# Patient Record
Sex: Female | Born: 1980 | Race: White | Hispanic: No | Marital: Single | Smoking: Former smoker
Health system: Southern US, Community
[De-identification: ages and names within clinical notes are randomized; demographics above are authoritative.]

## PROBLEM LIST (undated history)

## (undated) DIAGNOSIS — J302 Other seasonal allergic rhinitis: Secondary | ICD-10-CM

## (undated) DIAGNOSIS — J329 Chronic sinusitis, unspecified: Secondary | ICD-10-CM

## (undated) DIAGNOSIS — G8929 Other chronic pain: Secondary | ICD-10-CM

## (undated) DIAGNOSIS — N809 Endometriosis, unspecified: Secondary | ICD-10-CM

## (undated) DIAGNOSIS — E079 Disorder of thyroid, unspecified: Secondary | ICD-10-CM

## (undated) DIAGNOSIS — F419 Anxiety disorder, unspecified: Secondary | ICD-10-CM

## (undated) DIAGNOSIS — G4700 Insomnia, unspecified: Secondary | ICD-10-CM

## (undated) DIAGNOSIS — N3010 Interstitial cystitis (chronic) without hematuria: Secondary | ICD-10-CM

## (undated) DIAGNOSIS — I10 Essential (primary) hypertension: Secondary | ICD-10-CM

## (undated) DIAGNOSIS — F99 Mental disorder, not otherwise specified: Secondary | ICD-10-CM

## (undated) DIAGNOSIS — K219 Gastro-esophageal reflux disease without esophagitis: Secondary | ICD-10-CM

## (undated) DIAGNOSIS — F319 Bipolar disorder, unspecified: Secondary | ICD-10-CM

## (undated) DIAGNOSIS — F4310 Post-traumatic stress disorder, unspecified: Secondary | ICD-10-CM

## (undated) DIAGNOSIS — K589 Irritable bowel syndrome without diarrhea: Secondary | ICD-10-CM

## (undated) DIAGNOSIS — N643 Galactorrhea not associated with childbirth: Secondary | ICD-10-CM

## (undated) DIAGNOSIS — M549 Dorsalgia, unspecified: Secondary | ICD-10-CM

## (undated) HISTORY — DX: Irritable bowel syndrome, unspecified: K58.9

## (undated) HISTORY — PX: TUBAL LIGATION: SHX77

## (undated) HISTORY — PX: UPPER GI ENDOSCOPY: SHX6162

## (undated) HISTORY — PX: BUNIONECTOMY: SHX129

## (undated) HISTORY — DX: Endometriosis, unspecified: N80.9

## (undated) HISTORY — PX: ABDOMINAL SURGERY: SHX537

## (undated) HISTORY — DX: Post-traumatic stress disorder, unspecified: F43.10

## (undated) HISTORY — DX: Interstitial cystitis (chronic) without hematuria: N30.10

## (undated) HISTORY — PX: INTERSTIM IMPLANT PLACEMENT: SHX5130

## (undated) HISTORY — PX: BLADDER SURGERY: SHX569

## (undated) HISTORY — DX: Disorder of thyroid, unspecified: E07.9

## (undated) HISTORY — DX: Anxiety disorder, unspecified: F41.9

## (undated) HISTORY — PX: FOOT SURGERY: SHX648

## (undated) HISTORY — PX: INTRAUTERINE DEVICE INSERTION: SHX323

## (undated) HISTORY — PX: OOPHORECTOMY: SHX86

## (undated) HISTORY — DX: Mental disorder, not otherwise specified: F99

## (undated) HISTORY — DX: Galactorrhea not associated with childbirth: N64.3

---
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
## 1998-07-08 ENCOUNTER — Emergency Department (HOSPITAL_COMMUNITY): Admission: EM | Admit: 1998-07-08 | Discharge: 1998-07-08 | Payer: Self-pay | Admitting: Internal Medicine

## 1999-02-21 ENCOUNTER — Emergency Department (HOSPITAL_COMMUNITY): Admission: EM | Admit: 1999-02-21 | Discharge: 1999-02-21 | Payer: Self-pay | Admitting: *Deleted

## 1999-02-22 ENCOUNTER — Emergency Department (HOSPITAL_COMMUNITY): Admission: EM | Admit: 1999-02-22 | Discharge: 1999-02-22 | Payer: Self-pay | Admitting: Emergency Medicine

## 1999-03-21 ENCOUNTER — Emergency Department (HOSPITAL_COMMUNITY): Admission: EM | Admit: 1999-03-21 | Discharge: 1999-03-21 | Payer: Self-pay | Admitting: Internal Medicine

## 1999-04-13 ENCOUNTER — Ambulatory Visit (HOSPITAL_COMMUNITY): Admission: RE | Admit: 1999-04-13 | Discharge: 1999-04-13 | Payer: Self-pay | Admitting: Gastroenterology

## 1999-04-13 ENCOUNTER — Encounter (INDEPENDENT_AMBULATORY_CARE_PROVIDER_SITE_OTHER): Payer: Self-pay

## 1999-06-12 ENCOUNTER — Ambulatory Visit (HOSPITAL_COMMUNITY): Admission: RE | Admit: 1999-06-12 | Discharge: 1999-06-12 | Payer: Self-pay | Admitting: Gastroenterology

## 1999-06-12 ENCOUNTER — Encounter (INDEPENDENT_AMBULATORY_CARE_PROVIDER_SITE_OTHER): Payer: Self-pay | Admitting: Specialist

## 1999-08-05 ENCOUNTER — Encounter: Payer: Self-pay | Admitting: Physician Assistant

## 1999-08-09 ENCOUNTER — Emergency Department (HOSPITAL_COMMUNITY): Admission: EM | Admit: 1999-08-09 | Discharge: 1999-08-09 | Payer: Self-pay

## 1999-09-14 ENCOUNTER — Emergency Department (HOSPITAL_COMMUNITY): Admission: EM | Admit: 1999-09-14 | Discharge: 1999-09-14 | Payer: Self-pay | Admitting: Emergency Medicine

## 1999-09-21 ENCOUNTER — Encounter: Payer: Self-pay | Admitting: Urology

## 1999-09-21 ENCOUNTER — Encounter: Admission: RE | Admit: 1999-09-21 | Discharge: 1999-09-21 | Payer: Self-pay | Admitting: Urology

## 1999-11-23 ENCOUNTER — Inpatient Hospital Stay (HOSPITAL_COMMUNITY): Admission: AD | Admit: 1999-11-23 | Discharge: 1999-11-30 | Payer: Self-pay | Admitting: *Deleted

## 1999-12-17 ENCOUNTER — Encounter: Payer: Self-pay | Admitting: Family Medicine

## 1999-12-17 ENCOUNTER — Ambulatory Visit (HOSPITAL_COMMUNITY): Admission: RE | Admit: 1999-12-17 | Discharge: 1999-12-17 | Payer: Self-pay | Admitting: Family Medicine

## 1999-12-28 ENCOUNTER — Ambulatory Visit (HOSPITAL_COMMUNITY): Admission: RE | Admit: 1999-12-28 | Discharge: 1999-12-28 | Payer: Self-pay | Admitting: Neurology

## 1999-12-28 ENCOUNTER — Encounter: Payer: Self-pay | Admitting: Neurology

## 2000-01-07 ENCOUNTER — Emergency Department (HOSPITAL_COMMUNITY): Admission: EM | Admit: 2000-01-07 | Discharge: 2000-01-07 | Payer: Self-pay | Admitting: Emergency Medicine

## 2000-01-09 ENCOUNTER — Inpatient Hospital Stay (HOSPITAL_COMMUNITY): Admission: EM | Admit: 2000-01-09 | Discharge: 2000-01-12 | Payer: Self-pay | Admitting: Psychiatry

## 2000-04-03 ENCOUNTER — Inpatient Hospital Stay (HOSPITAL_COMMUNITY): Admission: EM | Admit: 2000-04-03 | Discharge: 2000-04-05 | Payer: Self-pay | Admitting: *Deleted

## 2000-04-25 ENCOUNTER — Encounter: Admission: RE | Admit: 2000-04-25 | Discharge: 2000-04-25 | Payer: Self-pay | Admitting: Oral Surgery

## 2000-04-25 ENCOUNTER — Encounter: Payer: Self-pay | Admitting: Oral Surgery

## 2000-04-26 ENCOUNTER — Ambulatory Visit (HOSPITAL_BASED_OUTPATIENT_CLINIC_OR_DEPARTMENT_OTHER): Admission: RE | Admit: 2000-04-26 | Discharge: 2000-04-26 | Payer: Self-pay | Admitting: *Deleted

## 2000-05-08 ENCOUNTER — Encounter: Payer: Self-pay | Admitting: Emergency Medicine

## 2000-05-08 ENCOUNTER — Emergency Department (HOSPITAL_COMMUNITY): Admission: EM | Admit: 2000-05-08 | Discharge: 2000-05-08 | Payer: Self-pay | Admitting: Emergency Medicine

## 2000-06-07 ENCOUNTER — Encounter: Payer: Self-pay | Admitting: Emergency Medicine

## 2000-06-07 ENCOUNTER — Inpatient Hospital Stay (HOSPITAL_COMMUNITY): Admission: EM | Admit: 2000-06-07 | Discharge: 2000-06-09 | Payer: Self-pay | Admitting: Emergency Medicine

## 2000-06-09 ENCOUNTER — Inpatient Hospital Stay (HOSPITAL_COMMUNITY): Admission: EM | Admit: 2000-06-09 | Discharge: 2000-06-14 | Payer: Self-pay | Admitting: *Deleted

## 2000-06-28 ENCOUNTER — Emergency Department (HOSPITAL_COMMUNITY): Admission: EM | Admit: 2000-06-28 | Discharge: 2000-06-28 | Payer: Self-pay | Admitting: Emergency Medicine

## 2000-07-06 ENCOUNTER — Emergency Department (HOSPITAL_COMMUNITY): Admission: EM | Admit: 2000-07-06 | Discharge: 2000-07-06 | Payer: Self-pay | Admitting: *Deleted

## 2000-07-13 ENCOUNTER — Encounter: Admission: RE | Admit: 2000-07-13 | Discharge: 2000-08-24 | Payer: Self-pay | Admitting: Family Medicine

## 2000-09-09 ENCOUNTER — Encounter: Payer: Self-pay | Admitting: Emergency Medicine

## 2000-09-09 ENCOUNTER — Emergency Department (HOSPITAL_COMMUNITY): Admission: EM | Admit: 2000-09-09 | Discharge: 2000-09-10 | Payer: Self-pay | Admitting: *Deleted

## 2001-01-22 ENCOUNTER — Emergency Department (HOSPITAL_COMMUNITY): Admission: EM | Admit: 2001-01-22 | Discharge: 2001-01-22 | Payer: Self-pay | Admitting: Emergency Medicine

## 2001-01-27 ENCOUNTER — Encounter: Admission: RE | Admit: 2001-01-27 | Discharge: 2001-01-27 | Payer: Self-pay | Admitting: Family Medicine

## 2001-04-01 ENCOUNTER — Emergency Department (HOSPITAL_COMMUNITY): Admission: EM | Admit: 2001-04-01 | Discharge: 2001-04-01 | Payer: Self-pay | Admitting: Emergency Medicine

## 2001-04-21 ENCOUNTER — Emergency Department (HOSPITAL_COMMUNITY): Admission: EM | Admit: 2001-04-21 | Discharge: 2001-04-22 | Payer: Self-pay | Admitting: Emergency Medicine

## 2001-05-06 ENCOUNTER — Emergency Department (HOSPITAL_COMMUNITY): Admission: EM | Admit: 2001-05-06 | Discharge: 2001-05-06 | Payer: Self-pay | Admitting: Emergency Medicine

## 2001-06-02 ENCOUNTER — Encounter: Payer: Self-pay | Admitting: Emergency Medicine

## 2001-06-02 ENCOUNTER — Emergency Department (HOSPITAL_COMMUNITY): Admission: EM | Admit: 2001-06-02 | Discharge: 2001-06-02 | Payer: Self-pay | Admitting: Emergency Medicine

## 2001-06-20 ENCOUNTER — Emergency Department (HOSPITAL_COMMUNITY): Admission: EM | Admit: 2001-06-20 | Discharge: 2001-06-21 | Payer: Self-pay | Admitting: *Deleted

## 2001-07-31 ENCOUNTER — Other Ambulatory Visit: Admission: RE | Admit: 2001-07-31 | Discharge: 2001-07-31 | Payer: Self-pay | Admitting: Gynecology

## 2001-09-03 ENCOUNTER — Emergency Department (HOSPITAL_COMMUNITY): Admission: EM | Admit: 2001-09-03 | Discharge: 2001-09-03 | Payer: Self-pay | Admitting: Emergency Medicine

## 2001-10-10 ENCOUNTER — Encounter: Admission: RE | Admit: 2001-10-10 | Discharge: 2001-10-10 | Payer: Self-pay | Admitting: Family Medicine

## 2001-10-10 ENCOUNTER — Encounter: Payer: Self-pay | Admitting: Family Medicine

## 2001-10-11 ENCOUNTER — Emergency Department (HOSPITAL_COMMUNITY): Admission: EM | Admit: 2001-10-11 | Discharge: 2001-10-11 | Payer: Self-pay | Admitting: Emergency Medicine

## 2001-10-18 ENCOUNTER — Inpatient Hospital Stay (HOSPITAL_COMMUNITY): Admission: EM | Admit: 2001-10-18 | Discharge: 2001-10-21 | Payer: Self-pay | Admitting: Emergency Medicine

## 2001-10-19 ENCOUNTER — Encounter: Payer: Self-pay | Admitting: Urology

## 2002-01-17 ENCOUNTER — Emergency Department (HOSPITAL_COMMUNITY): Admission: EM | Admit: 2002-01-17 | Discharge: 2002-01-17 | Payer: Self-pay | Admitting: *Deleted

## 2002-02-15 ENCOUNTER — Emergency Department (HOSPITAL_COMMUNITY): Admission: EM | Admit: 2002-02-15 | Discharge: 2002-02-15 | Payer: Self-pay | Admitting: Emergency Medicine

## 2002-02-19 ENCOUNTER — Encounter: Admission: RE | Admit: 2002-02-19 | Discharge: 2002-04-10 | Payer: Self-pay | Admitting: Anesthesiology

## 2002-03-29 ENCOUNTER — Emergency Department (HOSPITAL_COMMUNITY): Admission: EM | Admit: 2002-03-29 | Discharge: 2002-03-29 | Payer: Self-pay | Admitting: Emergency Medicine

## 2002-03-29 ENCOUNTER — Encounter: Payer: Self-pay | Admitting: Emergency Medicine

## 2002-04-06 ENCOUNTER — Emergency Department (HOSPITAL_COMMUNITY): Admission: EM | Admit: 2002-04-06 | Discharge: 2002-04-07 | Payer: Self-pay | Admitting: Emergency Medicine

## 2002-04-24 ENCOUNTER — Encounter: Admission: RE | Admit: 2002-04-24 | Discharge: 2002-05-21 | Payer: Self-pay | Admitting: Anesthesiology

## 2002-05-21 ENCOUNTER — Encounter: Admission: RE | Admit: 2002-05-21 | Discharge: 2002-08-19 | Payer: Self-pay | Admitting: Anesthesiology

## 2002-06-05 ENCOUNTER — Emergency Department (HOSPITAL_COMMUNITY): Admission: EM | Admit: 2002-06-05 | Discharge: 2002-06-05 | Payer: Self-pay | Admitting: Emergency Medicine

## 2002-07-21 ENCOUNTER — Emergency Department (HOSPITAL_COMMUNITY): Admission: EM | Admit: 2002-07-21 | Discharge: 2002-07-21 | Payer: Self-pay | Admitting: Emergency Medicine

## 2002-12-14 ENCOUNTER — Emergency Department (HOSPITAL_COMMUNITY): Admission: EM | Admit: 2002-12-14 | Discharge: 2002-12-14 | Payer: Self-pay | Admitting: Emergency Medicine

## 2003-01-24 ENCOUNTER — Emergency Department (HOSPITAL_COMMUNITY): Admission: EM | Admit: 2003-01-24 | Discharge: 2003-01-24 | Payer: Self-pay | Admitting: Emergency Medicine

## 2003-04-09 ENCOUNTER — Ambulatory Visit (HOSPITAL_COMMUNITY): Admission: RE | Admit: 2003-04-09 | Discharge: 2003-04-09 | Payer: Self-pay | Admitting: Obstetrics and Gynecology

## 2003-04-09 ENCOUNTER — Encounter: Payer: Self-pay | Admitting: Urology

## 2003-04-23 ENCOUNTER — Ambulatory Visit (HOSPITAL_COMMUNITY): Admission: RE | Admit: 2003-04-23 | Discharge: 2003-04-23 | Payer: Self-pay | Admitting: Urology

## 2003-04-23 ENCOUNTER — Ambulatory Visit (HOSPITAL_BASED_OUTPATIENT_CLINIC_OR_DEPARTMENT_OTHER): Admission: RE | Admit: 2003-04-23 | Discharge: 2003-04-23 | Payer: Self-pay | Admitting: Urology

## 2003-05-27 ENCOUNTER — Emergency Department (HOSPITAL_COMMUNITY): Admission: EM | Admit: 2003-05-27 | Discharge: 2003-05-28 | Payer: Self-pay | Admitting: Emergency Medicine

## 2003-05-30 ENCOUNTER — Emergency Department (HOSPITAL_COMMUNITY): Admission: EM | Admit: 2003-05-30 | Discharge: 2003-05-30 | Payer: Self-pay | Admitting: *Deleted

## 2003-08-14 ENCOUNTER — Emergency Department (HOSPITAL_COMMUNITY): Admission: EM | Admit: 2003-08-14 | Discharge: 2003-08-14 | Payer: Self-pay | Admitting: Emergency Medicine

## 2003-08-19 ENCOUNTER — Emergency Department (HOSPITAL_COMMUNITY): Admission: EM | Admit: 2003-08-19 | Discharge: 2003-08-19 | Payer: Self-pay | Admitting: Emergency Medicine

## 2003-10-18 ENCOUNTER — Emergency Department (HOSPITAL_COMMUNITY): Admission: EM | Admit: 2003-10-18 | Discharge: 2003-10-18 | Payer: Self-pay | Admitting: Emergency Medicine

## 2003-10-30 ENCOUNTER — Emergency Department (HOSPITAL_COMMUNITY): Admission: EM | Admit: 2003-10-30 | Discharge: 2003-10-31 | Payer: Self-pay | Admitting: Emergency Medicine

## 2003-12-11 ENCOUNTER — Emergency Department (HOSPITAL_COMMUNITY): Admission: EM | Admit: 2003-12-11 | Discharge: 2003-12-11 | Payer: Self-pay | Admitting: Emergency Medicine

## 2004-03-23 ENCOUNTER — Ambulatory Visit: Payer: Self-pay | Admitting: Family Medicine

## 2004-05-17 ENCOUNTER — Emergency Department (HOSPITAL_COMMUNITY): Admission: EM | Admit: 2004-05-17 | Discharge: 2004-05-17 | Payer: Self-pay | Admitting: Emergency Medicine

## 2004-05-17 ENCOUNTER — Encounter (INDEPENDENT_AMBULATORY_CARE_PROVIDER_SITE_OTHER): Payer: Self-pay | Admitting: *Deleted

## 2004-06-01 ENCOUNTER — Ambulatory Visit: Payer: Self-pay | Admitting: Internal Medicine

## 2004-06-26 ENCOUNTER — Emergency Department (HOSPITAL_COMMUNITY): Admission: EM | Admit: 2004-06-26 | Discharge: 2004-06-26 | Payer: Self-pay | Admitting: Emergency Medicine

## 2004-07-14 ENCOUNTER — Ambulatory Visit: Payer: Self-pay | Admitting: Family Medicine

## 2004-08-06 ENCOUNTER — Ambulatory Visit: Payer: Self-pay | Admitting: Family Medicine

## 2004-09-03 ENCOUNTER — Ambulatory Visit: Payer: Self-pay | Admitting: Family Medicine

## 2004-10-08 ENCOUNTER — Ambulatory Visit: Payer: Self-pay | Admitting: Family Medicine

## 2004-10-22 ENCOUNTER — Ambulatory Visit: Payer: Self-pay | Admitting: Family Medicine

## 2004-10-27 ENCOUNTER — Emergency Department (HOSPITAL_COMMUNITY): Admission: EM | Admit: 2004-10-27 | Discharge: 2004-10-27 | Payer: Self-pay | Admitting: *Deleted

## 2004-11-16 ENCOUNTER — Ambulatory Visit: Payer: Self-pay | Admitting: Family Medicine

## 2004-12-08 ENCOUNTER — Ambulatory Visit: Payer: Self-pay | Admitting: Family Medicine

## 2004-12-17 ENCOUNTER — Ambulatory Visit: Payer: Self-pay | Admitting: Family Medicine

## 2005-01-21 ENCOUNTER — Ambulatory Visit: Payer: Self-pay | Admitting: Family Medicine

## 2005-02-11 ENCOUNTER — Ambulatory Visit: Payer: Self-pay | Admitting: Family Medicine

## 2005-03-09 ENCOUNTER — Ambulatory Visit (HOSPITAL_COMMUNITY): Admission: RE | Admit: 2005-03-09 | Discharge: 2005-03-09 | Payer: Self-pay | Admitting: Urology

## 2005-03-09 ENCOUNTER — Ambulatory Visit (HOSPITAL_BASED_OUTPATIENT_CLINIC_OR_DEPARTMENT_OTHER): Admission: RE | Admit: 2005-03-09 | Discharge: 2005-03-09 | Payer: Self-pay | Admitting: Urology

## 2005-04-19 ENCOUNTER — Emergency Department (HOSPITAL_COMMUNITY): Admission: EM | Admit: 2005-04-19 | Discharge: 2005-04-20 | Payer: Self-pay | Admitting: Emergency Medicine

## 2005-04-29 ENCOUNTER — Ambulatory Visit: Payer: Self-pay | Admitting: Family Medicine

## 2005-05-14 ENCOUNTER — Emergency Department (HOSPITAL_COMMUNITY): Admission: EM | Admit: 2005-05-14 | Discharge: 2005-05-14 | Payer: Self-pay | Admitting: Emergency Medicine

## 2005-05-20 ENCOUNTER — Ambulatory Visit: Payer: Self-pay | Admitting: Family Medicine

## 2005-05-27 ENCOUNTER — Ambulatory Visit: Payer: Self-pay | Admitting: Family Medicine

## 2005-07-15 ENCOUNTER — Ambulatory Visit: Payer: Self-pay | Admitting: Family Medicine

## 2005-07-22 ENCOUNTER — Ambulatory Visit: Payer: Self-pay | Admitting: Family Medicine

## 2005-07-22 ENCOUNTER — Other Ambulatory Visit: Admission: RE | Admit: 2005-07-22 | Discharge: 2005-07-22 | Payer: Self-pay | Admitting: Family Medicine

## 2005-07-22 ENCOUNTER — Encounter: Payer: Self-pay | Admitting: Family Medicine

## 2005-08-12 ENCOUNTER — Encounter (INDEPENDENT_AMBULATORY_CARE_PROVIDER_SITE_OTHER): Payer: Self-pay | Admitting: Family Medicine

## 2005-08-12 LAB — CONVERTED CEMR LAB: Pap Smear: NORMAL

## 2005-08-13 ENCOUNTER — Ambulatory Visit (HOSPITAL_COMMUNITY): Admission: RE | Admit: 2005-08-13 | Discharge: 2005-08-13 | Payer: Self-pay | Admitting: Surgery

## 2005-08-31 ENCOUNTER — Emergency Department (HOSPITAL_COMMUNITY): Admission: EM | Admit: 2005-08-31 | Discharge: 2005-09-01 | Payer: Self-pay | Admitting: Emergency Medicine

## 2005-10-10 ENCOUNTER — Emergency Department (HOSPITAL_COMMUNITY): Admission: EM | Admit: 2005-10-10 | Discharge: 2005-10-10 | Payer: Self-pay | Admitting: Family Medicine

## 2005-10-19 ENCOUNTER — Emergency Department (HOSPITAL_COMMUNITY): Admission: EM | Admit: 2005-10-19 | Discharge: 2005-10-19 | Payer: Self-pay | Admitting: Family Medicine

## 2005-12-30 ENCOUNTER — Ambulatory Visit: Payer: Self-pay | Admitting: Family Medicine

## 2006-02-27 ENCOUNTER — Emergency Department (HOSPITAL_COMMUNITY): Admission: AD | Admit: 2006-02-27 | Discharge: 2006-02-27 | Payer: Self-pay | Admitting: Family Medicine

## 2006-03-12 ENCOUNTER — Emergency Department (HOSPITAL_COMMUNITY): Admission: EM | Admit: 2006-03-12 | Discharge: 2006-03-12 | Payer: Self-pay | Admitting: Family Medicine

## 2006-03-28 ENCOUNTER — Emergency Department (HOSPITAL_COMMUNITY): Admission: EM | Admit: 2006-03-28 | Discharge: 2006-03-28 | Payer: Self-pay | Admitting: Emergency Medicine

## 2006-04-28 ENCOUNTER — Ambulatory Visit: Payer: Self-pay | Admitting: Family Medicine

## 2006-05-30 ENCOUNTER — Emergency Department (HOSPITAL_COMMUNITY): Admission: EM | Admit: 2006-05-30 | Discharge: 2006-05-30 | Payer: Self-pay | Admitting: Family Medicine

## 2006-06-09 ENCOUNTER — Ambulatory Visit: Payer: Self-pay | Admitting: Family Medicine

## 2006-07-14 ENCOUNTER — Emergency Department (HOSPITAL_COMMUNITY): Admission: EM | Admit: 2006-07-14 | Discharge: 2006-07-15 | Payer: Self-pay | Admitting: Emergency Medicine

## 2006-07-14 ENCOUNTER — Ambulatory Visit: Payer: Self-pay | Admitting: Internal Medicine

## 2006-08-24 ENCOUNTER — Emergency Department (HOSPITAL_COMMUNITY): Admission: EM | Admit: 2006-08-24 | Discharge: 2006-08-24 | Payer: Self-pay | Admitting: Emergency Medicine

## 2006-09-04 ENCOUNTER — Emergency Department (HOSPITAL_COMMUNITY): Admission: EM | Admit: 2006-09-04 | Discharge: 2006-09-04 | Payer: Self-pay | Admitting: Emergency Medicine

## 2006-10-06 ENCOUNTER — Ambulatory Visit (HOSPITAL_COMMUNITY): Payer: Self-pay | Admitting: Psychiatry

## 2006-10-27 ENCOUNTER — Ambulatory Visit (HOSPITAL_COMMUNITY): Payer: Self-pay | Admitting: Psychiatry

## 2006-11-03 ENCOUNTER — Ambulatory Visit: Payer: Self-pay | Admitting: Internal Medicine

## 2006-11-12 ENCOUNTER — Emergency Department (HOSPITAL_COMMUNITY): Admission: EM | Admit: 2006-11-12 | Discharge: 2006-11-12 | Payer: Self-pay | Admitting: Family Medicine

## 2006-11-22 ENCOUNTER — Ambulatory Visit: Payer: Self-pay | Admitting: Family Medicine

## 2006-11-24 ENCOUNTER — Ambulatory Visit (HOSPITAL_COMMUNITY): Payer: Self-pay | Admitting: Psychiatry

## 2006-12-08 ENCOUNTER — Ambulatory Visit (HOSPITAL_COMMUNITY): Payer: Self-pay | Admitting: Psychiatry

## 2006-12-22 ENCOUNTER — Ambulatory Visit (HOSPITAL_COMMUNITY): Payer: Self-pay | Admitting: Psychiatry

## 2007-01-04 ENCOUNTER — Emergency Department (HOSPITAL_COMMUNITY): Admission: EM | Admit: 2007-01-04 | Discharge: 2007-01-04 | Payer: Self-pay | Admitting: Emergency Medicine

## 2007-01-16 ENCOUNTER — Ambulatory Visit: Payer: Self-pay | Admitting: Family Medicine

## 2007-01-19 ENCOUNTER — Ambulatory Visit (HOSPITAL_COMMUNITY): Payer: Self-pay | Admitting: Psychiatry

## 2007-01-21 ENCOUNTER — Emergency Department (HOSPITAL_COMMUNITY): Admission: EM | Admit: 2007-01-21 | Discharge: 2007-01-22 | Payer: Self-pay | Admitting: Family Medicine

## 2007-02-06 ENCOUNTER — Emergency Department (HOSPITAL_COMMUNITY): Admission: EM | Admit: 2007-02-06 | Discharge: 2007-02-06 | Payer: Self-pay | Admitting: Emergency Medicine

## 2007-02-21 ENCOUNTER — Emergency Department (HOSPITAL_COMMUNITY): Admission: EM | Admit: 2007-02-21 | Discharge: 2007-02-21 | Payer: Self-pay | Admitting: Emergency Medicine

## 2007-02-23 ENCOUNTER — Ambulatory Visit: Payer: Self-pay | Admitting: Family Medicine

## 2007-03-06 ENCOUNTER — Telehealth (INDEPENDENT_AMBULATORY_CARE_PROVIDER_SITE_OTHER): Payer: Self-pay | Admitting: *Deleted

## 2007-03-07 ENCOUNTER — Telehealth (INDEPENDENT_AMBULATORY_CARE_PROVIDER_SITE_OTHER): Payer: Self-pay | Admitting: *Deleted

## 2007-03-07 ENCOUNTER — Encounter (INDEPENDENT_AMBULATORY_CARE_PROVIDER_SITE_OTHER): Payer: Self-pay | Admitting: Family Medicine

## 2007-03-07 DIAGNOSIS — F3189 Other bipolar disorder: Secondary | ICD-10-CM | POA: Insufficient documentation

## 2007-03-07 DIAGNOSIS — IMO0001 Reserved for inherently not codable concepts without codable children: Secondary | ICD-10-CM | POA: Insufficient documentation

## 2007-03-07 DIAGNOSIS — F429 Obsessive-compulsive disorder, unspecified: Secondary | ICD-10-CM | POA: Insufficient documentation

## 2007-03-07 DIAGNOSIS — IMO0002 Reserved for concepts with insufficient information to code with codable children: Secondary | ICD-10-CM | POA: Insufficient documentation

## 2007-03-07 DIAGNOSIS — N3010 Interstitial cystitis (chronic) without hematuria: Secondary | ICD-10-CM | POA: Insufficient documentation

## 2007-03-07 DIAGNOSIS — N760 Acute vaginitis: Secondary | ICD-10-CM | POA: Insufficient documentation

## 2007-03-07 DIAGNOSIS — G8929 Other chronic pain: Secondary | ICD-10-CM | POA: Insufficient documentation

## 2007-03-07 DIAGNOSIS — N912 Amenorrhea, unspecified: Secondary | ICD-10-CM | POA: Insufficient documentation

## 2007-03-08 ENCOUNTER — Encounter (INDEPENDENT_AMBULATORY_CARE_PROVIDER_SITE_OTHER): Payer: Self-pay | Admitting: Family Medicine

## 2007-03-08 ENCOUNTER — Ambulatory Visit: Payer: Self-pay | Admitting: Family Medicine

## 2007-03-08 ENCOUNTER — Encounter: Payer: Self-pay | Admitting: Family Medicine

## 2007-03-08 DIAGNOSIS — L293 Anogenital pruritus, unspecified: Secondary | ICD-10-CM | POA: Insufficient documentation

## 2007-03-14 ENCOUNTER — Ambulatory Visit (HOSPITAL_BASED_OUTPATIENT_CLINIC_OR_DEPARTMENT_OTHER): Admission: RE | Admit: 2007-03-14 | Discharge: 2007-03-14 | Payer: Self-pay | Admitting: Urology

## 2007-03-30 ENCOUNTER — Ambulatory Visit (HOSPITAL_COMMUNITY): Payer: Self-pay | Admitting: Psychiatry

## 2007-04-06 ENCOUNTER — Telehealth (INDEPENDENT_AMBULATORY_CARE_PROVIDER_SITE_OTHER): Payer: Self-pay | Admitting: *Deleted

## 2007-04-18 ENCOUNTER — Emergency Department (HOSPITAL_COMMUNITY): Admission: EM | Admit: 2007-04-18 | Discharge: 2007-04-18 | Payer: Self-pay | Admitting: Emergency Medicine

## 2007-04-18 ENCOUNTER — Telehealth (INDEPENDENT_AMBULATORY_CARE_PROVIDER_SITE_OTHER): Payer: Self-pay | Admitting: *Deleted

## 2007-04-20 ENCOUNTER — Ambulatory Visit: Payer: Self-pay | Admitting: Nurse Practitioner

## 2007-04-20 DIAGNOSIS — L0233 Carbuncle of buttock: Secondary | ICD-10-CM | POA: Insufficient documentation

## 2007-04-25 ENCOUNTER — Telehealth (INDEPENDENT_AMBULATORY_CARE_PROVIDER_SITE_OTHER): Payer: Self-pay | Admitting: Nurse Practitioner

## 2007-04-26 ENCOUNTER — Telehealth (INDEPENDENT_AMBULATORY_CARE_PROVIDER_SITE_OTHER): Payer: Self-pay | Admitting: Nurse Practitioner

## 2007-05-11 ENCOUNTER — Ambulatory Visit (HOSPITAL_COMMUNITY): Payer: Self-pay | Admitting: Psychiatry

## 2007-06-01 ENCOUNTER — Ambulatory Visit (HOSPITAL_COMMUNITY): Payer: Self-pay | Admitting: Psychiatry

## 2007-06-01 ENCOUNTER — Emergency Department (HOSPITAL_COMMUNITY): Admission: EM | Admit: 2007-06-01 | Discharge: 2007-06-01 | Payer: Self-pay | Admitting: Emergency Medicine

## 2007-06-22 ENCOUNTER — Telehealth (INDEPENDENT_AMBULATORY_CARE_PROVIDER_SITE_OTHER): Payer: Self-pay | Admitting: Family Medicine

## 2007-06-26 ENCOUNTER — Encounter (INDEPENDENT_AMBULATORY_CARE_PROVIDER_SITE_OTHER): Payer: Self-pay | Admitting: Family Medicine

## 2007-06-29 ENCOUNTER — Ambulatory Visit (HOSPITAL_COMMUNITY): Payer: Self-pay | Admitting: Psychiatry

## 2007-07-06 ENCOUNTER — Other Ambulatory Visit: Payer: Self-pay | Admitting: Emergency Medicine

## 2007-07-06 ENCOUNTER — Ambulatory Visit: Payer: Self-pay | Admitting: *Deleted

## 2007-07-06 ENCOUNTER — Inpatient Hospital Stay (HOSPITAL_COMMUNITY): Admission: AD | Admit: 2007-07-06 | Discharge: 2007-07-10 | Payer: Self-pay | Admitting: *Deleted

## 2007-07-18 ENCOUNTER — Other Ambulatory Visit (HOSPITAL_COMMUNITY): Admission: RE | Admit: 2007-07-18 | Discharge: 2007-08-02 | Payer: Self-pay | Admitting: Psychiatry

## 2007-07-18 ENCOUNTER — Ambulatory Visit: Payer: Self-pay | Admitting: Psychiatry

## 2007-08-03 ENCOUNTER — Ambulatory Visit (HOSPITAL_COMMUNITY): Payer: Self-pay | Admitting: Psychiatry

## 2007-08-18 ENCOUNTER — Emergency Department (HOSPITAL_COMMUNITY): Admission: EM | Admit: 2007-08-18 | Discharge: 2007-08-18 | Payer: Self-pay | Admitting: Emergency Medicine

## 2007-09-28 ENCOUNTER — Ambulatory Visit (HOSPITAL_COMMUNITY): Payer: Self-pay | Admitting: Psychiatry

## 2007-10-19 ENCOUNTER — Encounter: Admission: RE | Admit: 2007-10-19 | Discharge: 2007-10-19 | Payer: Self-pay | Admitting: Obstetrics and Gynecology

## 2007-11-01 ENCOUNTER — Ambulatory Visit (HOSPITAL_COMMUNITY): Payer: Self-pay | Admitting: Psychiatry

## 2007-11-29 ENCOUNTER — Ambulatory Visit (HOSPITAL_COMMUNITY): Payer: Self-pay | Admitting: Psychiatry

## 2008-01-04 ENCOUNTER — Ambulatory Visit (HOSPITAL_COMMUNITY): Payer: Self-pay | Admitting: Psychiatry

## 2008-01-30 ENCOUNTER — Ambulatory Visit (HOSPITAL_BASED_OUTPATIENT_CLINIC_OR_DEPARTMENT_OTHER): Admission: RE | Admit: 2008-01-30 | Discharge: 2008-01-30 | Payer: Self-pay | Admitting: Urology

## 2008-02-05 ENCOUNTER — Telehealth (INDEPENDENT_AMBULATORY_CARE_PROVIDER_SITE_OTHER): Payer: Self-pay | Admitting: *Deleted

## 2008-02-05 DIAGNOSIS — L708 Other acne: Secondary | ICD-10-CM | POA: Insufficient documentation

## 2008-03-26 ENCOUNTER — Ambulatory Visit (HOSPITAL_COMMUNITY): Payer: Self-pay | Admitting: Psychiatry

## 2008-03-27 ENCOUNTER — Emergency Department (HOSPITAL_COMMUNITY): Admission: EM | Admit: 2008-03-27 | Discharge: 2008-03-27 | Payer: Self-pay | Admitting: Emergency Medicine

## 2008-04-01 ENCOUNTER — Ambulatory Visit: Payer: Self-pay | Admitting: Family Medicine

## 2008-04-01 DIAGNOSIS — N390 Urinary tract infection, site not specified: Secondary | ICD-10-CM | POA: Insufficient documentation

## 2008-04-01 LAB — CONVERTED CEMR LAB
Bilirubin Urine: NEGATIVE
Blood in Urine, dipstick: NEGATIVE
Glucose, Urine, Semiquant: NEGATIVE
Ketones, urine, test strip: NEGATIVE
Nitrite: NEGATIVE
Protein, U semiquant: NEGATIVE
Specific Gravity, Urine: >=1.03
Urobilinogen, UA: 0.2
WBC Urine, dipstick: NEGATIVE
pH: 5

## 2008-04-02 ENCOUNTER — Encounter (INDEPENDENT_AMBULATORY_CARE_PROVIDER_SITE_OTHER): Payer: Self-pay | Admitting: Family Medicine

## 2008-04-04 ENCOUNTER — Encounter (INDEPENDENT_AMBULATORY_CARE_PROVIDER_SITE_OTHER): Payer: Self-pay | Admitting: *Deleted

## 2008-04-12 ENCOUNTER — Ambulatory Visit (HOSPITAL_BASED_OUTPATIENT_CLINIC_OR_DEPARTMENT_OTHER): Admission: RE | Admit: 2008-04-12 | Discharge: 2008-04-12 | Payer: Self-pay | Admitting: Urology

## 2008-05-08 ENCOUNTER — Emergency Department (HOSPITAL_COMMUNITY): Admission: EM | Admit: 2008-05-08 | Discharge: 2008-05-08 | Payer: Self-pay | Admitting: Emergency Medicine

## 2008-05-09 ENCOUNTER — Emergency Department (HOSPITAL_COMMUNITY): Admission: EM | Admit: 2008-05-09 | Discharge: 2008-05-10 | Payer: Self-pay | Admitting: Emergency Medicine

## 2008-05-20 ENCOUNTER — Encounter (INDEPENDENT_AMBULATORY_CARE_PROVIDER_SITE_OTHER): Payer: Self-pay | Admitting: *Deleted

## 2008-05-27 ENCOUNTER — Emergency Department (HOSPITAL_COMMUNITY): Admission: EM | Admit: 2008-05-27 | Discharge: 2008-05-27 | Payer: Self-pay | Admitting: Emergency Medicine

## 2008-05-27 ENCOUNTER — Encounter (INDEPENDENT_AMBULATORY_CARE_PROVIDER_SITE_OTHER): Payer: Self-pay | Admitting: *Deleted

## 2008-07-10 DIAGNOSIS — R110 Nausea: Secondary | ICD-10-CM | POA: Insufficient documentation

## 2008-07-10 DIAGNOSIS — R12 Heartburn: Secondary | ICD-10-CM | POA: Insufficient documentation

## 2008-07-10 DIAGNOSIS — K589 Irritable bowel syndrome without diarrhea: Secondary | ICD-10-CM | POA: Insufficient documentation

## 2008-07-15 ENCOUNTER — Ambulatory Visit: Payer: Self-pay | Admitting: Internal Medicine

## 2008-07-16 ENCOUNTER — Ambulatory Visit (HOSPITAL_COMMUNITY): Admission: RE | Admit: 2008-07-16 | Discharge: 2008-07-16 | Payer: Self-pay | Admitting: Internal Medicine

## 2008-07-25 ENCOUNTER — Encounter: Payer: Self-pay | Admitting: Internal Medicine

## 2008-07-25 ENCOUNTER — Ambulatory Visit: Payer: Self-pay | Admitting: Internal Medicine

## 2008-07-27 ENCOUNTER — Encounter: Payer: Self-pay | Admitting: Internal Medicine

## 2008-08-17 ENCOUNTER — Emergency Department (HOSPITAL_COMMUNITY): Admission: EM | Admit: 2008-08-17 | Discharge: 2008-08-17 | Payer: Self-pay | Admitting: Emergency Medicine

## 2008-09-07 ENCOUNTER — Emergency Department (HOSPITAL_COMMUNITY): Admission: EM | Admit: 2008-09-07 | Discharge: 2008-09-07 | Payer: Self-pay | Admitting: *Deleted

## 2008-09-27 ENCOUNTER — Telehealth (INDEPENDENT_AMBULATORY_CARE_PROVIDER_SITE_OTHER): Payer: Self-pay | Admitting: Family Medicine

## 2008-10-04 ENCOUNTER — Emergency Department (HOSPITAL_COMMUNITY): Admission: EM | Admit: 2008-10-04 | Discharge: 2008-10-04 | Payer: Self-pay | Admitting: Emergency Medicine

## 2008-10-09 ENCOUNTER — Emergency Department (HOSPITAL_COMMUNITY): Admission: EM | Admit: 2008-10-09 | Discharge: 2008-10-09 | Payer: Self-pay | Admitting: Emergency Medicine

## 2008-10-14 ENCOUNTER — Encounter (INDEPENDENT_AMBULATORY_CARE_PROVIDER_SITE_OTHER): Payer: Self-pay | Admitting: Family Medicine

## 2008-10-15 ENCOUNTER — Emergency Department (HOSPITAL_COMMUNITY): Admission: EM | Admit: 2008-10-15 | Discharge: 2008-10-16 | Payer: Self-pay | Admitting: Emergency Medicine

## 2008-10-20 ENCOUNTER — Emergency Department (HOSPITAL_COMMUNITY): Admission: EM | Admit: 2008-10-20 | Discharge: 2008-10-21 | Payer: Self-pay | Admitting: Emergency Medicine

## 2008-11-02 ENCOUNTER — Emergency Department (HOSPITAL_COMMUNITY): Admission: EM | Admit: 2008-11-02 | Discharge: 2008-11-02 | Payer: Self-pay | Admitting: Emergency Medicine

## 2008-11-07 ENCOUNTER — Emergency Department (HOSPITAL_COMMUNITY): Admission: EM | Admit: 2008-11-07 | Discharge: 2008-11-07 | Payer: Self-pay | Admitting: Emergency Medicine

## 2008-11-24 ENCOUNTER — Emergency Department (HOSPITAL_COMMUNITY): Admission: EM | Admit: 2008-11-24 | Discharge: 2008-11-24 | Payer: Self-pay | Admitting: Emergency Medicine

## 2008-12-28 ENCOUNTER — Emergency Department (HOSPITAL_COMMUNITY): Admission: EM | Admit: 2008-12-28 | Discharge: 2008-12-28 | Payer: Self-pay | Admitting: Emergency Medicine

## 2009-05-11 ENCOUNTER — Emergency Department (HOSPITAL_COMMUNITY): Admission: EM | Admit: 2009-05-11 | Discharge: 2009-05-11 | Payer: Self-pay | Admitting: Emergency Medicine

## 2009-05-27 ENCOUNTER — Inpatient Hospital Stay (HOSPITAL_COMMUNITY): Admission: EM | Admit: 2009-05-27 | Discharge: 2009-05-30 | Payer: Self-pay | Admitting: Emergency Medicine

## 2009-05-28 ENCOUNTER — Other Ambulatory Visit: Payer: Self-pay | Admitting: Internal Medicine

## 2009-06-03 ENCOUNTER — Encounter: Payer: Self-pay | Admitting: Physician Assistant

## 2009-07-13 ENCOUNTER — Emergency Department (HOSPITAL_COMMUNITY): Admission: EM | Admit: 2009-07-13 | Discharge: 2009-07-13 | Payer: Self-pay | Admitting: Emergency Medicine

## 2009-11-06 ENCOUNTER — Emergency Department (HOSPITAL_COMMUNITY): Admission: EM | Admit: 2009-11-06 | Discharge: 2009-11-07 | Payer: Self-pay | Admitting: Emergency Medicine

## 2010-01-18 ENCOUNTER — Other Ambulatory Visit: Payer: Self-pay | Admitting: Emergency Medicine

## 2010-01-18 ENCOUNTER — Ambulatory Visit (HOSPITAL_COMMUNITY): Admission: RE | Admit: 2010-01-18 | Discharge: 2010-01-18 | Payer: Self-pay | Admitting: Psychiatry

## 2010-01-19 ENCOUNTER — Ambulatory Visit: Payer: Self-pay | Admitting: Psychiatry

## 2010-01-19 ENCOUNTER — Inpatient Hospital Stay (HOSPITAL_COMMUNITY): Admission: RE | Admit: 2010-01-19 | Discharge: 2010-01-22 | Payer: Self-pay | Admitting: Psychiatry

## 2010-01-26 ENCOUNTER — Emergency Department (HOSPITAL_COMMUNITY): Admission: EM | Admit: 2010-01-26 | Discharge: 2010-01-26 | Payer: Self-pay | Admitting: Emergency Medicine

## 2010-01-27 ENCOUNTER — Emergency Department (HOSPITAL_COMMUNITY): Admission: EM | Admit: 2010-01-27 | Discharge: 2010-01-27 | Payer: Self-pay | Admitting: Emergency Medicine

## 2010-01-29 ENCOUNTER — Emergency Department (HOSPITAL_COMMUNITY): Admission: EM | Admit: 2010-01-29 | Discharge: 2010-01-29 | Payer: Self-pay | Admitting: Emergency Medicine

## 2010-03-10 ENCOUNTER — Emergency Department (HOSPITAL_COMMUNITY): Admission: EM | Admit: 2010-03-10 | Discharge: 2010-03-10 | Payer: Self-pay | Admitting: Emergency Medicine

## 2010-03-14 ENCOUNTER — Emergency Department (HOSPITAL_COMMUNITY): Admission: EM | Admit: 2010-03-14 | Discharge: 2010-03-15 | Payer: Self-pay | Admitting: Emergency Medicine

## 2010-03-26 ENCOUNTER — Emergency Department (HOSPITAL_COMMUNITY): Admission: EM | Admit: 2010-03-26 | Discharge: 2010-03-26 | Payer: Self-pay | Admitting: Emergency Medicine

## 2010-03-31 ENCOUNTER — Emergency Department (HOSPITAL_COMMUNITY): Admission: EM | Admit: 2010-03-31 | Discharge: 2010-04-01 | Payer: Self-pay | Admitting: Emergency Medicine

## 2010-04-09 ENCOUNTER — Emergency Department (HOSPITAL_COMMUNITY): Admission: EM | Admit: 2010-04-09 | Discharge: 2010-04-09 | Payer: Self-pay | Admitting: Emergency Medicine

## 2010-04-27 ENCOUNTER — Emergency Department (HOSPITAL_COMMUNITY): Admission: EM | Admit: 2010-04-27 | Discharge: 2010-04-27 | Payer: Self-pay | Admitting: Emergency Medicine

## 2010-05-10 ENCOUNTER — Emergency Department (HOSPITAL_COMMUNITY): Admission: EM | Admit: 2010-05-10 | Discharge: 2010-05-10 | Payer: Self-pay | Admitting: Emergency Medicine

## 2010-06-07 ENCOUNTER — Emergency Department (HOSPITAL_COMMUNITY): Admission: EM | Admit: 2010-06-07 | Discharge: 2010-06-07 | Payer: Self-pay | Admitting: Emergency Medicine

## 2010-06-26 ENCOUNTER — Emergency Department (HOSPITAL_COMMUNITY)
Admission: EM | Admit: 2010-06-26 | Discharge: 2010-06-26 | Payer: Self-pay | Source: Home / Self Care | Admitting: Emergency Medicine

## 2010-07-11 ENCOUNTER — Emergency Department (HOSPITAL_COMMUNITY)
Admission: EM | Admit: 2010-07-11 | Discharge: 2010-07-11 | Payer: Self-pay | Source: Home / Self Care | Admitting: Emergency Medicine

## 2010-07-16 ENCOUNTER — Emergency Department (HOSPITAL_BASED_OUTPATIENT_CLINIC_OR_DEPARTMENT_OTHER)
Admission: EM | Admit: 2010-07-16 | Discharge: 2010-07-16 | Payer: Self-pay | Source: Home / Self Care | Admitting: Emergency Medicine

## 2010-07-16 LAB — URINALYSIS, ROUTINE W REFLEX MICROSCOPIC
Bilirubin Urine: NEGATIVE
Hemoglobin, Urine: NEGATIVE
Ketones, ur: NEGATIVE mg/dL
Nitrite: NEGATIVE
Protein, ur: NEGATIVE mg/dL
Specific Gravity, Urine: 1.02 (ref 1.005–1.030)
Urine Glucose, Fasting: NEGATIVE mg/dL
Urobilinogen, UA: 1 mg/dL (ref 0.0–1.0)
pH: 6 (ref 5.0–8.0)

## 2010-07-16 LAB — PREGNANCY, URINE: Preg Test, Ur: NEGATIVE

## 2010-07-23 ENCOUNTER — Emergency Department (HOSPITAL_COMMUNITY)
Admission: EM | Admit: 2010-07-23 | Discharge: 2010-07-23 | Payer: Self-pay | Source: Home / Self Care | Admitting: Emergency Medicine

## 2010-07-24 ENCOUNTER — Emergency Department (HOSPITAL_COMMUNITY)
Admission: EM | Admit: 2010-07-24 | Discharge: 2010-07-24 | Payer: Self-pay | Source: Home / Self Care | Admitting: Emergency Medicine

## 2010-07-27 LAB — URINALYSIS, ROUTINE W REFLEX MICROSCOPIC
Bilirubin Urine: NEGATIVE
Bilirubin Urine: NEGATIVE
Hgb urine dipstick: NEGATIVE
Hgb urine dipstick: NEGATIVE
Ketones, ur: NEGATIVE mg/dL
Nitrite: NEGATIVE
Nitrite: NEGATIVE
Protein, ur: NEGATIVE mg/dL
Protein, ur: NEGATIVE mg/dL
Specific Gravity, Urine: 1.042 — ABNORMAL HIGH (ref 1.005–1.030)
Specific Gravity, Urine: 1.035 — ABNORMAL HIGH (ref 1.005–1.030)
Urine Glucose, Fasting: NEGATIVE mg/dL
Urine Glucose, Fasting: NEGATIVE mg/dL
Urobilinogen, UA: 0.2 mg/dL (ref 0.0–1.0)
Urobilinogen, UA: 0.2 mg/dL (ref 0.0–1.0)
pH: 5.5 (ref 5.0–8.0)
pH: 5.5 (ref 5.0–8.0)

## 2010-07-27 LAB — URINE CULTURE
Colony Count: 4000
Culture  Setup Time: 201201122344

## 2010-07-27 LAB — POCT I-STAT, CHEM 8
BUN: 20 mg/dL (ref 6–23)
Calcium, Ion: 1.22 mmol/L (ref 1.12–1.32)
Chloride: 105 mEq/L (ref 96–112)
Creatinine, Ser: 1 mg/dL (ref 0.4–1.2)
Glucose, Bld: 98 mg/dL (ref 70–99)
HCT: 43 % (ref 36.0–46.0)
Hemoglobin: 14.6 g/dL (ref 12.0–15.0)
Potassium: 3.9 mEq/L (ref 3.5–5.1)
Sodium: 140 mEq/L (ref 135–145)
TCO2: 26 mmol/L (ref 0–100)

## 2010-07-27 LAB — POCT PREGNANCY, URINE: Preg Test, Ur: NEGATIVE

## 2010-08-13 NOTE — Procedures (Signed)
Summary: EGD   EGD  Procedure date:  07/25/2008  Findings:      Location: [facility]    ENDOSCOPY PROCEDURE REPORT  PATIENT:  [name], [name]  MR#:  [id number] BIRTHDATE:   1981/02/24   GENDER:   female  ENDOSCOPIST:   Hedwig Morton. Juanda Chance, MD Referred by: Beverley Fiedler, M.D.  PROCEDURE DATE:  07/25/2008 PROCEDURE:  EGD with biopsy ASA CLASS:   Class I INDICATIONS: nausea   MEDICATIONS:    Versed 10 mg, Fentanyl 100 mcg, Benadryl 50 mg TOPICAL ANESTHETIC:    DESCRIPTION OF PROCEDURE:   After the risks benefits and alternatives of the procedure were thoroughly explained, informed consent was obtained.  The LB [id number] [id number] endoscope was introduced through the mouth and advanced to the second portion of the duodenum, without limitations.  The instrument was slowly withdrawn as the mucosa was fully examined. <<PROCEDUREIMAGES>>        <<OLD IMAGES>>  The upper, middle, and distal third of the esophagus were carefully inspected and no abnormalities were noted. The z-line was well seen at the GEJ. The endoscope was pushed into the fundus which was normal including a retroflexed view. The antrum,gastric body, first and second part of the duodenum were unremarkable (see image1, image2, image3, image4, and image5).    Retroflexion was not performed.  The scope was then withdrawn from the patient and the procedure completed.  COMPLICATIONS:   None  ENDOSCOPIC IMPRESSION:  1) Normal EGD  normal esophagus, stomach, duodenum, s/p H.Pylori, fundic gland polyps secondary to PPI's, of no clinical significance RECOMMENDATIONS:  continue Phenergan and Reglan prn,  consider Gastric emptying scan if vomiting reccurs  REPEAT EXAM:   In 0 year(s) for.   _______________________________ Hedwig Morton. Juanda Chance, MD    CC: Beverley Fiedler, MD     REPORT OF SURGICAL PATHOLOGY   Case #: [id number] Patient Name: [name], [name]. Office Chart Number:  [id number]   MRN:  [id number] Pathologist: Beulah Gandy. Luisa Hart, MD DOB/Age  [birth year] (Age: 30)    Gender: F Date Taken:  07/25/2008 Date Received: 07/25/2008   FINAL DIAGNOSIS   ***MICROSCOPIC EXAMINATION AND DIAGNOSIS***   GASTRIC ANTRUM, BIOPSIES:  - BENIGN ANTRAL MUCOSA. NO HELICOBACTER PYLORI, DYSPLASIA OR MALIGNANCY IDENTIFIED.     COMMENT A Warthin-Starry stain is performed to determine the possibility of the presence of Helicobacter pylori. The Warthin-Starry stain is negative for organisms of Helicobacter pylori. The control(s) stained appropriately.   cc Date Reported:  07/26/2008     Beulah Gandy. Luisa Hart, MD *** Electronically Signed Out By JDP ***  July 27, 2008 MRN: [id number]    [facility] [address] [place], [place]  [address]    Dear [name],  I am pleased to inform you that the biopsies taken during your recent endoscopic examination did not show any evidence of cancer upon pathologic examination.The biopsies from Your stomach showed normal tissue. No evidence of H.Pylori.  Additional information/recommendations:  _x_No further action is needed at this time.  Please follow-up with      your primary care physician for your other healthcare needs.  _  _x_ Continue with the treatment plan as outlined on the day of your      exam.     Please call us if you are having persistent problems or have questions about your condition that have not been fully answered at this time.  Sincerely,  Hart Carwin MD  This letter has been electronically signed by your  physician.  This report was created from the original endoscopy report, which was reviewed and signed by the above listed endoscopist.

## 2010-08-13 NOTE — Letter (Signed)
Summary: Patient [facility] Biopsy Results  [facility]  [address] [place], [place] [address]   Phone: [phone]  Fax: [phone]        July 27, 2008 MRN: [id number]    [name] [address] [place], [place]  [address]    Dear [name],  I am pleased to inform you that the biopsies taken during your recent endoscopic examination did not show any evidence of cancer upon pathologic examination.The biopsies from Your stomach showed normal tissue. No evidence of H.Pylori.  Additional information/recommendations:  _x_No further action is needed at this time.  Please follow-up with      your primary care physician for your other healthcare needs.  _  _x_ Continue with the treatment plan as outlined on the day of your      exam.     Please call us if you are having persistent problems or have questions about your condition that have not been fully answered at this time.  Sincerely,  Hart Carwin MD  This letter has been electronically signed by your physician.

## 2010-08-13 NOTE — Progress Notes (Signed)
Summary: Office Visit-[facility]  Office Visit-[facility]   Imported By: Lowry Ram CMA 07/10/2008 16:56:11  _____________________________________________________________________  External Attachment:    Type:   Image     Comment:   External Document

## 2010-08-13 NOTE — Procedures (Signed)
Summary: Gastroenterology-Office Visit  Gastroenterology-Office Visit   Imported By: Lowry Ram CMA 07/10/2008 16:53:25  _____________________________________________________________________  External Attachment:    Type:   Image     Comment:   External Document

## 2010-08-13 NOTE — Letter (Signed)
Summary: HANDICAP CARD  HANDICAP CARD   Imported By: Arta Bruce 06/26/2007 09:39:51  _____________________________________________________________________  External Attachment:    Type:   Image     Comment:   External Document

## 2010-08-13 NOTE — Letter (Signed)
Summary: CARE MANAGEMENT DEPT  CARE MANAGEMENT DEPT   Imported ByArta Bruce 07/30/2009 15:03:26  _____________________________________________________________________  External Attachment:    Type:   Image     Comment:   External Document

## 2010-08-13 NOTE — Progress Notes (Signed)
----   Converted from flag ---- ---- 04/24/2007 8:36 AM, Lehman Prom FNP wrote: notify pt that culture of wound shows that she needs additional antibiotics.  Pt will need augmentin 875/125 1 tablet by mouth two times a day for 10 days. call meds into pharmacy of her choice and document med in EMR after called in. ------------------------------  Pt aware of results and augmentin called to [facility]  ..................................................................Marland KitchenElmarie Shiley McCoy CMA  April 25, 2007 11:14 AM

## 2010-08-13 NOTE — Assessment & Plan Note (Signed)
Summary: carbuncle on left buttock   Vital Signs:  Patient Profile:   30 Years Old Female LMP:     04/13/2007 Height:     61 inches Weight:      124 pounds BMI:     23.51 BSA:     1.54 Temp:     97.5 degrees F Pulse rate:   52 / minute Pulse rhythm:   regular Resp:     18 per minute BP sitting:   104 / 80  (left arm) Cuff size:   regular  Pt. in pain?   yes    Location:   buttock lt    Intensity:   6    Type:       sharp  Vitals Entered By: Gaylyn Cheers RN (April 20, 2007 8:49 AM)  Menstrual History: LMP (date): 04/13/2007 LMP - Character: normal Menarche: 10 Menses interval: 30 days Menstrual flow: 5 days              Is Patient Diabetic? No  Does patient need assistance? Ambulation Normal Comments given doxy 100mg  1 by mouth two times a day in ER     Chief Complaint:  seen @ [facility] 10/07 abcesss left buttocks.  History of Present Illness:  Pt seen in the ER on 04/18/07 on abscess on hip. she was started on antibiotics.  Pt admits that she took and needle in an attempt to "lance" the area at home.  She seems pretty aggressive about the fact that she did a procedure at home. Pt comes into the office today hoping that his provider will do a procedure. she is taking the medication at home.  Denies any fever but area is still very painful.     Current Allergies: ! ERYTHROMYCIN ! * ATARAX ! NEURONTIN (GABAPENTIN)    Risk Factors:  Tobacco use:  never Passive smoke exposure:  no Drug use:  no HIV high-risk behavior:  no Caffeine use:  2 drinks per day Alcohol use:  no Exercise:  no Seatbelt use:  0 % Sun Exposure:  rarely  Family History Risk Factors:    Family History of MI in females < 91 years old:  no    Family History of MI in males < 53 years old:  yes  PAP Smear History:    Date of Last PAP Smear:  08/12/2005   Review of Systems  General      Denies chills, fatigue, and fever.  CV      Denies bluish discoloration of lips  or nails, chest pain or discomfort, difficulty breathing at night, difficulty breathing while lying down, fainting, fatigue, leg cramps with exertion, lightheadness, near fainting, palpitations, shortness of breath with exertion, swelling of feet, swelling of hands, and weight gain.  Resp      Denies chest discomfort, chest pain with inspiration, cough, coughing up blood, excessive snoring, hypersomnolence, morning headaches, pleuritic, shortness of breath, sputum productive, and wheezing.  GI      Denies abdominal pain, bloody stools, change in bowel habits, constipation, dark tarry stools, diarrhea, excessive appetite, gas, hemorrhoids, indigestion, loss of appetite, nausea, vomiting, vomiting blood, and yellowish skin color.  Derm      left buttock abscess   Physical Exam  General:     alert.   Head:     normocephalic.   Nose:     no external deformity.   Neck:     supple.   Lungs:     normal respiratory  effort, no crackles, and no wheezes.   Heart:     normal rate, regular rhythm, and no murmur.   Abdomen:     soft and non-tender.   Skin:     left buttock with erythematous, fluctuant 3cm x 2cm area tender with palpation    Impression & Recommendations:  Problem # 1:  CARBUNCLE, BUTTOCK (ICD-680.5) area cleaned and prepped under sterile technique.  xylocaine 1cc used to localize the area.  1 cm incision made with scapel, area drained.  culture obtained.  purulent drainage.  covered with gauze and tape Orders: T-Culture, Wound ([id number]) I&D Abscess, Simple / Single (10060)  supplies used - sterile tray with sterile gloves, scapel, needle and syringe, culture collection kit. pt advised to change dressings daily.  continue antibiotics for full course.  Complete Medication List: 1)  Risperdal 1 Mg Tabs (Risperidone) .... Taking 1 and 1/2 tablets at bedtime recently increased 2)  Metoclopramide Hcl 10 Mg Tabs (Metoclopramide hcl) .... Take 1 tablet every 4 hrs as  needed 3)  Clonazepam 1 Mg Tabs (Clonazepam) .... Take 1 tablet by mouth three times a day 4)  Morphine Sulfate 15 Mg Tabs (Morphine sulfate) .... Take 2 tablet by mouth three times a day(per dr.evans,urology) 5)  Hydroxyzine Hcl 25 Mg Tabs (Hydroxyzine hcl) .... Take 1 tablet by mouth once a day 6)  Lamictal 150 Mg Tabs (Lamotrigine) 7)  Ibuprofen 800 Mg Tabs (Ibuprofen) .... Take 1 tablet by mouth three times a day 8)  Triamcinolone Acetonide 0.025 % Crea (Triamcinolone acetonide) .... As needed 9)  Metronidazole 500 Mg Tabs (Metronidazole) .Marland Kitchen.. 1 by mouth two times a day x 5days 10)  Fluconazole 150 Mg Tabs (Fluconazole) .Marland Kitchen.. 1 by mouth qday x3days 11)  Betasept Surgical Scrub 4 % Liqd (Chlorhexidine gluconate) .Marland Kitchen.. 1 application topically daily to affected area. let sit for 5 minutes then rinse   Patient Instructions: 1)  Continue to take antibiotics as ordered 2)  The culture of your wound will be sent to the lab 3)  Clean area daily with betasept.  let sit for 5 minutes.  rinse with water then dry.  cover with guaze and tape.    Prescriptions: BETASEPT SURGICAL SCRUB 4 %  LIQD (CHLORHEXIDINE GLUCONATE) 1 application topically daily to affected area. let sit for 5 minutes then rinse  #14ml x 0   Entered and Authorized by:   Lehman Prom FNP   Signed by:   Lehman Prom FNP on 04/20/2007   Method used:   Print then Give to Patient   RxID:   [id number]  ]

## 2010-08-13 NOTE — Progress Notes (Signed)
Summary: Office Visit-[facility]  Office Visit-[facility]   Imported By: Lowry Ram CMA 07/10/2008 16:48:28  _____________________________________________________________________  External Attachment:    Type:   Image     Comment:   External Document

## 2010-08-13 NOTE — Assessment & Plan Note (Signed)
Vital Signs:  Patient Profile:   30 Years Old Female Weight:      123 pounds Temp:     97.6 degrees F oral Pulse rate:   72 / minute Pulse rhythm:   regular Resp:     20 per minute BP sitting:   94 / 60  Pt. in pain?   yes    Location:   vaginal    Intensity:   8  Vitals Entered By: Mikey College (March 08, 2007 9:58 AM)                Chief Complaint:  vaginal itching/vaginal irritation.  History of Present Illness: seen [facility] urgent care on 02/21/2007 and given triamcinolone lotion for vaginal itching;unknown diagnosis. Still with vaginal itching.Not sexually active.No menses since 6/08.   Current Allergies: ! ERYTHROMYCIN ! * ATARAX ! NEURONTIN (GABAPENTIN)     Review of Systems       No urinary frequency. Has chronic bladder spasms,abd.cramps from IC.   Physical Exam  General:     alert, well-developed, well-nourished, and well-hydrated.   Head:     normocephalic and atraumatic.   Genitalia:     no external lesions, no vaginal discharge, and mucosa pink and moist.Prev.had shaved pubic hair but now grown back in. No sign of folliculitis.No vaginal irritation. Wet prep with multiple clue cells.No trich.No yeast or hyphae seen.   no external lesions, no vaginal discharge, and mucosa pink and moist.      Impression & Recommendations:  Problem # 1:  BACTERIAL VAGINITIS (ICD-616.10) D/w pt. Not STD. Treat with Flagyl.Avoid  ETOH while on med. Her updated medication list for this problem includes:    Metronidazole 500 Mg Tabs (Metronidazole) .Marland Kitchen... 1 by mouth two times a day x 5days   Problem # 2:  PRURITUS, GENITALIA (ICD-698.1) Prev. used triamcinolone lotion from urgent care. At risk for yeast infection although no yeast noted on wet prep at today's visit. Will treat presumptively as pt to have bladder procedure next week by Dr.Evans.  Complete Medication List: 1)  Risperdal 0.25 Mg Tabs (Risperidone) .... Take 2 tab by mouth at bedtime 2)  Metoclopramide  Hcl 10 Mg Tabs (Metoclopramide hcl) .... Take 1 tablet every 4 hrs as needed 3)  Clonazepam 1 Mg Tabs (Clonazepam) .... Take 1 tablet by mouth three times a day 4)  Morphine Sulfate 15 Mg Tabs (Morphine sulfate) .... Take 2 tablet by mouth three times a day(per dr.evans,urology) 5)  Hydroxyzine Hcl 25 Mg Tabs (Hydroxyzine hcl) .... Take 1 tablet by mouth once a day 6)  Lamictal 100 Mg Tabs (Lamotrigine) .... Take 1 tab by mouth at bedtime 7)  Ibuprofen 800 Mg Tabs (Ibuprofen) .... Take 1 tablet by mouth three times a day 8)  Triamcinolone Acetonide 0.025 % Crea (Triamcinolone acetonide) .... As needed 9)  Metronidazole 500 Mg Tabs (Metronidazole) .Marland Kitchen.. 1 by mouth two times a day x 5days 10)  Fluconazole 150 Mg Tabs (Fluconazole) .Marland Kitchen.. 1 by mouth qday x3days   Patient Instructions: 1)  Pt desires to establish with GYN in future. Her severe IC makes gyn exams and f/u very difficult for her. Pt may call in next several months for her referral(when recovered from planned bladder procedure). 2)  Please schedule a follow-up appointment as needed.    Prescriptions: FLUCONAZOLE 150 MG  TABS (FLUCONAZOLE) 1 by mouth qday x3days  #3 x 1   Entered and Authorized by:   Beverley Fiedler MD   Signed  by:   Beverley Fiedler MD on 03/08/2007   Method used:   Handwritten   RxID:   [id number] METRONIDAZOLE 500 MG  TABS (METRONIDAZOLE) 1 by mouth two times a day x 5days  #10 x 0   Entered and Authorized by:   Beverley Fiedler MD   Signed by:   Beverley Fiedler MD on 03/08/2007   Method used:   Handwritten   RxID:   [id number]

## 2010-08-13 NOTE — Progress Notes (Signed)
Summary: office visit  Phone Note Call from Patient Call back at Home Phone [phone]   Caller: Mom Summary of Call: The pt has a  stye in her eye and is requesting to be seen asap. Dr Barbaraann Barthel Initial call taken by: Manon Hilding,  April 06, 2007 2:35 PM  Follow-up for Phone Call        OK to schedule appointment for patient at next available time slot Follow-up by: Vesta Mixer CMA,  April 06, 2007 3:44 PM  Additional Follow-up for Phone Call Additional follow up Details #1::        The pt will come on Wednesday, Oct 1,2008 at 11:15 am. with Dr Barbaraann Barthel Additional Follow-up by: Manon Hilding,  April 06, 2007 4:50 PM

## 2010-08-13 NOTE — Progress Notes (Signed)
Summary: office visit  Phone Note Call from Patient Call back at Home Phone [phone]   Caller: Patient Summary of Call: This patient doesn't has transportation and would like to be seen on Thursday because her mother can bring her up on that day.  She has abcess in her butt. Dr Barbaraann Barthel patient Initial call taken by: Manon Hilding,  April 18, 2007 2:16 PM  Follow-up for Phone Call        pt to come in am @8 :30am 04/21/07 Follow-up by: Mikey College CMA,  April 19, 2007 10:01 AM

## 2010-08-13 NOTE — Letter (Signed)
Summary: MAILED RECORDS TO [facility]  MAILED RECORDS TO [facility]   Imported By: Arta Bruce 10/14/2008 11:53:08  _____________________________________________________________________  External Attachment:    Type:   Image     Comment:   External Document

## 2010-08-13 NOTE — Progress Notes (Signed)
Summary: NEEDS DIFLUCAN CALLED IN  Phone Note Call from Patient   Summary of Call: [name]. HAS A YEAST INFECTION FROM THE ANTIBIOTIC AND NEEDS DIFLUCAN CALLED INTO [facility]. Initial call taken by: Leodis Rains,  April 26, 2007 2:58 PM  Follow-up for Phone Call        [facility] [address] Follow-up by: Lehman Prom FNP,  April 26, 2007 5:16 PM  Additional Follow-up for Phone Call Additional follow up Details #1::        pt notified to pick up rx Additional Follow-up by: Mikey College CMA,  April 27, 2007 3:17 PM      Prescriptions: FLUCONAZOLE 150 MG  TABS (FLUCONAZOLE) 1 by mouth qday x3days  #3 x 0   Entered and Authorized by:   Lehman Prom FNP   Signed by:   Lehman Prom FNP on 04/26/2007   Method used:   [facility] to ...       [id number] [address]       [address]       [place], [place]  [address]       Ph: [phone] or [phone]       Fax: [phone]   RxID:   [id number]

## 2010-08-13 NOTE — Progress Notes (Signed)
Summary: HANDICAPP APPLICATION  Phone Note Call from Patient   Summary of Call: [name] PT. [name] DROPPED OFF A HANDICAP APPLICATION TO BE FILLED OUT. Initial call taken by: Leodis Rains,  June 22, 2007 3:21 PM  Follow-up for Phone Call        Done. Notify pt to pick-up or you can mail it.. Follow-up by: Beverley Fiedler MD,  June 23, 2007 1:23 PM  Additional Follow-up for Phone Call Additional follow up Details #1::        PATIENT AWARE TO PICK UP FORM TODAY Additional Follow-up by: Leodis Rains,  June 26, 2007 9:47 AM

## 2010-08-13 NOTE — Progress Notes (Signed)
Summary: Vomiting  Phone Note Call from Patient Call back at [facility] Phone [phone]   Caller: Patient Summary of Call: The pt is vomiting every night and she is loosing her bipolar medication because she can see the medication in the trash can.  The pt cannot drive and would like to know if she can come any thursday for an office visit. Dr Barbaraann Barthel Initial call taken by: Manon Hilding,  September 27, 2008 10:55 AM  Follow-up for Phone Call        spoke with pt and she stated that she has been vomiting for about three/ four days now.....Marland Kitchen pt has meds (metoclopramide 10mg  from urologist and promethazine 25mg  from Urgent care) to stop vomiting but they are not working...Marland Kitchen pt informed me she is going to Urgent Care at 4pm today to have them to look at her situation b/c she is unable to handle this Follow-up by: Armenia Shannon,  September 27, 2008 12:52 PM  Additional Follow-up for Phone Call Additional follow up Details #1::        per above note pt is going to urgent care will leave for Dr. Barbaraann Barthel to review - just FYI Additional Follow-up by: Lehman Prom FNP,  September 27, 2008 5:34 PM

## 2010-08-13 NOTE — Progress Notes (Signed)
Summary: The [name] wants to be seen tomorrow if is possible.  Phone Note Call from Patient Call back at [facility] Phone [phone]   Caller: Other Relative Complaint: Urinary/GYN Problems Summary of Call: the grandmother called on behalf [name] [name].  The [name] is going to have a surgery on Sept 2, 2008. She wants to see Dr. Luciana Axe priorly her surgery. The [name] has a severe vaginal itching. [phone] Initial call taken by: Manon Hilding,  March 07, 2007 12:00 PM  Follow-up for Phone Call        [name] scheduled for appt Follow-up by: Leodis Rains,  March 08, 2007 7:57 AM

## 2010-08-13 NOTE — Progress Notes (Signed)
Summary: THE PT WILL BE HER ON WED 03/08/07 / FEMALE PROBLEM  Phone Note Call from Patient Call back at Home Phone [phone]   Caller: Patient Reason for Call: Acute Illness Summary of Call: FEMALE INFECTION/  Initial call taken by: Manon Hilding,  March 06, 2007 3:01 PM  Follow-up for Phone Call        Pt stated she already made an appt. but thanks for calling back. Follow-up by: Mikey College,  March 06, 2007 5:09 PM

## 2010-08-13 NOTE — Progress Notes (Signed)
Summary: Referral Info   Phone Note Call from Patient   Caller: Patient Reason for Call: Referral Summary of Call: PT SAID THAT DR Barbaraann Barthel NEED THE REFERRAL INFORMATION  [facility] PH [phone]  ADDRESS [address] DR Doylene Canard . HER APPT 8-20- AT 3:30 PM . PLEASE, SEND THE REFERRAL  .  Fuller Song [place] [phone]   [facility] YOU  Initial call taken by: Cheryll Dessert,  February 05, 2008 9:22 AM  Follow-up for Phone Call        referral done in EMR. CMA to schedule for patient Follow-up by: Beverley Fiedler MD,  February 05, 2008 6:10 PM  Additional Follow-up for Phone Call Additional follow up Details #1::        done!!!!!!! Additional Follow-up by: Mikey College CMA,  February 06, 2008 4:28 PM  New Problems: ACNE 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   Additional Follow-up for Phone Call Additional follow up Details #2::    left message to return call................Marland KitchenMikey College CMA  February 07, 2008 12:16 PM   Additional Follow-up for Phone Call Additional follow up Details #3:: Details for Additional Follow-up Action Taken: PT IS RETURNING YOUR CALL   pt aware of referral......................Marland KitchenMikey College CMA  February 08, 2008 11:10 AM  Additional Follow-up by: Cheryll Dessert,  February 07, 2008 2:53 PM  New Problems: ACNE 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

## 2010-08-13 NOTE — Miscellaneous (Signed)
Summary: Abdominal Pain (Physician Report)                          PHYSICIAN DOCUMENTATION SHEET          Fri Nov 20 10:34:15 EST 2009          [facility]        [address]       [place], [place] [address]        PHONE: [phone]      MRN:  [id number]        Account #: [id number]   Name: Lydia, Delbridge Anderson       Sex: F   Age: 30         DOB: 13-Oct-1980   Complaint: Abdominal pain      Primary   Diagnosis:   Abdominal            pain   Arrival Time: 05/27/2008 16:57      Discharge Time: 05/27/2008 22:24   All Providers: Dr Elise Benne - MD PSA   --------------------------------------------------------------------------------------         PROVIDER: Dr Elise Benne - MD PSA        HPI:       The patient is a 30 year old female who presents with a chief complaint of abdom-       inal  pain. The history was provided by the patient. Pt comes to ED for abd pain.       She has long hx of interstitial cystitis.  Sx today are c/w her typical IC flare.       No new  or atypical sx.  No fevers.   No vomiting.  She has low pelvic pain that       radiates up into her abd.  No dysuria.  She has not taken any meds at  home  for       her  pain  b/c  they  make her nauseated. The abdominal pain started today. The       onset was gradual. The abdominal pain is located in the bilateral lower  abdomen.       The   abdominal pain has no radiation. It is characterized as burning. The condi-       tion is aggravated by nothing. The condition is relieved by nothing.     21:29 05/27/2008 by Elise Benne - MD PSA, Dr           ROS:       Statement: all systems negative except as marked or noted in the HPI       Constitutional: Negative for fever and chills.       Eyes: Negative for visual loss.       Cardiovascular: Negative for chest pain.       Respiratory: Negative for cough and dyspnea.       Gastrointestinal: Positive for nausea and abdominal pain.  Negative for vomiting,        diarrhea and blood in stool.       Genitourinary: Negative for dysuria and hematuria.       Skin: Negative for pruritus and rash.       Neuro: Negative for headache and altered mental status.       Allergic: Negative for rash.     21:29 05/27/2008 by Elise Benne - MD PSA, Dr  PMH:       Documentation: physician reviewed/amended       Historian: patient       Last normal period: 03/26/2008       Past  medical  history: allergies, bipolar disorder, interstitial cystitis NOTE -       states is going to have surgery later this month for the IC as she is having  too       many flareups.          Family History: CAD, cancer, hypertension, stroke       Surgical  History: colon resection NOTE - Foot Surgery Bladder Surgery x10-20 for       IC Cystoscopy Botox with IC          Social History: non-smoker, non-drinker, no drug abuse, lives alone       TB Screen: no symptoms present       Travel History: no recent air travel, no recent domestic travel, no  recent  for-       eign travel       Contraception: abstinence       Immunization status: tetanus < 5 years       Special Needs: no barriers to learning       Gynecologic history: no significant obstetrical complication         Physical examination:       Vital signs and O2 SAT: reviewed       Constitutional: well developed, well nourished, in no acute distress       Head and Face: normocephalic       Eyes: normal appearance, EOMI       ENMT: mouth and pharynx normal       Neck: supple, no thyromegaly, no lymphadenopathy       Cardiovascular: regular rate and rhythm       Respiratory: normal, breath sounds clear  T  equal bilaterally       Chest: nontender       Abdomen:  soft,  diffuse tenderness, tenderness is mild, nondistended, no masses,       no hepatosplenomegaly, no guarding, no rebound tenderness       Extremities: normal       Neuro: AA T Ox3       Skin: color normal, no rash       Psychiatric: no  abnormalities of mood or affect       Lymph: no adenopathy of neck     21:29 05/27/2008 by Elise Benne - MD PSA, Dr           Reviewed result:       Result Type: Cleda Daub: [id number]       Step Type: LAB       Procedure Name: PREGNANCY, URINE POC        Procedure: PREGNANCY, URINE POC        Procedure Notes: PREGNANCY, URINE - THE SENSITIVITY OF THIS METHODOLOGY  IS  >24       mIU/mL        Result:        PREGNANCY, URINE   NEGATIVE           21:16 05/27/2008 by Elise Benne - MD PSA, Dr        Reviewed result:       Result Type: Cleda Daub: [id number]       Step Type: LAB  Procedure Name: URINE MACROSCOPIC        Procedure: URINE MACROSCOPIC        Procedure Notes: LEUKOCYTE ESTERASE - MICROSCOPIC NOT DONE ON URINES WITH NEGA-       TIVE PROTEIN, BLOOD, LEUKOCYTES, NITRITE, OR GLUCOSE <1000 mg/dL.        Result:        URINE COLOR   YELLOW   [YELLOW]        URINE APPEARANCE   CLEAR    [CLEAR]        URINE SPEC GRAVITY   1.010    [1.005-1.030]        URINE PH    7.0    [5.0-8.0]        URINE GLUCOSE   NEGATIVE    mg/dL  [NEG]        URINE HEMOGLOBIN   NEGATIVE   [NEG]        URINE BILIRUBIN   NEGATIVE   [NEG]        URINE KETONES   NEGATIVE    mg/dL  [NEG]        URINE TOTAL PROTEIN  NEGATIVE    mg/dL  [NEG]        URINE UROBILINOGEN   0.2       mg/dL  [4.0-9.8]        URINE NITRITE   NEGATIVE   [NEG]        LEUKOCYTE ESTERASE   NEGATIVE   [NEG]           21:16 05/27/2008 by Elise Benne - MD PSA, Dr        Reviewed result:       Result Type: Cleda Daub: [id number]       Step Type: LAB       Procedure Name: I STAT CHM 8 PANEL        Procedure: I STAT CHM 8 PANEL        Result:        SODIUM    138       mEq/Anderson  [135-145]        POTASSIUM    5.7       mEq/Anderson  [3.5-5.1]      H        CHLORIDE    107       mEq/Anderson  [96-112]        BUN    3       mg/dL  [9-56]       Anderson        CREATININE    0.9       mg/dL  [2.1-3.0]         GLUCOSE    89       mg/dL  [86-57]        CALCIUM, IONIZED   1.07       mmol/Anderson  [1.12-1.32]      Anderson        TCO2    26       mmol/Anderson  [0-100]        HEMOGLOBIN    13.6       g/dL  [84.6-96.2]        HEMATOCRIT    40.0       %   [36.0-46.0]           21:16 05/27/2008 by Elise Benne - MD PSA, Dr  Reviewed result:       Result Type: Cleda Daub: [id number]       Step Type: LAB       Procedure Name: CBC WITH DIFF        Procedure: CBC WITH DIFF        Result:        WBC COUNT    5.4       K/uL  [4.0-10.5]        RBC COUNT    4.51       MIL/uL  [3.87-5.11]        HEMOGLOBIN    13.6       g/dL  [54.0-98.1]        HEMATOCRIT    40.5       %   [36.0-46.0]        MCV    89.7       fL  [78.0-100.0]        MCHC    33.5       g/dL  [19.1-47.8]        RDW    12.1       %   [11.5-15.5]        PLATELET COUNT   257       K/uL  [150-400]        NEUTROPHIL    55       %   [43-77]        ABS GRANULOCYTE   2.9       K/uL  [1.7-7.7]        LYMPHOCYTE    32       %   [12-46]        ABS LYMPH    1.7       K/uL  [0.7-4.0]        MONOCYTE    9       %   [3-12]        ABS MONOCYTE   0.5       K/uL  [0.1-1.0]        EOSINOPHIL    3       %   [0-5]        ABS EOS    0.2       K/uL  [0.0-0.7]        BASOPHIL    1       %   [0-1]        ABS BASO    0.1       K/uL  [0.0-0.1]           21:16 05/27/2008 by Elise Benne - MD PSA, Dr        Reviewed result:       Result Type: Cleda Daub: [id number]       Step Type: LAB       Procedure Name: POTASSIUM        Procedure: POTASSIUM        Procedure Notes: POTASSIUM - DELTA CHECK NOTED        Result:        POTASSIUM    3.7       mEq/Anderson  [3.5-5.1]           21:31 05/27/2008 by Elise Benne - MD PSA, Dr     Attending:       Supervision of:  Midlevel: no midlevel was involved in the care of this patient.     21:50 05/27/2008 by Elise Benne - MD PSA, Dr           Discharge:       Discharge  Instructions:         abdominal pain - no follow-up     Patient disposition:       Patient disposition: Disch - Home       Primary Diagnosis: abdominal pain       Additional diagnoses: interstitial cystitis            Counseling:  advised  of diagnosis, advised of treatment plan, advised of xray         and lab findings, advised of need for  close  follow-up, advised  of  need  to         return for worsening or changing symptoms, patient voices understanding     21:50 05/27/2008 by Elise Benne - MD PSA, Dr           ED Course:       Comments:  Pt comes to ed for lower abd pain.  Sx are c/w IC that she has had for       many years.  No new or atypical sx.  Pt has no fevers and no suggesion  of peri-       tonitis on exam.  No elevation in WBC.   After a couple of rounds of morphine her       pain is improved and she is ready to go home.   Pt will follow up with her urolo-       gist.     21:50 05/27/2008 by Elise Benne - MD PSA, Dr           Libby Maw orders:       Verify orders: verify all orders     15:12 05/28/2008 by Elise Benne - MD PSA, Dr           Chart electronically signed by Responsible Physician     15:12 05/28/2008 by Elise Benne - MD PSA, Dr            REVIEWER: Philmore Pali        Review completed: Documentation completed     10:34 05/31/2008 by Philmore Pali

## 2010-08-13 NOTE — Assessment & Plan Note (Signed)
Summary: uti///kt  Nurse Visit   Vitals Entered By: Mikey College CMA (April 01, 2008 3:13 PM)             Comments pt arrived still having pains from UTI. pt was recently seen @ [facility] urgent care. Per Dr Barbaraann Barthel we will get another urine culture and pt is still taking antibiotics she has 3 more days left. DId advise pt  that we will contact her when results are in and after meds are completed and she is still having pain please call to schedule an appt at that time.     Prior Medications: RISPERDAL 1 MG  TABS (RISPERIDONE) taking 1 and 1/2 tablets at bedtime recently increased METOCLOPRAMIDE HCL 10 MG  TABS (METOCLOPRAMIDE HCL) Take 1 tablet every 4 hrs as needed CLONAZEPAM 1 MG  TABS (CLONAZEPAM) Take 1 tablet by mouth three times a day MORPHINE SULFATE 15 MG  TABS (MORPHINE SULFATE) Take 2 tablet by mouth three times a day(per Dr.Evans,urology) HYDROXYZINE HCL 25 MG  TABS (HYDROXYZINE HCL) Take 1 tablet by mouth once a day LAMICTAL 150 MG  TABS (LAMOTRIGINE)  IBUPROFEN 800 MG  TABS (IBUPROFEN) Take 1 tablet by mouth three times a day TRIAMCINOLONE ACETONIDE 0.025 %  CREA (TRIAMCINOLONE ACETONIDE) as needed METRONIDAZOLE 500 MG  TABS (METRONIDAZOLE) 1 by mouth two times a day x 5days FLUCONAZOLE 150 MG  TABS (FLUCONAZOLE) 1 by mouth qday x3days BETASEPT SURGICAL SCRUB 4 %  LIQD (CHLORHEXIDINE GLUCONATE) 1 application topically daily to affected area. let sit for 5 minutes then rinse Current Allergies: ! ERYTHROMYCIN ! * ATARAX ! NEURONTIN (GABAPENTIN) Laboratory Results   Urine Tests  Date/Time Received: April 01, 2008 3:13 PM Date/Time Reported: April 01, 2008 3:13 PM  Routine Urinalysis   Color: yellow Appearance: Clear Glucose: negative   (Normal Range: Negative) Bilirubin: negative   (Normal Range: Negative) Ketone: negative   (Normal Range: Negative) Spec. Gravity: >=1.030   (Normal Range: 1.003-1.035) Blood: negative   (Normal Range: Negative) pH: 5.0    (Normal Range: 5.0-8.0) Protein: negative   (Normal Range: Negative) Urobilinogen: 0.2   (Normal Range: 0-1) Nitrite: negative   (Normal Range: Negative) Leukocyte Esterace: negative   (Normal Range: Negative)    Comments: urine culture sent to lab.......      Orders Added: 1)  Est. Patient Nurse visit [09003] 2)  T-Culture, Urine [[id number]]    ]
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
## 2010-09-21 LAB — URINALYSIS, ROUTINE W REFLEX MICROSCOPIC
Bilirubin Urine: NEGATIVE
Bilirubin Urine: NEGATIVE
Glucose, UA: NEGATIVE mg/dL
Glucose, UA: NEGATIVE mg/dL
Hgb urine dipstick: NEGATIVE
Ketones, ur: NEGATIVE mg/dL
Ketones, ur: NEGATIVE mg/dL
Leukocytes, UA: NEGATIVE
Nitrite: NEGATIVE
Nitrite: NEGATIVE
Protein, ur: NEGATIVE mg/dL
Protein, ur: NEGATIVE mg/dL
Specific Gravity, Urine: 1.005 (ref 1.005–1.030)
Specific Gravity, Urine: 1.037 — ABNORMAL HIGH (ref 1.005–1.030)
Urobilinogen, UA: 0.2 mg/dL (ref 0.0–1.0)
Urobilinogen, UA: 0.2 mg/dL (ref 0.0–1.0)
pH: 5.5 (ref 5.0–8.0)
pH: 5.5 (ref 5.0–8.0)

## 2010-09-21 LAB — URINE CULTURE
Colony Count: 35000
Culture  Setup Time: 201112312019

## 2010-09-21 LAB — URINE MICROSCOPIC-ADD ON

## 2010-09-21 LAB — POCT PREGNANCY, URINE
Preg Test, Ur: NEGATIVE
Preg Test, Ur: NEGATIVE

## 2010-09-23 LAB — URINALYSIS, ROUTINE W REFLEX MICROSCOPIC
Bilirubin Urine: NEGATIVE
Bilirubin Urine: NEGATIVE
Glucose, UA: NEGATIVE mg/dL
Glucose, UA: NEGATIVE mg/dL
Hgb urine dipstick: NEGATIVE
Hgb urine dipstick: NEGATIVE
Ketones, ur: NEGATIVE mg/dL
Ketones, ur: NEGATIVE mg/dL
Leukocytes, UA: NEGATIVE
Nitrite: NEGATIVE
Nitrite: NEGATIVE
Protein, ur: NEGATIVE mg/dL
Protein, ur: NEGATIVE mg/dL
Specific Gravity, Urine: 1.01 (ref 1.005–1.030)
Specific Gravity, Urine: 1.024 (ref 1.005–1.030)
Urobilinogen, UA: 0.2 mg/dL (ref 0.0–1.0)
Urobilinogen, UA: 0.2 mg/dL (ref 0.0–1.0)
pH: 7 (ref 5.0–8.0)
pH: 7.5 (ref 5.0–8.0)

## 2010-09-23 LAB — URINE MICROSCOPIC-ADD ON

## 2010-09-23 LAB — POCT PREGNANCY, URINE: Preg Test, Ur: NEGATIVE

## 2010-09-24 LAB — URINALYSIS, ROUTINE W REFLEX MICROSCOPIC
Bilirubin Urine: NEGATIVE
Bilirubin Urine: NEGATIVE
Bilirubin Urine: NEGATIVE
Bilirubin Urine: NEGATIVE
Glucose, UA: NEGATIVE mg/dL
Glucose, UA: NEGATIVE mg/dL
Glucose, UA: NEGATIVE mg/dL
Glucose, UA: NEGATIVE mg/dL
Hgb urine dipstick: NEGATIVE
Hgb urine dipstick: NEGATIVE
Hgb urine dipstick: NEGATIVE
Hgb urine dipstick: NEGATIVE
Ketones, ur: NEGATIVE mg/dL
Ketones, ur: NEGATIVE mg/dL
Ketones, ur: NEGATIVE mg/dL
Leukocytes, UA: NEGATIVE
Nitrite: NEGATIVE
Nitrite: NEGATIVE
Nitrite: NEGATIVE
Nitrite: NEGATIVE
Protein, ur: 30 mg/dL — AB
Protein, ur: NEGATIVE mg/dL
Protein, ur: NEGATIVE mg/dL
Protein, ur: NEGATIVE mg/dL
Specific Gravity, Urine: 1.007 (ref 1.005–1.030)
Specific Gravity, Urine: 1.012 (ref 1.005–1.030)
Specific Gravity, Urine: 1.043 — ABNORMAL HIGH (ref 1.005–1.030)
Specific Gravity, Urine: 1.028 (ref 1.005–1.030)
Urobilinogen, UA: 0.2 mg/dL (ref 0.0–1.0)
Urobilinogen, UA: 0.2 mg/dL (ref 0.0–1.0)
Urobilinogen, UA: 0.2 mg/dL (ref 0.0–1.0)
Urobilinogen, UA: 0.2 mg/dL (ref 0.0–1.0)
pH: 5.5 (ref 5.0–8.0)
pH: 6 (ref 5.0–8.0)
pH: 6.5 (ref 5.0–8.0)
pH: 5.5 (ref 5.0–8.0)

## 2010-09-24 LAB — CBC
HCT: 36 % (ref 36.0–46.0)
Hemoglobin: 12.2 g/dL (ref 12.0–15.0)
MCH: 31.6 pg (ref 26.0–34.0)
MCHC: 33.9 g/dL (ref 30.0–36.0)
MCV: 93.4 fL (ref 78.0–100.0)
Platelets: 197 10*3/uL (ref 150–400)
RBC: 3.85 MIL/uL — ABNORMAL LOW (ref 3.87–5.11)
RDW: 13.6 % (ref 11.5–15.5)
WBC: 5.5 10*3/uL (ref 4.0–10.5)

## 2010-09-24 LAB — BASIC METABOLIC PANEL
BUN: 9 mg/dL (ref 6–23)
CO2: 22 mEq/L (ref 19–32)
Calcium: 9.1 mg/dL (ref 8.4–10.5)
Chloride: 109 mEq/L (ref 96–112)
Creatinine, Ser: 0.82 mg/dL (ref 0.4–1.2)
GFR calc Af Amer: >60 mL/min (ref >60)
GFR calc non Af Amer: >60 mL/min (ref >60)
Glucose, Bld: 95 mg/dL (ref 70–99)
Potassium: 3.3 mEq/L — ABNORMAL LOW (ref 3.5–5.1)
Sodium: 138 mEq/L (ref 135–145)

## 2010-09-24 LAB — DIFFERENTIAL
Basophils Absolute: 0 10*3/uL (ref 0.0–0.1)
Basophils Relative: 0 % (ref 0–1)
Eosinophils Absolute: 0.1 10*3/uL (ref 0.0–0.7)
Eosinophils Relative: 2 % (ref 0–5)
Lymphocytes Relative: 38 % (ref 12–46)
Lymphs Abs: 2.1 10*3/uL (ref 0.7–4.0)
Monocytes Absolute: 0.3 10*3/uL (ref 0.1–1.0)
Monocytes Relative: 6 % (ref 3–12)
Neutro Abs: 3 10*3/uL (ref 1.7–7.7)
Neutrophils Relative %: 54 % (ref 43–77)

## 2010-09-24 LAB — POCT PREGNANCY, URINE
Preg Test, Ur: NEGATIVE
Preg Test, Ur: NEGATIVE
Preg Test, Ur: NEGATIVE

## 2010-09-24 LAB — URINE MICROSCOPIC-ADD ON

## 2010-09-25 LAB — URINALYSIS, ROUTINE W REFLEX MICROSCOPIC
Bilirubin Urine: NEGATIVE
Glucose, UA: NEGATIVE mg/dL
Hgb urine dipstick: NEGATIVE
Ketones, ur: NEGATIVE mg/dL
Nitrite: NEGATIVE
Protein, ur: NEGATIVE mg/dL
Specific Gravity, Urine: 1.039 — ABNORMAL HIGH (ref 1.005–1.030)
Urobilinogen, UA: 0.2 mg/dL (ref 0.0–1.0)
pH: 6 (ref 5.0–8.0)

## 2010-09-25 LAB — POCT PREGNANCY, URINE: Preg Test, Ur: NEGATIVE

## 2010-09-26 LAB — URINALYSIS, ROUTINE W REFLEX MICROSCOPIC
Bilirubin Urine: NEGATIVE
Glucose, UA: NEGATIVE mg/dL
Hgb urine dipstick: NEGATIVE
Ketones, ur: NEGATIVE mg/dL
Nitrite: NEGATIVE
Protein, ur: NEGATIVE mg/dL
Specific Gravity, Urine: 1.026 (ref 1.005–1.030)
Urobilinogen, UA: 0.2 mg/dL (ref 0.0–1.0)
pH: 7 (ref 5.0–8.0)

## 2010-09-26 LAB — POCT PREGNANCY, URINE: Preg Test, Ur: NEGATIVE

## 2010-09-27 ENCOUNTER — Emergency Department (HOSPITAL_COMMUNITY)
Admission: EM | Admit: 2010-09-27 | Discharge: 2010-09-27 | Disposition: A | Discharge status: Home / Self Care | Payer: Medicare Other | Attending: Emergency Medicine | Admitting: Emergency Medicine

## 2010-09-27 DIAGNOSIS — N3010 Interstitial cystitis (chronic) without hematuria: Secondary | ICD-10-CM | POA: Insufficient documentation

## 2010-09-27 DIAGNOSIS — G8929 Other chronic pain: Secondary | ICD-10-CM | POA: Insufficient documentation

## 2010-09-27 DIAGNOSIS — M549 Dorsalgia, unspecified: Secondary | ICD-10-CM | POA: Insufficient documentation

## 2010-09-27 DIAGNOSIS — F319 Bipolar disorder, unspecified: Secondary | ICD-10-CM | POA: Insufficient documentation

## 2010-09-27 LAB — URINE MICROSCOPIC-ADD ON

## 2010-09-27 LAB — URINALYSIS, ROUTINE W REFLEX MICROSCOPIC
Bilirubin Urine: NEGATIVE
Bilirubin Urine: NEGATIVE
Glucose, UA: NEGATIVE mg/dL
Glucose, UA: NEGATIVE mg/dL
Hgb urine dipstick: NEGATIVE
Hgb urine dipstick: NEGATIVE
Ketones, ur: NEGATIVE mg/dL
Ketones, ur: NEGATIVE mg/dL
Leukocytes, UA: NEGATIVE
Nitrite: NEGATIVE
Nitrite: NEGATIVE
Protein, ur: NEGATIVE mg/dL
Protein, ur: NEGATIVE mg/dL
Specific Gravity, Urine: 1.018 (ref 1.005–1.030)
Specific Gravity, Urine: 1.007 (ref 1.005–1.030)
Urobilinogen, UA: 0.2 mg/dL (ref 0.0–1.0)
Urobilinogen, UA: 0.2 mg/dL (ref 0.0–1.0)
pH: 7.5 (ref 5.0–8.0)
pH: 6.5 (ref 5.0–8.0)

## 2010-09-27 LAB — POCT I-STAT, CHEM 8
BUN: 9 mg/dL (ref 6–23)
Calcium, Ion: 1.19 mmol/L (ref 1.12–1.32)
Chloride: 109 mEq/L (ref 96–112)
Creatinine, Ser: 0.8 mg/dL (ref 0.4–1.2)
Glucose, Bld: 97 mg/dL (ref 70–99)
HCT: 34 % — ABNORMAL LOW (ref 36.0–46.0)
Hemoglobin: 11.6 g/dL — ABNORMAL LOW (ref 12.0–15.0)
Potassium: 3.9 mEq/L (ref 3.5–5.1)
Sodium: 139 mEq/L (ref 135–145)
TCO2: 19 mmol/L (ref 0–100)

## 2010-09-27 LAB — RAPID URINE DRUG SCREEN, HOSP PERFORMED
Amphetamines: NOT DETECTED
Barbiturates: NOT DETECTED
Benzodiazepines: POSITIVE — AB
Cocaine: NOT DETECTED
Opiates: POSITIVE — AB
Tetrahydrocannabinol: NOT DETECTED

## 2010-09-27 LAB — POCT PREGNANCY, URINE: Preg Test, Ur: NEGATIVE

## 2010-09-27 LAB — ETHANOL: Alcohol, Ethyl (B): 5 mg/dL (ref 0–10)

## 2010-09-28 LAB — URINE CULTURE
Colony Count: NO GROWTH
Culture  Setup Time: 201203181201
Culture: NO GROWTH

## 2010-09-29 LAB — URINALYSIS, ROUTINE W REFLEX MICROSCOPIC
Bilirubin Urine: NEGATIVE
Glucose, UA: NEGATIVE mg/dL
Hgb urine dipstick: NEGATIVE
Ketones, ur: NEGATIVE mg/dL
Nitrite: NEGATIVE
Protein, ur: NEGATIVE mg/dL
Specific Gravity, Urine: 1.013 (ref 1.005–1.030)
Urobilinogen, UA: 0.2 mg/dL (ref 0.0–1.0)
pH: 6 (ref 5.0–8.0)

## 2010-09-29 LAB — COMPREHENSIVE METABOLIC PANEL
ALT: 11 U/L (ref 0–35)
AST: 18 U/L (ref 0–37)
Albumin: 4.5 g/dL (ref 3.5–5.2)
Alkaline Phosphatase: 47 U/L (ref 39–117)
BUN: 9 mg/dL (ref 6–23)
CO2: 27 mEq/L (ref 19–32)
Calcium: 9.5 mg/dL (ref 8.4–10.5)
Chloride: 106 mEq/L (ref 96–112)
Creatinine, Ser: 0.76 mg/dL (ref 0.4–1.2)
GFR calc Af Amer: >60 mL/min (ref >60)
GFR calc non Af Amer: >60 mL/min (ref >60)
Glucose, Bld: 96 mg/dL (ref 70–99)
Potassium: 3.9 mEq/L (ref 3.5–5.1)
Sodium: 139 mEq/L (ref 135–145)
Total Bilirubin: 0.5 mg/dL (ref 0.3–1.2)
Total Protein: 6.8 g/dL (ref 6.0–8.3)

## 2010-09-29 LAB — CBC
HCT: 36.7 % (ref 36.0–46.0)
Hemoglobin: 12.9 g/dL (ref 12.0–15.0)
MCHC: 35 g/dL (ref 30.0–36.0)
MCV: 90.8 fL (ref 78.0–100.0)
Platelets: 192 10*3/uL (ref 150–400)
RBC: 4.04 MIL/uL (ref 3.87–5.11)
RDW: 13.4 % (ref 11.5–15.5)
WBC: 6.9 10*3/uL (ref 4.0–10.5)

## 2010-09-29 LAB — DIFFERENTIAL
Basophils Absolute: 0 10*3/uL (ref 0.0–0.1)
Basophils Relative: 0 % (ref 0–1)
Eosinophils Absolute: 0.8 10*3/uL — ABNORMAL HIGH (ref 0.0–0.7)
Eosinophils Relative: 12 % — ABNORMAL HIGH (ref 0–5)
Lymphocytes Relative: 36 % (ref 12–46)
Lymphs Abs: 2.5 10*3/uL (ref 0.7–4.0)
Monocytes Absolute: 0.3 10*3/uL (ref 0.1–1.0)
Monocytes Relative: 5 % (ref 3–12)
Neutro Abs: 3.2 10*3/uL (ref 1.7–7.7)
Neutrophils Relative %: 47 % (ref 43–77)

## 2010-09-29 LAB — POCT PREGNANCY, URINE: Preg Test, Ur: NEGATIVE

## 2010-10-14 LAB — URINALYSIS, ROUTINE W REFLEX MICROSCOPIC
Bilirubin Urine: NEGATIVE
Bilirubin Urine: NEGATIVE
Glucose, UA: NEGATIVE mg/dL
Glucose, UA: NEGATIVE mg/dL
Hgb urine dipstick: NEGATIVE
Ketones, ur: NEGATIVE mg/dL
Ketones, ur: NEGATIVE mg/dL
Nitrite: NEGATIVE
Nitrite: POSITIVE — AB
Protein, ur: NEGATIVE mg/dL
Protein, ur: NEGATIVE mg/dL
Specific Gravity, Urine: 1.008 (ref 1.005–1.030)
Specific Gravity, Urine: 1.013 (ref 1.005–1.030)
Urobilinogen, UA: 0.2 mg/dL (ref 0.0–1.0)
Urobilinogen, UA: 0.2 mg/dL (ref 0.0–1.0)
pH: 6 (ref 5.0–8.0)
pH: 7 (ref 5.0–8.0)

## 2010-10-14 LAB — COMPREHENSIVE METABOLIC PANEL
ALT: 12 U/L (ref 0–35)
AST: 17 U/L (ref 0–37)
Albumin: 3.6 g/dL (ref 3.5–5.2)
Alkaline Phosphatase: 40 U/L (ref 39–117)
BUN: 8 mg/dL (ref 6–23)
CO2: 26 mEq/L (ref 19–32)
Calcium: 8.6 mg/dL (ref 8.4–10.5)
Chloride: 108 mEq/L (ref 96–112)
Creatinine, Ser: 0.72 mg/dL (ref 0.4–1.2)
GFR calc Af Amer: >60 mL/min (ref >60)
GFR calc non Af Amer: >60 mL/min (ref >60)
Glucose, Bld: 92 mg/dL (ref 70–99)
Potassium: 4 mEq/L (ref 3.5–5.1)
Sodium: 138 mEq/L (ref 135–145)
Total Bilirubin: 0.3 mg/dL (ref 0.3–1.2)
Total Protein: 5.8 g/dL — ABNORMAL LOW (ref 6.0–8.3)

## 2010-10-14 LAB — URINE MICROSCOPIC-ADD ON

## 2010-10-14 LAB — CBC
HCT: 34.9 % — ABNORMAL LOW (ref 36.0–46.0)
HCT: 36.8 % (ref 36.0–46.0)
Hemoglobin: 11.5 g/dL — ABNORMAL LOW (ref 12.0–15.0)
Hemoglobin: 12.1 g/dL (ref 12.0–15.0)
MCHC: 32.7 g/dL (ref 30.0–36.0)
MCHC: 32.9 g/dL (ref 30.0–36.0)
MCV: 87.6 fL (ref 78.0–100.0)
MCV: 88 fL (ref 78.0–100.0)
Platelets: 205 10*3/uL (ref 150–400)
Platelets: 207 10*3/uL (ref 150–400)
RBC: 3.96 MIL/uL (ref 3.87–5.11)
RBC: 4.21 MIL/uL (ref 3.87–5.11)
RDW: 13.1 % (ref 11.5–15.5)
RDW: 13.3 % (ref 11.5–15.5)
WBC: 4.1 10*3/uL (ref 4.0–10.5)
WBC: 4.3 10*3/uL (ref 4.0–10.5)

## 2010-10-14 LAB — DIFFERENTIAL
Basophils Absolute: 0 10*3/uL (ref 0.0–0.1)
Basophils Absolute: 0 10*3/uL (ref 0.0–0.1)
Basophils Relative: 1 % (ref 0–1)
Basophils Relative: 1 % (ref 0–1)
Eosinophils Absolute: 0.1 10*3/uL (ref 0.0–0.7)
Eosinophils Absolute: 0.1 10*3/uL (ref 0.0–0.7)
Eosinophils Relative: 2 % (ref 0–5)
Eosinophils Relative: 3 % (ref 0–5)
Lymphocytes Relative: 32 % (ref 12–46)
Lymphocytes Relative: 32 % (ref 12–46)
Lymphs Abs: 1.3 10*3/uL (ref 0.7–4.0)
Lymphs Abs: 1.3 10*3/uL (ref 0.7–4.0)
Monocytes Absolute: 0.2 10*3/uL (ref 0.1–1.0)
Monocytes Absolute: 0.2 10*3/uL (ref 0.1–1.0)
Monocytes Relative: 6 % (ref 3–12)
Monocytes Relative: 6 % (ref 3–12)
Neutro Abs: 2.4 10*3/uL (ref 1.7–7.7)
Neutro Abs: 2.5 10*3/uL (ref 1.7–7.7)
Neutrophils Relative %: 59 % (ref 43–77)
Neutrophils Relative %: 60 % (ref 43–77)

## 2010-10-14 LAB — CULTURE, BLOOD (ROUTINE X 2)
Culture: NO GROWTH
Culture: NO GROWTH

## 2010-10-14 LAB — POCT I-STAT, CHEM 8
BUN: 8 mg/dL (ref 6–23)
Calcium, Ion: 1.22 mmol/L (ref 1.12–1.32)
Chloride: 105 mEq/L (ref 96–112)
Creatinine, Ser: 0.7 mg/dL (ref 0.4–1.2)
Glucose, Bld: 91 mg/dL (ref 70–99)
HCT: 38 % (ref 36.0–46.0)
Hemoglobin: 12.9 g/dL (ref 12.0–15.0)
Potassium: 3.8 mEq/L (ref 3.5–5.1)
Sodium: 144 mEq/L (ref 135–145)
TCO2: 26 mmol/L (ref 0–100)

## 2010-10-14 LAB — RAPID URINE DRUG SCREEN, HOSP PERFORMED
Amphetamines: NOT DETECTED
Barbiturates: NOT DETECTED
Benzodiazepines: POSITIVE — AB
Cocaine: NOT DETECTED
Opiates: POSITIVE — AB
Tetrahydrocannabinol: NOT DETECTED

## 2010-10-14 LAB — URINE CULTURE: Colony Count: 40000

## 2010-10-14 LAB — SALICYLATE LEVEL: Salicylate Lvl: <4 mg/dL (ref 2.8–20.0)

## 2010-10-14 LAB — POCT PREGNANCY, URINE: Preg Test, Ur: NEGATIVE

## 2010-10-14 LAB — ACETAMINOPHEN LEVEL: Acetaminophen (Tylenol), Serum: <10 ug/mL — ABNORMAL LOW (ref 10–30)

## 2010-10-14 LAB — HEMOGLOBIN A1C
Hgb A1c MFr Bld: 5.4 % (ref 4.6–6.1)
Mean Plasma Glucose: 108 mg/dL

## 2010-10-14 LAB — GLUCOSE, CAPILLARY: Glucose-Capillary: 96 mg/dL (ref 70–99)

## 2010-10-14 LAB — ETHANOL: Alcohol, Ethyl (B): <5 mg/dL (ref 0–10)

## 2010-10-19 LAB — URINALYSIS, ROUTINE W REFLEX MICROSCOPIC
Bilirubin Urine: NEGATIVE
Glucose, UA: NEGATIVE mg/dL
Hgb urine dipstick: NEGATIVE
Nitrite: NEGATIVE
Protein, ur: NEGATIVE mg/dL
Specific Gravity, Urine: 1.03 (ref 1.005–1.030)
Urobilinogen, UA: 1 mg/dL (ref 0.0–1.0)
pH: 7.5 (ref 5.0–8.0)

## 2010-10-19 LAB — URINE MICROSCOPIC-ADD ON

## 2010-10-19 LAB — RAPID URINE DRUG SCREEN, HOSP PERFORMED
Amphetamines: NOT DETECTED
Barbiturates: NOT DETECTED
Benzodiazepines: POSITIVE — AB
Cocaine: NOT DETECTED
Opiates: POSITIVE — AB
Tetrahydrocannabinol: NOT DETECTED

## 2010-10-19 LAB — POCT PREGNANCY, URINE: Preg Test, Ur: NEGATIVE

## 2010-10-20 LAB — URINALYSIS, ROUTINE W REFLEX MICROSCOPIC
Glucose, UA: NEGATIVE mg/dL
Hgb urine dipstick: NEGATIVE
Ketones, ur: NEGATIVE mg/dL
Nitrite: NEGATIVE
Protein, ur: NEGATIVE mg/dL
Specific Gravity, Urine: 1.02 (ref 1.005–1.030)
Urobilinogen, UA: 1 mg/dL (ref 0.0–1.0)
pH: 7.5 (ref 5.0–8.0)

## 2010-10-20 LAB — URINE CULTURE
Colony Count: NO GROWTH
Culture: NO GROWTH

## 2010-10-20 LAB — URINE MICROSCOPIC-ADD ON

## 2010-10-20 LAB — PREGNANCY, URINE: Preg Test, Ur: NEGATIVE

## 2010-10-21 LAB — URINE MICROSCOPIC-ADD ON

## 2010-10-21 LAB — URINALYSIS, ROUTINE W REFLEX MICROSCOPIC
Bilirubin Urine: NEGATIVE
Bilirubin Urine: NEGATIVE
Bilirubin Urine: NEGATIVE
Glucose, UA: NEGATIVE mg/dL
Glucose, UA: NEGATIVE mg/dL
Glucose, UA: NEGATIVE mg/dL
Hgb urine dipstick: NEGATIVE
Hgb urine dipstick: NEGATIVE
Ketones, ur: NEGATIVE mg/dL
Ketones, ur: 40 mg/dL — AB
Ketones, ur: 40 mg/dL — AB
Nitrite: NEGATIVE
Nitrite: NEGATIVE
Nitrite: NEGATIVE
Protein, ur: NEGATIVE mg/dL
Protein, ur: NEGATIVE mg/dL
Protein, ur: NEGATIVE mg/dL
Specific Gravity, Urine: 1.004 — ABNORMAL LOW (ref 1.005–1.030)
Specific Gravity, Urine: 1.008 (ref 1.005–1.030)
Specific Gravity, Urine: 1.022 (ref 1.005–1.030)
Urobilinogen, UA: 0.2 mg/dL (ref 0.0–1.0)
Urobilinogen, UA: 0.2 mg/dL (ref 0.0–1.0)
Urobilinogen, UA: 1 mg/dL (ref 0.0–1.0)
pH: 7 (ref 5.0–8.0)
pH: 6.5 (ref 5.0–8.0)
pH: 8 (ref 5.0–8.0)

## 2010-10-21 LAB — URINE CULTURE
Colony Count: NO GROWTH
Colony Count: NO GROWTH
Colony Count: NO GROWTH
Culture: NO GROWTH
Culture: NO GROWTH
Culture: NO GROWTH

## 2010-10-21 LAB — BASIC METABOLIC PANEL
BUN: 6 mg/dL (ref 6–23)
CO2: 23 mEq/L (ref 19–32)
Calcium: 9.7 mg/dL (ref 8.4–10.5)
Chloride: 108 mEq/L (ref 96–112)
Creatinine, Ser: 0.92 mg/dL (ref 0.4–1.2)
GFR calc Af Amer: >60 mL/min (ref >60)
GFR calc non Af Amer: >60 mL/min (ref >60)
Glucose, Bld: 93 mg/dL (ref 70–99)
Potassium: 3.1 mEq/L — ABNORMAL LOW (ref 3.5–5.1)
Sodium: 139 mEq/L (ref 135–145)

## 2010-10-21 LAB — POCT I-STAT, CHEM 8
BUN: <3 mg/dL — ABNORMAL LOW (ref 6–23)
Calcium, Ion: 1.1 mmol/L — ABNORMAL LOW (ref 1.12–1.32)
Chloride: 107 mEq/L (ref 96–112)
Creatinine, Ser: 1.2 mg/dL (ref 0.4–1.2)
Glucose, Bld: 90 mg/dL (ref 70–99)
HCT: 41 % (ref 36.0–46.0)
Hemoglobin: 13.9 g/dL (ref 12.0–15.0)
Potassium: 3.2 mEq/L — ABNORMAL LOW (ref 3.5–5.1)
Sodium: 138 mEq/L (ref 135–145)
TCO2: 17 mmol/L (ref 0–100)

## 2010-10-21 LAB — POCT PREGNANCY, URINE
Preg Test, Ur: NEGATIVE
Preg Test, Ur: NEGATIVE

## 2010-10-22 LAB — DIFFERENTIAL
Basophils Absolute: 0 10*3/uL (ref 0.0–0.1)
Basophils Absolute: 0 10*3/uL (ref 0.0–0.1)
Basophils Relative: 1 % (ref 0–1)
Basophils Relative: 1 % (ref 0–1)
Eosinophils Absolute: 0.1 10*3/uL (ref 0.0–0.7)
Eosinophils Absolute: 0.2 10*3/uL (ref 0.0–0.7)
Eosinophils Relative: 2 % (ref 0–5)
Eosinophils Relative: 3 % (ref 0–5)
Lymphocytes Relative: 36 % (ref 12–46)
Lymphocytes Relative: 45 % (ref 12–46)
Lymphs Abs: 1.6 10*3/uL (ref 0.7–4.0)
Lymphs Abs: 2.7 10*3/uL (ref 0.7–4.0)
Monocytes Absolute: 0.2 10*3/uL (ref 0.1–1.0)
Monocytes Absolute: 0.4 10*3/uL (ref 0.1–1.0)
Monocytes Relative: 6 % (ref 3–12)
Monocytes Relative: 7 % (ref 3–12)
Neutro Abs: 2.4 10*3/uL (ref 1.7–7.7)
Neutro Abs: 2.6 10*3/uL (ref 1.7–7.7)
Neutrophils Relative %: 44 % (ref 43–77)
Neutrophils Relative %: 56 % (ref 43–77)

## 2010-10-22 LAB — POCT I-STAT, CHEM 8
BUN: 11 mg/dL (ref 6–23)
Calcium, Ion: 1.15 mmol/L (ref 1.12–1.32)
Chloride: 108 mEq/L (ref 96–112)
Creatinine, Ser: 1 mg/dL (ref 0.4–1.2)
Glucose, Bld: 90 mg/dL (ref 70–99)
HCT: 40 % (ref 36.0–46.0)
Hemoglobin: 13.6 g/dL (ref 12.0–15.0)
Potassium: 3.4 mEq/L — ABNORMAL LOW (ref 3.5–5.1)
Sodium: 139 mEq/L (ref 135–145)
TCO2: 22 mmol/L (ref 0–100)

## 2010-10-22 LAB — URINE CULTURE
Colony Count: NO GROWTH
Colony Count: NO GROWTH
Culture: NO GROWTH
Culture: NO GROWTH

## 2010-10-22 LAB — CBC
HCT: 39.8 % (ref 36.0–46.0)
HCT: 39.9 % (ref 36.0–46.0)
Hemoglobin: 13.3 g/dL (ref 12.0–15.0)
Hemoglobin: 13.6 g/dL (ref 12.0–15.0)
MCHC: 33.4 g/dL (ref 30.0–36.0)
MCHC: 34 g/dL (ref 30.0–36.0)
MCV: 88.3 fL (ref 78.0–100.0)
MCV: 90.1 fL (ref 78.0–100.0)
Platelets: 283 10*3/uL (ref 150–400)
Platelets: 321 10*3/uL (ref 150–400)
RBC: 4.41 MIL/uL (ref 3.87–5.11)
RBC: 4.52 MIL/uL (ref 3.87–5.11)
RDW: 12.7 % (ref 11.5–15.5)
RDW: 13 % (ref 11.5–15.5)
WBC: 4.4 10*3/uL (ref 4.0–10.5)
WBC: 5.9 10*3/uL (ref 4.0–10.5)

## 2010-10-22 LAB — URINALYSIS, ROUTINE W REFLEX MICROSCOPIC
Bilirubin Urine: NEGATIVE
Bilirubin Urine: NEGATIVE
Glucose, UA: NEGATIVE mg/dL
Glucose, UA: NEGATIVE mg/dL
Hgb urine dipstick: NEGATIVE
Hgb urine dipstick: NEGATIVE
Ketones, ur: 15 mg/dL — AB
Ketones, ur: NEGATIVE mg/dL
Leukocytes, UA: NEGATIVE
Nitrite: NEGATIVE
Nitrite: NEGATIVE
Protein, ur: NEGATIVE mg/dL
Protein, ur: NEGATIVE mg/dL
Specific Gravity, Urine: 1.02 (ref 1.005–1.030)
Specific Gravity, Urine: 1.024 (ref 1.005–1.030)
Urobilinogen, UA: 0.2 mg/dL (ref 0.0–1.0)
Urobilinogen, UA: 0.2 mg/dL (ref 0.0–1.0)
pH: 8 (ref 5.0–8.0)
pH: 8 (ref 5.0–8.0)

## 2010-10-22 LAB — BASIC METABOLIC PANEL
BUN: 7 mg/dL (ref 6–23)
CO2: 24 mEq/L (ref 19–32)
Calcium: 9.3 mg/dL (ref 8.4–10.5)
Chloride: 106 mEq/L (ref 96–112)
Creatinine, Ser: 0.86 mg/dL (ref 0.4–1.2)
GFR calc Af Amer: >60 mL/min (ref >60)
GFR calc non Af Amer: >60 mL/min (ref >60)
Glucose, Bld: 94 mg/dL (ref 70–99)
Potassium: 3.6 mEq/L (ref 3.5–5.1)
Sodium: 137 mEq/L (ref 135–145)

## 2010-10-22 LAB — URINE MICROSCOPIC-ADD ON

## 2010-10-22 LAB — POCT PREGNANCY, URINE: Preg Test, Ur: NEGATIVE

## 2010-10-27 LAB — POCT PREGNANCY, URINE: Preg Test, Ur: NEGATIVE

## 2010-10-27 LAB — URINALYSIS, ROUTINE W REFLEX MICROSCOPIC
Bilirubin Urine: NEGATIVE
Glucose, UA: NEGATIVE mg/dL
Hgb urine dipstick: NEGATIVE
Nitrite: NEGATIVE
Protein, ur: NEGATIVE mg/dL
Specific Gravity, Urine: 1.037 — ABNORMAL HIGH (ref 1.005–1.030)
Urobilinogen, UA: 0.2 mg/dL (ref 0.0–1.0)
pH: 6.5 (ref 5.0–8.0)
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
## 2010-11-24 NOTE — Op Note (Signed)
[name], INCLAN NO.:  [id number]   MEDICAL RECORD NO.:  [id number]          PATIENT TYPE:  AMB   LOCATION:  NESC                         FACILITY:  [facility]   PHYSICIAN:  Jamison Neighbor, M.D.  DATE OF BIRTH:  11-26-1980   DATE OF PROCEDURE:  03/14/2007  DATE OF DISCHARGE:                               OPERATIVE REPORT   PREOPERATIVE DIAGNOSIS:  1. Interstitial cystitis.  2. Urgency incontinence.   POSTOPERATIVE DIAGNOSIS:  1. Interstitial cystitis.  2. Urgency incontinence.   PROCEDURE:  Cystoscopy, urethral calibration, hydrodistention of the  bladder, Marcaine and Pyridium installation, Marcaine and Kenalog  injection, Botox injection (3 ampules).   HISTORY:  This 30 year old female has marked urgency and frequency with  urgency incontinence.  She had previously undergone hydrodistention  which has helped.  She has had an interstim in place which right now is  not working as well her as she might like but she would like to try  something different.  She has requested a hydrodistention be performed  as it has usually given her excellent relief.  Because her frequency has  been poorly controlled we will give her another Botox injection which  she has done in the past.  She understands the risks and benefits of the  procedure and gave full informed consent.   PROCEDURE:  After successful induction of general anesthesia the patient  was placed in the dorsal position, prepped with Betadine and draped in  the usual sterile fashion.  Careful bimanual examination revealed  unremarkable urethra with no signs of diverticulum.  She had no prolapse  of any kind.  Uterus was palpably normal.  She had no cystocele,  rectocele or enterocele.  No masses on bimanual exam.  The urethra was  calibrated 32-French with female urethral sounds with no signs of  stenosis or stricture.  The cystoscope was inserted.  The bladder was  carefully inspected.  No tumors or stones  could be seen.  Both ureteral  orifices were normal in configuration and location.  Hydrodistention of  bladder was performed.  The bladder was distended at a pressure of 100  cm of water for 5 minutes.  When the bladder was drained, glomerulations  could be seen but the bladder capacity was excellent 1000 mL.  Botox  injection was performed.  The patient received a total of 300 units in a  total of 30 injections.  The patient then received Marcaine and Pyridium  in the bladder, Marcaine and Kenalog were injected periurethrally.  The  patient tolerated procedure well and was taken to the recovery room in  good condition.      Jamison Neighbor, M.D.  Electronically Signed     RJE/MEDQ  D:  03/14/2007  T:  03/14/2007  Job:  [id number]
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
## 2010-11-24 NOTE — Op Note (Signed)
[name], [name] NO.:  [id number]   MEDICAL RECORD NO.:  [id number]          PATIENT TYPE:  AMB   LOCATION:  NESC                         FACILITY:  [facility]   PHYSICIAN:  Jamison Neighbor, M.D.  DATE OF BIRTH:  1981-01-24   DATE OF PROCEDURE:  04/12/2008  DATE OF DISCHARGE:                               OPERATIVE REPORT   SERVICE:  Urology.   PREOPERATIVE DIAGNOSES:  1. Interstitial cystitis.  2. Urgency incontinence.   POSTOPERATIVE DIAGNOSES:  1. Interstitial cystitis.  2. Urgency incontinence.   PROCEDURE:  Cystoscopy, urethral calibration, hydrodistention of the  bladder, Botox injection.   SURGEON:  Jamison Neighbor, M.D.   ANESTHESIA:  General.   COMPLICATIONS:  None.   DRAINS:  None.   BRIEF HISTORY:  This 30 year old female has a history of painful bladder  syndrome felt to be consistent with interstitial cystitis.  Over time  the appearance of her hydrodistentions have improved.  She does note  that she generally does get some relief when a hydrodistention is  performed.  The patient suffers from severe problems with urgency  incontinence.  Her voiding dysfunction is exacerbated by the fact that  she does have problems with bipolar disorder and is on a number of  medications that have an adverse effect on her bladder.  The patient has  requested a Botox injection be performed.  She understands that there is  the risk of postop retention but that she knows how to handle this and  she does have an InterStim in place which has worked some up but there  are still ongoing issues of urgency and frequency that are not  completely addressed; full informed consent was obtained.   PROCEDURE IN DETAIL:  After successful induction of general anesthesia  the patient was placed in the dorsal lithotomy position, prepped with  Betadine and draped in the usual sterile fashion.  Careful bimanual  examination showed a normal urethra with no signs of  diverticulum.  There was no cystocele, rectocele or enterocele.  Her uterus was  palpably normal.  The patient did appear to be somewhat constipated  secondary to her chronic medications and at the end of the procedure was  completely disimpacted of a number of firm pellet like pieces of feces.  She certainly does not have normal bowel movements secondary to her  medications.  The patient did receive a B and O suppository as well as  Toradol and Zofran prior to the procedure.   The urethra was calibrated with a 37 [place] with female urethral sounds  with no signs of stenosis or stricture.  The cystoscope was inserted.  The bladder was carefully inspected.  No tumors or stones could be seen.  Both ureteral orifices were normal in configuration and location.  Hydrodistention of the bladder was performed.  The bladder was distended  at a pressure 100 cm of water for 5 minutes.  When the bladder was  drained very few in the way of glomerulations could be identified.  This  is a major improvement in the appearance of the bladder  indicating that  her therapy has significantly helped to improve the quality of the  mucosa.  The patient had nothing that required biopsy or fulguration.  The bladder was drained.  Botox injection was then performed.  The  received a total of 3 ampules of Botox in divided dosages.  A mixture of  Marcaine and Pyridium was left in the bladder.  Marcaine and Kenalog  were injected periurethrally.  The patient tolerated the procedure and  was taken the recovery room in good condition.  She will continue on her  current pain medication as well as her other medications for her bipolar  disorder and for her bladder.  We will also give her a short course of  doxycycline.  She will do in-and-out catheterization if unable to  urinate.      Jamison Neighbor, M.D.  Electronically Signed     RJE/MEDQ  D:  04/12/2008  T:  04/13/2008  Job:  [id number]

## 2010-11-24 NOTE — Op Note (Signed)
NAMEMARLO, [name] NO.:  [id number]   MEDICAL RECORD NO.:  [id number]          PATIENT TYPE:  AMB   LOCATION:  NESC                         FACILITY:  [facility]   PHYSICIAN:  Jamison Neighbor, M.D.  DATE OF BIRTH:  [birth year]   DATE OF PROCEDURE:  01/30/2008  DATE OF DISCHARGE:                               OPERATIVE REPORT   PREOPERATIVE DIAGNOSIS:  Interstitial cystitis.   POSTOPERATIVE DIAGNOSIS:  Interstitial cystitis.   PROCEDURE:  Cystoscopy, urethral calibration, hydrodistention of the  bladder, Marcaine and Pyridium installation, Marcaine and Kenalog  injection, Botox injection.   SURGEON:  Jamison Neighbor, M.D.   ANESTHESIA:  General.   COMPLICATIONS:  None.   DRAINS:  None.   BRIEF HISTORY:  This 30 year old female has problems with chronic  urgency, frequency, and pelvic pain.  She had been characterized as  having interstitial cystitis, but it is noted that she has a generally  fairly normal-appearing bladder with reasonable capacity and minimal  glomerulations.  The patient has an InterStim placed, but at this point  feels it is not working as well for her as she would like.  She has had  an excellent response in the past to Botox and has requested that that  be taken care of.  She does realize that she has the option of  additional revision or work on her InterStim but has requested Botox  injection be repeated for her.  She understands the risks and benefits  of the procedure and gave full informed consent.   PROCEDURE:  After successful induction of general anesthesia, the  patient was placed in the dorsal lithotomy position, prepped with  Betadine, and draped in the usual sterile fashion.  Careful bimanual  examination showed a normal urethra with no cystocele, rectocele, or  enterocele noted.  The uterus was palpably normal.  The introitus was  noted to be slightly small.  The urethra was calibrated to 32-French,  with no signs of  stenosis or stricture.  The cystoscope was inserted.  The bladder was carefully inspected.  No tumors or stones could be seen.  Both ureteral orifices were of normal configuration and location.  Hydrodistention of the bladder was performed.  The bladder was distended  at a pressure of 100 cmH2O for 5 minutes.  When the bladder was drained,  there were minimal granulations and a normal capacity of 1000 mL.  As  the patient had not had a previous good response in terms of management  from hydrodistention, we would certainly characterized this as being not  consistent with standard interstitial cystitis.  The patient underwent  Botox injection.  A total of 2 ampules were injected at 20 separate  sites.  The patient tolerated this without difficulty.  Marcaine and  Pyridium were left in the bladder.  Marcaine and Kenalog were injected  periurethrally.  The patient had a B & O suppository as well as  intraoperative Toradol and  Zofran.  She will return to see me in followup in 2 weeks' time for  assessment of the results, at which point we  will also attempt to  determine why she is having a problem with her InterStim programmer.  It  should be noted that the patient informed me about this just this  morning prior to the procedure.      Jamison Neighbor, M.D.  Electronically Signed     RJE/MEDQ  D:  01/30/2008  T:  01/30/2008  Job:  [id number]

## 2010-11-27 NOTE — Consult Note (Signed)
   [name], HEAPS NO.:  [id number]   MEDICAL RECORD NO.:  [id number]                   PATIENT TYPE:  REC   LOCATION:  TPC                                  FACILITY:  [facility]   PHYSICIAN:  Hans C. Stevphen Rochester, M.D.                DATE OF BIRTH:  [name] 30, 1982   DATE OF CONSULTATION:  05/08/2002  DATE OF DISCHARGE:                                   CONSULTATION   REASON FOR CONSULTATION:  The patient comes in to pain management to  evaluate and review her health history form and 14-point review of systems.   DISPOSITION:  1. The patient comes in today and we have made some progress with Avinza.     She comes in with her medication.  She is appropriate.  States does not     wish to harm self or others.  In general, affect is brighter and I think     we are getting better control of her pain.  To this end, I will go ahead     and renew.  2. Tramadol will be trialed for breakthrough.  3. She understands the risks of these medications, potential habituating     nature and overall efforts to control her pain is not going to be without     potential risks and she understands this and accepts.  The risk or     benefits with Avinza I think is in her favor and she understands the     overall directive care approach to move to nonnarcotic medication     alternatives to breakthrough over time if possible.  Objectively, no     significant change in neurological or musculoskeletal presentation.   IMPRESSION:  1. Interstitial cystitis.  2. Myofacial pain syndrome.  3. Otherwise unchanged.   PLAN:  Conservative management.  Instruct her to maintain contact with Dr.  Logan Bores.                                                 Hans C. Stevphen Rochester, M.D.    [facility]  D:  05/08/2002  T:  05/09/2002  Job:  [id number]
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
## 2010-11-27 NOTE — Consult Note (Signed)
NAME:  [name], Esses                            ACCOUNT NO.:  [id number]   MEDICAL RECORD NO.:  [id number]                    PATIENT TYPE:   LOCATION:                                       FACILITY:   PHYSICIAN:  Hans C. Stevphen Rochester, M.D.                DATE OF BIRTH:   DATE OF CONSULTATION:  04/10/2002  DATE OF DISCHARGE:                                   CONSULTATION   HISTORY:  Taren Naas comes in for pain management today.  I evaluate her  health and history form, and 14-point review of systems.   I spent a considerable period of time in a chaperoned environment discussing  treatment limitations and options, reviewing of notes, and discussing  directed care approach.   SUMMARY:  1. Ms. Fiorentino is coming in today with a high level of pain behavior and     somewhat anxious.  She brings her medication in, she is appropriate, but     stating that her pain her escalating.  She has been to the ER and has     apparently been approved to go ahead and take two Percocet for     breakthrough.  This is fine, and I ask her to maintain contact with     primary care as well.  2. She is apparently involved in psychiatric evaluation, and will have     records as available and as appropriate.  3. She is considering further surgical correction, and is asking many     questions about pump implantation.  Would consider pump implantation, but     at this time she should have minimally invasive approaches exhausted     prior to moving to this arena.  I am not sure a pump in a 30 year old is     as much of a positive experience as we would want without clear     psychiatric assessment, understanding the patient's directed goals, and     understanding if medications are tolerated in the intrathecal space with     this technology.  Interstitial cystitis is a narcotic resistant problem,     and would require co-analgesics.  4. I also think that a hypogastric plexus block may be useful and would be  considered at this point as she is not being controlled well with     medications.  We have some latitude in this regard.  She does have a     borderline personality.  We have to be mindful of habituating and     potentially harmful medications in a risk environment.   OBJECTIVE:  She has no significant change in neurological, musculoskeletal  presentation.  No flank tenderness.  Discomfort suprapubic.   IMPRESSION:  1. Interstitial cystitis.  2. Borderline personality.  3. Anxiety disorder.   PLAN:  Maintain contact with Dr. Logan Bores.  Consider hypogastric plexus block.  Consider pharmacologic adjustment  and introducing Topamax and anxiolytic.  We would defer to co-managing physician, a psychiatrist, to help in this  regard.  See her in follow up.  Discharge instructions given.                                              Hans C. Stevphen Rochester, M.D.   [facility]  D:  04/10/2002  T:  04/10/2002  Job:  [id number]   cc:   Jamison Neighbor, M.D.

## 2010-11-27 NOTE — Consult Note (Signed)
NAMEUNA, YEOMANS NO.:  [id number]   MEDICAL RECORD NO.:  [id number]                   PATIENT TYPE:  REC   LOCATION:  [facility]                                  FACILITY:  [facility]   PHYSICIAN:  Hans C. Stevphen Rochester, M.D.                DATE OF BIRTH:  [birth year]   DATE OF CONSULTATION:  04/24/2002  DATE OF DISCHARGE:                                   CONSULTATION   HISTORY:  [name] comes in for pain management today as a work-in  appointment.  We have contacted Dr. Logan Bores' office, we have reviewed the  chart, we asked her to bring her medications, and reviewed patient care  contract and agreement in detail with her.  Chaperoned visit, at no time was  the physician left unattended.   1. Sung comes in today and we have a lengthy discussion as to expectations     and overall directed care approach when prescribing narcotics for chronic     nonmalignant pain.  Apparently she occasionally uses the ER for pain     control, which is fine, but we need to clearly understand if medications     are being written from multiple sources.  Apparently the ER has given her     medications in the past and she has tended to use the ER for symptom     control, but we would like to assess her whether we are affording her     good relief cycling or not, and this requires physician visits.  We will     monitor usage.  2. There are no barriers to understanding as to patient care agreement, and     we will go ahead and be happy to write her pain medications for her, but     she has to understand there will be some adjustments in the interim.  She     states OxyContin and oxycodone do not help her but induce sweating and     dysphoria, she has tried multiple other medications, but does tolerate     morphine and as noted she has had this in the emergency department.  3. To this end, I think it is reasonable to trial her on Avinza and then     reassess her.  She does have a  troublesome narcotic resistant problem and     although we consider some interventional procedures, I am going to go     ahead and hold off until we can get a handle on symptom control with     medications.  Risks, complications and options of these medications are     reviewed.  She understands the habituating nature and risks of these     medications.  States no wish to harm herself or others, and agrees to     patient care agreement.   OBJECTIVE:  No significant  change in neurologic or musculoskeletal  presentation.   IMPRESSION:  1. Interstitial cystitis.  2. __________  characteristics.  3. Anxiety disorder.   PLAN:  1. Continue with Dr. Logan Bores, her Psychiatric care Armella Stogner, and with Pain     Clinic with the understanding these medications will be provided with     monitored vigilance.  She is to obtain pain medication from only one     source, that being [place], and one dispensing pharmacy.  2. Do not believe further imaging or diagnostics are warranted.  I will hold     off interventional procedures.  3. Will see her in follow up in two weeks and she is to bring her     medications in.  She does bring her medications in and she seems to be     appropriate, but becomes tearful when we explain to her that we want to     have a real understanding if she is utilizing the emergency department     for symptom control.   We are at compassionate arena with discussions in a nursing supervised  environment.  Discharge instructions given.  Extensive consultation.  She  understands.                                               Hans C. Stevphen Rochester, M.D.    [facility]  D:  04/24/2002  T:  04/25/2002  Job:  [id number]   cc:   Dr. Logan Bores, Psychiatry

## 2010-11-27 NOTE — Op Note (Signed)
[name], [name] NO.:  [id number]   MEDICAL RECORD NO.:  [id number]          PATIENT TYPE:  AMB   LOCATION:  DAY                          FACILITY:  [facility]   PHYSICIAN:  Jamison Neighbor, M.D.  DATE OF BIRTH:  02/01/1981   DATE OF PROCEDURE:  08/13/2005  DATE OF DISCHARGE:                                 OPERATIVE REPORT   SERVICE:  Urology.   DATE OF THE PROCEDURE:  July 13, 2005.   PREOPERATIVE DIAGNOSIS:  Interstitial cystitis.   SECONDARY DIAGNOSIS:  Urgency incontinence.   POSTOPERATIVE DIAGNOSES:  1.  Interstitial cystitis.  2.  Urgency incontinence.   PROCEDURE:  1.  Cystoscopy.  2.  Urethral calibration.  3.  Hydrodistention of the bladder.  4.  Botox injection.  5.  Marcaine and Pyridium instillation.  6.  Marcaine and Kenalog injection.   SURGEON:  Jamison Neighbor, M.D.   ANESTHESIA:  General.   COMPLICATIONS:  None.   DRAINS:  None.   BRIEF HISTORY:  This 30 year old female has known interstitial cystitis.  She has also had a longstanding problem with urgency incontinence.  The  patient initially did well at the [facility], she has not done as well as we  might like.  She is back to voiding 30 or more times a day.  We have  reprogrammed her and she does feel this in the appropriate place but we are  not seeing as much of a decrease in the urinary urgency and frequency.  She  has been on anticholinergics without much improvement and we are now to the  point we feel that she does not have adequate control of her urgency  incontinence.  In addition, she has had some worsening problems with her  Elmiron, despite very aggressive therapy, which consists of Elmiron, Zyrtec,  oxycodone, Klonopin, and Cymbalta.  The patient has requested repeat  hydrodistention be performed.  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  She is aware of the fact that this cause  problems with retention, it could  last a significant period of time and that she may need to do  catheterizations.  The patient understands the risks and benefits of the  procedure and gave full informed consent.   PROCEDURE:  After successful induction of general anesthesia, the patient  was placed in the dorsal lithotomy position, prepped with Betadine and  draped in the usual sterile fashion.  Camera bimanual examination revealed  no abnormalities of the urethra, there was no signs of a diverticulum.  She  had no significant cystocele, rectocele or enterocele.  There were no masses  on bimanual exam.  The cystoscope was inserted.  The urethra had been  calibrated to 32 [place], indicating no stenosis or stricture.  The bladder  was carefully inspected, no tumors or stones could be seen, ureteral  orifices were unremarkable and clear urine came from each.  Hydrodistention  of the bladder was then performed.  The bladder was distended at a  pressure  of 100 cmH2O for 5 minutes.  When the bladder was drained, the patient had  marked glomerulation throughout the bladder, consistent with her condition,  but, fortunately, has not had much loss of volume, holding just over 1000  mL, which is nearly normal.  The bladder was drained.  The Botox injection  was then performed.  The patient had a total of 2 vials of Botox injection  and a total of 20 injections of 1 mL apiece.  The patient had these  scattered throughout the left side, right side and middle as well as  including the trigone.  The patient had her bladder drained, a mixture of  Marcaine and Pyridium _ was left in the bladder, Marcaine and Kenalog were  injected periurethrally.  The patient tolerated the procedure well and was  taken to the recovery room in good condition.  She will be sent home with  additional pain medication as well as antibiotic therapy and will return to  see [place] in followup in 2 weeks.            ______________________________  Jamison Neighbor, M.D.  Electronically Signed     RJE/MEDQ  D:  08/13/2005  T:  08/13/2005  Job:  [id number]   cc:   Jamison Neighbor, M.D.  Fax: [phone]
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
## 2010-11-27 NOTE — Op Note (Signed)
[name], [name] NO.:  [id number]   MEDICAL RECORD NO.:  [id number]          PATIENT TYPE:  AMB   LOCATION:  [facility]                         FACILITY:  [facility]   PHYSICIAN:  Jamison Neighbor, M.D.  DATE OF BIRTH:  1981-01-22   DATE OF PROCEDURE:  03/09/2005  DATE OF DISCHARGE:                                 OPERATIVE REPORT   SERVICE:  Urology.   PREOPERATIVE DIAGNOSIS:  Interstitial cystitis.   POSTOPERATIVE DIAGNOSIS:  Interstitial cystitis.   PROCEDURE:  Cystoscopy, urethral calibration, hydrodistention of the  bladder, Marcaine and Pyridium installation, Marcaine and Kenalog injection.   SURGEON:  Jamison Neighbor, M.D.   ANESTHESIA:  General.   COMPLICATIONS:  None.   DRAINS:  None.   BRIEF HISTORY:  This 30 year old female has known interstitial cystitis. The  patient has not done particularly well with installation therapy. She had  been taking medications but discontinued those. She also has an InterStim in  place which was reprogrammed recently. The patient feels that her  stimulation is in the appropriate area but she has developed a return of her  IC symptoms and has a complete loss of her urinary control. She says she is  voiding 40 or more times a day and 5-6 times a night with increasing pelvic  pain. She was willing to restart on medications and has had approximately  10% improvement in her symptoms. She is on a combination of Zyrtec, Elmiron,  Cymbalta . Trying to not only control her IC symptoms but attempt to try and  control her frequency, the patient has requested a repeat hydrodistention.  She has done well with these in the past but certainly is aware of the fact  that there is no guarantee she will have comparable improvement this time  and also is aware of the fact that whatever improvement she has is certainly  not permanent. Full and informed consent.   DESCRIPTION OF PROCEDURE:  After successful induction of general  anesthesia,  the patient was placed in the dorsal lithotomy position, prepped with  Betadine and draped in the usual sterile fashion. Careful bimanual  examination showed a normal urethra but no signs of a diverticulum. The  uterus was palpably normal. There was no significant prolapse of any kind.  There was no cystocele, rectocele or enterocele. There was no masses on  bimanual exam. There was no discharge from the vagina or urethra. The  urethra was calibrated at 62 [place] with female urethral sounds with no  evidence of stenosis or stricture. The cystoscope was inserted, the bladder  was carefully inspected and was free of any tumor or stones. Both ureteral  orifices were normal in configuration and location. Clear urine was seen to  efflux from each. The bladder was distended at a pressure of 100 cmH2O for 5  minutes. When the bladder was drained, glomerulations could be seen  throughout the bladder. There were no linear tears and there was no evidence  of ulcer formation. The patient has a bladder capacity of approximately 950  mL which is really fairly close to  normal but there was a significant  terminal blood tinge consistent with an interstitial cystitis diagnosis. Her  bladder capacity of 950 compares favorably to a normal bladder capacity of  1150 and is much better than the average IC bladder capacity of 575. The  patient does not require a biopsy at this time. A mixture of Marcaine and  Pyridium was left within the bladder, a mixture of Marcaine and Kenalog was  injected periurethrally. The patient received a B&O suppository as well as  intraoperative Toradol and Zofran. A small gauze pack was placed. The  patient tolerated the procedure well and was taken to the recovery room in  good condition. She will be sent home with Tylox, Urelle and doxicycline and  return to the office in several weeks. At that point if she has had no  improvement, will have to talk with her about  installation therapy and the  patient may require additional InterStim programming.           ______________________________  Jamison Neighbor, M.D.  Electronically Signed     RJE/MEDQ  D:  03/09/2005  T:  03/09/2005  Job:  [id number]
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
## 2010-11-27 NOTE — Discharge Summary (Signed)
[place]. [facility]  Patient:    [name], [name]                       MRN: [id number] Adm. Date:  [id number] Disc. Date: 06/09/00 Attending:  Carrie Mew Dictator:   Cornell Barman, P.A. CC:         Dr. Starleen Blue. Williford, M.D.  Angelena Sole, M.D. [facility]   Discharge Summary  DISCHARGE DIAGNOSES: 1. Respiratory insufficiency. 2. Tachycardia. 3. Drug overdose. 4. Suicide attempt.  BRIEF ADMISSION HISTORY:  [name] is a 30 year old white female who took an overdose of medications on June 06, 2000.  Apparently she took tricyclics, benzodiazepines, and Risperdal.  PAST MEDICAL HISTORY:  1. Chronic pain.  2. History of depression. 3. Interstitial cystitis.  4. Bipolar disorder.  PAST PSYCHIATRIC HISTORY:  The patient has had multiple admissions for her bipolar admission.  Her last admission was in September of 2001.  She has also had multiple suicide attempts.  HOSPITAL COURSE:  Apparent drug overdose with suicide attempt leading to renal insufficiency and requiring intubation and mechanical ventilation.  The patient was successfully extubated on June 08, 2000.  The patient remained somewhat tachycardic, likely from the tricyclics.  The patient was otherwise stable and transferred to the medical telemetry floor for further monitoring. A psychiatry consult was called on June 08, 2000, and James S. Williford, M.D., did come and see the patient.  His assessment was bipolar II disorder and depression.  He did assess the patient to be at risk to harm herself and others and recommended psychiatric admission for further intensive evaluation and treatment.  Arrangements were made for the patient to transfer to the [facility]  Unfortunately on the evening of June 08, 2000, the patient again attempted suicide in the bathroom of her hospital room.  The patient was under suicide precautions and did have a Comptroller.   The patient was found in the bathroom and had the gown tied to the shower curtain pole with the gown around her neck.  No further gestures were attempted during this hospitalization and she was transferred to the [facility].  LABORATORIES:  At the time of discharge, UA was negative.  Her drug screen was positive for tricyclics, barbituates, and benzodiazepine.  The acetaminophen level was less than 10.  The alcohol level was less than 10.  The potassium was 3.  Hemoglobin 13.4, hematocrit 38.  MEDICATIONS AT DISCHARGE:  To be determined by Adelene Amas. Williford, M.D.  Her medications on admission included Elmiron 100 mg t.i.d., Ditropan XL 10 mg b.i.d., Neurontin 800 mg q.i.d., amitriptyline 150 mg q.h.s., Ultram 50 mg t.i.d., Alprazolam 0.5 mg p.r.n., ______, ______ 100 mg b.i.d., Minocin 100 mg b.i.d., Allegra as needed, hydrocodone 5/500 mg p.r.n., and Pyridium needed.  As noted, we deferred the continuation of these medications to her psychiatrist. DD:  06/09/00 TD:  06/09/00 Job: [id number]
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
## 2010-11-27 NOTE — Op Note (Signed)
NAME:  GILBERTO, STRECK                          ACCOUNT NO.:  [id number]   MEDICAL RECORD NO.:  [id number]                   PATIENT TYPE:  AMB   LOCATION:  [facility]                                 FACILITY:  [facility]   PHYSICIAN:  Jamison Neighbor, M.D.               DATE OF BIRTH:  [birth year]   DATE OF PROCEDURE:  04/23/2003  DATE OF DISCHARGE:                                 OPERATIVE REPORT   PREOPERATIVE DIAGNOSIS:  Urge incontinence.   POSTOPERATIVE DIAGNOSIS:  Urge incontinence.   PROCEDURE:  Second-stage Interstim implant.   SURGEON:  Jamison Neighbor, M.D.   ASSISTANT:  Susanne Borders, M.D.   ANESTHESIA:  General laryngeal mask.   COMPLICATIONS:  None.   ESTIMATED BLOOD LOSS:  Minimal.   DISPOSITION:  To postanesthesia care unit in stable condition.   INDICATION FOR PROCEDURE:  [name] is a 30 year old female who has a history  of severe urinary urgency and frequency due to her interstitial cystitis.  She recently underwent placement of a first-stage test Interstim device.  The patient has noted improvement with the device and wishes to proceed with  the permanent implantation of the Interstim generator.  She consented to  this after understanding the risks, benefits, and alternatives.   DESCRIPTION OF PROCEDURE:  The patient was brought to the operating room and  correctly identified by her identification bracelet.  She was placed in the  prone position and given laryngeal mask airway anesthesia.  The old dressing  was removed and the patient was given a 10-minute Betadine soap scrub.  An  approximately 6 cm incision was made in the skin with a scalpel on the  patient's left side at the level of her sacrum.  The skin was further  incised with the Bovie electrocautery.  Bluntly a subcutaneous pocket was  made for placement of the generator.  The permanent lead was removed from  the test lead and connected to the lead extender.  The screws were fastened  until they were tied  with an extra quarter-turn on each.  The lead extender  was then connected to the generator and the screws were fastened in a  similar fashion.  The generator fit nicely into the subcutaneous pocket  which had been created.  The subcutaneous tissue was closed with a running 3-  0 Vicryl.  The skin was closed with a running 3-0 nylon.  The old test lead  was removed at the end of the procedure from the patient's right side.  A  sterile dressing was applied, and the patient was awakened from her  anesthesia without complications.  She was taken to the  postanesthesia care unit in stable condition.  The patient will undergo  impedance testing of the device in the postanesthesia care unit by the  Interstim representative.  The patient is being discharged home with Tylox  and Keflex.  She will follow up  with Dr. Logan Bores in a week to 10 days to have  her stitches removed.     Susanne Borders, MD                           Jamison Neighbor, M.D.    DR/MEDQ  D:  04/23/2003  T:  04/23/2003  Job:  [id number]

## 2010-11-27 NOTE — Discharge Summary (Signed)
[facility]  Patient:    [name], [name]                       MRN: [id number] Adm. Date:  [id number] Disc. Date: [id number] Attending:  Denny Peon                           Discharge Summary  HISTORY OF PRESENT ILLNESS:  [name] is a 30 year old single white female with a long history of depression, personality problems who was admitted after overdose on Darvocet, oxycodone and Xanax in suicidal attempt.  She has history of hospitalization in July of 2001, and in [place] of 2001, respectively, at [facility] and [place].  She has history of treatment in outpatient setting for years.  Details of the patients history are available in admission note.  HOSPITAL COURSE:  After admission to the ward, the patient was placed on special observation.  She was started back on Luvox and Xanax.  Neurontin was also reintroduced in dose of 800 mg three times a day and 600 mg at bedtime. On April 04, 2000, she felt better. She had good meeting with grandmother. She denied dangerous thought and hallucinations.  On April 05, 2000, she remained pleasant.  Adjusted dose of Depakote because she still obsessed about her physical problems and about her past.  Luvox was increased to 25 mg twice a day and 75 mg at bedtime.  On April 05, 2000, she presented with bright, pleasant affect, reported good sleep, improved appetite.  No incidents of self-injuries and no suicidal ideas.  The patient seemed to be understanding better the nature of her illness and was able to promise safety.  The decision was made to discharge her in the care of her grandmother.  MEDICAL PROBLEMS DURING THIS BRIEF HOSPITALIZATION:  The patient did not have any medical problems.   Vital signs were stable with blood pressure 105/68, pulse 84, and normal temperature and respiratory rate.  LABS:  Normal CBC and differential.  Normal comprehensive metabolic  profile. Urine pregnancy test negative.  The level of acetaminophen was up on on admission at 16.2 which was within therapeutic range.  Drug screen was positive for barbiturates and opiates.  DISCHARGE DIAGNOSES: AXIS I.   1. Major depression, recurrent, moderate.           2. Post traumatic stress disorder. AXIS II.  Borderline personality disorder. AXIS III. 1. Interstitial cystitis.           2. Fibromyalgia.           3. Status post overdose in suicidal attempt. AXIS IV.  Multiple stressors, primary support group, medical problems AXIS V.   Global Assessment of Functioning upon admission 30, maximum for the           past year of 55, upon discharge 50.  DISCHARGE RECOMMENDATIONS:  The patient will be discharged on the following medications:  1. Luvox 25 mg one in the morning, at noon and three tablets at night.  2. Neurontin 800 mg one tablet three times a day and two tablets at night.  3. Xanax 0.5 mg up to twice a day only if needed for anxiety.  4. Risperdal 0.5 mg half in the morning and half at 4 p.m. and one at     bedtime.  5. Tylox as needed for pain.  6. Elmiron 100 mg one tablet three  times a day.  7. Allegra 180 mg one in the morning.  8. Amitriptyline 25 mg at night.  9. Ditropan XL 10 mg twice a day. 10. ____________ 50 mg twice a day. The patients family will secure her medications since the patient has a history of habitual overdosing.  [facility] if problems with medication or recurrence of dangerous thoughts.  She has an appointment with urologist, Dr. ________, on  April 19, 2000, at 10:50 in the morning and [facility] April 12, 2000, at 1 p.m.  Prior to discharge, her medications were explained to her, especially side effects from Risperdal including long-term side effects and possible history of carotid dyskinesia.  Risperdal seemed to be helping with the patients impulsivity and recurrent nightmares.  PROGNOSIS:   Guarded. Depends on the patients compliance with medication and overall treatment and her willingness to move on with her life instead of hanging onto the issues of the past. DD:  04/25/00 TD:  04/25/00 Job: [id number]
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
## 2010-11-27 NOTE — H&P (Signed)
[facility]  Patient:    [name], [name]                       MRN: [id number] Adm. Date:  [id number] Attending:  Denny Peon                   Psychiatric Admission Assessment  NO DICTATION DICTATORS NUMBER INCORRECT DD:  06/11/99 TD:  06/11/00 Job: [id number]
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
## 2010-11-27 NOTE — Op Note (Signed)
NAME:  [name], [name]                          ACCOUNT NO.:  [id number]   MEDICAL RECORD NO.:  [id number]                   PATIENT TYPE:  AMB   LOCATION:  DAY                                  FACILITY:  [facility]   PHYSICIAN:  Jamison Neighbor, M.D.               DATE OF BIRTH:  1980-10-29   DATE OF PROCEDURE:  04/09/2003  DATE OF DISCHARGE:                                 OPERATIVE REPORT   PREOPERATIVE DIAGNOSIS:  Urgency incontinence.   POSTOPERATIVE DIAGNOSIS:  Urgency incontinence.   PROCEDURE:  First InterStim implantation.   SURGEON:  Jamison Neighbor, M.D.   ASSISTANT:  Susanne Borders, MD   ANESTHESIA:  IV sedation with local anesthetic.   COMPLICATIONS:  None.   DRAINS:  Foley catheter removed postoperatively.   BRIEF HISTORY:  This 30 year old female is known to have urgency and  incontinence.  The patient has failed to respond to standard medications  including Oxitrol, Ditropan and Detrol.  She has undergone evaluation for  interstitial cystitis and the pain component of her condition is controlled  but the urgency incontinence has not improved in any way.  Because she has  failed to respond to standard therapy, she is a good candidate for InterStim  implantation.  She understands the risks and benefits of the procedure.  In  particular, she is aware of the fact that this is a staged approach and if  she does not respond to the first stage implant, the device will be removed  and the permanent generator will not be installed.  She gave full and  informed consent.   PROCEDURE:  After successful induction of adequate IV sedation, the patient  was placed in the prone position.  She was fully padded.  She received a  full 10 minute scrub and paint for preparation.  She was then draped in the  usual fashion including a Viadrape.  The drapes were placed and the buttocks  were pulled apart and the sphincter was exposed.  Fluoroscopy was used to  identify the approximate  level of the third sacral nerve.  The area over  that landmark was infiltrated with local anesthetic.  A needle was passed in  to what was hoped to be the third sacral nerve but fluoroscopy indicated  this was likely to be the fourth sacral nerve.  Stimulation of that nerve  did not illicit either a bellow's response or dorsiflexion of the great toe.  The foramen one level higher was then approached and excellent dorsiflexion  of the great toe was obtained.  Going one level above that, toe rotation was  obtained guaranteeing that the selective foramen was S3.  The response was  better on the patient's right hand side than the left so that was the  selected lead.  Fluoroscopy indicated good placement with a nice medial to  lateral deviation of the needle.  The area received  additional local  infiltration of both lidocaine and Marcaine.  A stab incision was then made  in preparation for passage of the dilating system.  The dilator was passed  over the wire and the trocar was removed.  The quadripolar lead was then  passed through the sheath which was pulled back exposing the four leads.  This was then pulled back in fluoroscopy to an appropriate position. With  stimulation there was excellent dorsiflexion of the toe on leads 1, 2, and 3  indicating optimal placement.  The tines were deployed and the device was  still in excellent position.  A small incision was then made out laterally  and the lead was passed from that medial incision out to the lateral  incision.  The lead extender was then passed from the lateral incision in  the top portion of the left buttock out to a stab wound in the right  buttock.  The connection was then made in the usual fashion.  The booty was  tied down with silk sutures.  The connection was passed into the small  pocket that had been made in the left buttock site.  That small pocket was  irrigated with antibiotic solution.  Thus, the puncture hole was then  closed  with a 2-0 Vicryl in the subcutaneous tissue and a 3-0 nylon skin stitch.  A  3-0 nylon was also placed in the paramedian incision.  The patient tolerated  the procedure well and was taken to the recovery room in good condition.                                               Jamison Neighbor, M.D.    RJE/MEDQ  D:  04/09/2003  T:  04/09/2003  Job:  [id number]

## 2011-01-06 ENCOUNTER — Emergency Department (HOSPITAL_COMMUNITY)
Admission: EM | Admit: 2011-01-06 | Discharge: 2011-01-06 | Disposition: A | Discharge status: Home / Self Care | Payer: Medicare Other | Attending: Emergency Medicine | Admitting: Emergency Medicine

## 2011-01-06 DIAGNOSIS — R110 Nausea: Secondary | ICD-10-CM | POA: Insufficient documentation

## 2011-01-06 DIAGNOSIS — F319 Bipolar disorder, unspecified: Secondary | ICD-10-CM | POA: Insufficient documentation

## 2011-01-06 DIAGNOSIS — N390 Urinary tract infection, site not specified: Secondary | ICD-10-CM | POA: Insufficient documentation

## 2011-01-06 DIAGNOSIS — G43909 Migraine, unspecified, not intractable, without status migrainosus: Secondary | ICD-10-CM | POA: Insufficient documentation

## 2011-01-06 LAB — URINALYSIS, ROUTINE W REFLEX MICROSCOPIC
Bilirubin Urine: NEGATIVE
Glucose, UA: NEGATIVE mg/dL
Ketones, ur: 15 mg/dL — AB
Leukocytes, UA: NEGATIVE
Nitrite: NEGATIVE
Protein, ur: NEGATIVE mg/dL
Specific Gravity, Urine: 1.02 (ref 1.005–1.030)
Urobilinogen, UA: 0.2 mg/dL (ref 0.0–1.0)
pH: 8 (ref 5.0–8.0)

## 2011-01-06 LAB — POCT I-STAT, CHEM 8
BUN: 17 mg/dL (ref 6–23)
Calcium, Ion: 1.1 mmol/L — ABNORMAL LOW (ref 1.12–1.32)
Chloride: 111 mEq/L (ref 96–112)
Creatinine, Ser: 0.8 mg/dL (ref 0.50–1.10)
Glucose, Bld: 87 mg/dL (ref 70–99)
HCT: 41 % (ref 36.0–46.0)
Hemoglobin: 13.9 g/dL (ref 12.0–15.0)
Potassium: 3.3 mEq/L — ABNORMAL LOW (ref 3.5–5.1)
Sodium: 140 mEq/L (ref 135–145)
TCO2: 19 mmol/L (ref 0–100)

## 2011-01-06 LAB — URINE MICROSCOPIC-ADD ON

## 2011-01-08 LAB — URINE CULTURE
Colony Count: 60000
Culture  Setup Time: 201206271303

## 2011-01-27 ENCOUNTER — Emergency Department (HOSPITAL_COMMUNITY)
Admission: EM | Admit: 2011-01-27 | Discharge: 2011-01-28 | Disposition: A | Discharge status: Home / Self Care | Payer: Medicare Other | Attending: Emergency Medicine | Admitting: Emergency Medicine

## 2011-01-27 DIAGNOSIS — R112 Nausea with vomiting, unspecified: Secondary | ICD-10-CM | POA: Insufficient documentation

## 2011-01-27 DIAGNOSIS — N898 Other specified noninflammatory disorders of vagina: Secondary | ICD-10-CM | POA: Insufficient documentation

## 2011-01-27 DIAGNOSIS — N3010 Interstitial cystitis (chronic) without hematuria: Secondary | ICD-10-CM | POA: Insufficient documentation

## 2011-01-27 LAB — URINALYSIS, ROUTINE W REFLEX MICROSCOPIC
Bilirubin Urine: NEGATIVE
Glucose, UA: NEGATIVE mg/dL
Glucose, UA: NEGATIVE mg/dL
Hgb urine dipstick: NEGATIVE
Hgb urine dipstick: NEGATIVE
Ketones, ur: >80 mg/dL — AB
Ketones, ur: >80 mg/dL — AB
Leukocytes, UA: NEGATIVE
Nitrite: NEGATIVE
Nitrite: NEGATIVE
Protein, ur: NEGATIVE mg/dL
Protein, ur: 30 mg/dL — AB
Specific Gravity, Urine: 1.034 — ABNORMAL HIGH (ref 1.005–1.030)
Specific Gravity, Urine: 1.03 (ref 1.005–1.030)
Urobilinogen, UA: 0.2 mg/dL (ref 0.0–1.0)
Urobilinogen, UA: 0.2 mg/dL (ref 0.0–1.0)
pH: 5.5 (ref 5.0–8.0)
pH: 5 (ref 5.0–8.0)

## 2011-01-27 LAB — URINE MICROSCOPIC-ADD ON

## 2011-01-27 LAB — PREGNANCY, URINE: Preg Test, Ur: NEGATIVE

## 2011-01-28 LAB — GC/CHLAMYDIA PROBE AMP, GENITAL
Chlamydia, DNA Probe: NEGATIVE
GC Probe Amp, Genital: NEGATIVE

## 2011-01-28 LAB — WET PREP, GENITAL
Trich, Wet Prep: NONE SEEN
WBC, Wet Prep HPF POC: NONE SEEN
Yeast Wet Prep HPF POC: NONE SEEN

## 2011-02-12 ENCOUNTER — Ambulatory Visit: Payer: Medicare Other | Admitting: Internal Medicine

## 2011-03-23 ENCOUNTER — Emergency Department (HOSPITAL_COMMUNITY)
Admission: EM | Admit: 2011-03-23 | Discharge: 2011-03-24 | Disposition: A | Discharge status: Home / Self Care | Payer: Medicare Other | Attending: Emergency Medicine | Admitting: Emergency Medicine

## 2011-03-23 DIAGNOSIS — Z79899 Other long term (current) drug therapy: Secondary | ICD-10-CM | POA: Insufficient documentation

## 2011-03-23 DIAGNOSIS — R300 Dysuria: Secondary | ICD-10-CM | POA: Insufficient documentation

## 2011-03-23 DIAGNOSIS — R10819 Abdominal tenderness, unspecified site: Secondary | ICD-10-CM | POA: Insufficient documentation

## 2011-03-23 DIAGNOSIS — N3010 Interstitial cystitis (chronic) without hematuria: Secondary | ICD-10-CM | POA: Insufficient documentation

## 2011-03-24 LAB — URINALYSIS, ROUTINE W REFLEX MICROSCOPIC
Bilirubin Urine: NEGATIVE
Glucose, UA: NEGATIVE mg/dL
Hgb urine dipstick: NEGATIVE
Ketones, ur: 15 mg/dL — AB
Leukocytes, UA: NEGATIVE
Nitrite: NEGATIVE
Protein, ur: NEGATIVE mg/dL
Specific Gravity, Urine: 1.014 (ref 1.005–1.030)
Urobilinogen, UA: 0.2 mg/dL (ref 0.0–1.0)
pH: 7.5 (ref 5.0–8.0)

## 2011-03-24 LAB — PREGNANCY, URINE: Preg Test, Ur: NEGATIVE

## 2011-03-24 LAB — URINE MICROSCOPIC-ADD ON

## 2011-03-25 ENCOUNTER — Inpatient Hospital Stay (INDEPENDENT_AMBULATORY_CARE_PROVIDER_SITE_OTHER)
Admission: RE | Admit: 2011-03-25 | Discharge: 2011-03-25 | Disposition: A | Discharge status: Home / Self Care | Payer: Medicare Other | Source: Ambulatory Visit | Attending: Family Medicine | Admitting: Family Medicine

## 2011-03-25 DIAGNOSIS — R51 Headache: Secondary | ICD-10-CM

## 2011-04-09 LAB — POCT PREGNANCY, URINE
Operator id: 268271
Preg Test, Ur: NEGATIVE

## 2011-04-09 LAB — POCT HEMOGLOBIN-HEMACUE
Hemoglobin: 13
Operator id: 268271

## 2011-04-12 LAB — POCT URINALYSIS DIP (DEVICE)
Bilirubin Urine: NEGATIVE
Glucose, UA: NEGATIVE
Ketones, ur: 15 — AB
Nitrite: POSITIVE — AB
Operator id: 247071
Protein, ur: >=300 — AB
Specific Gravity, Urine: >=1.03
Urobilinogen, UA: 0.2
pH: 5.5

## 2011-04-12 LAB — URINE CULTURE: Colony Count: >=100000

## 2011-04-12 LAB — GC/CHLAMYDIA PROBE AMP, GENITAL
Chlamydia, DNA Probe: NEGATIVE
GC Probe Amp, Genital: NEGATIVE

## 2011-04-12 LAB — WET PREP, GENITAL
Clue Cells Wet Prep HPF POC: NONE SEEN
Trich, Wet Prep: NONE SEEN
WBC, Wet Prep HPF POC: NONE SEEN
Yeast Wet Prep HPF POC: NONE SEEN

## 2011-04-12 LAB — POCT PREGNANCY, URINE: Preg Test, Ur: NEGATIVE

## 2011-04-13 LAB — POCT PREGNANCY, URINE: Preg Test, Ur: NEGATIVE

## 2011-04-13 LAB — POCT I-STAT, CHEM 8
BUN: 8
Calcium, Ion: 1.16
Chloride: 103
Creatinine, Ser: 1
Glucose, Bld: 92
HCT: 41
Hemoglobin: 13.9
Potassium: 3.5
Sodium: 139
TCO2: 27

## 2011-04-13 LAB — URINE CULTURE: Colony Count: >=100000

## 2011-04-13 LAB — POCT HEMOGLOBIN-HEMACUE: Hemoglobin: 14.5

## 2011-04-13 LAB — URINALYSIS, ROUTINE W REFLEX MICROSCOPIC
Bilirubin Urine: NEGATIVE
Glucose, UA: NEGATIVE
Ketones, ur: NEGATIVE
Nitrite: NEGATIVE
Protein, ur: NEGATIVE
Specific Gravity, Urine: 1.028
Urobilinogen, UA: 0.2
pH: 5

## 2011-04-13 LAB — URINE MICROSCOPIC-ADD ON

## 2011-04-14 LAB — DIFFERENTIAL
Basophils Absolute: 0.1
Basophils Relative: 1
Eosinophils Absolute: 0.2
Eosinophils Relative: 3
Lymphocytes Relative: 32
Lymphs Abs: 1.7
Monocytes Absolute: 0.5
Monocytes Relative: 9
Neutro Abs: 2.9
Neutrophils Relative %: 55

## 2011-04-14 LAB — POCT I-STAT, CHEM 8
BUN: 3 — ABNORMAL LOW
Calcium, Ion: 1.07 — ABNORMAL LOW
Chloride: 107
Creatinine, Ser: 0.9
Glucose, Bld: 89
HCT: 40
Hemoglobin: 13.6
Potassium: 5.7 — ABNORMAL HIGH
Sodium: 138
TCO2: 26

## 2011-04-14 LAB — URINALYSIS, ROUTINE W REFLEX MICROSCOPIC
Bilirubin Urine: NEGATIVE
Glucose, UA: NEGATIVE
Hgb urine dipstick: NEGATIVE
Ketones, ur: NEGATIVE
Nitrite: NEGATIVE
Protein, ur: NEGATIVE
Specific Gravity, Urine: 1.01
Urobilinogen, UA: 0.2
pH: 7

## 2011-04-14 LAB — CBC
HCT: 40.5
Hemoglobin: 13.6
MCHC: 33.5
MCV: 89.7
Platelets: 257
RBC: 4.51
RDW: 12.1
WBC: 5.4

## 2011-04-14 LAB — POCT PREGNANCY, URINE: Preg Test, Ur: NEGATIVE

## 2011-04-14 LAB — POTASSIUM: Potassium: 3.7

## 2011-04-16 LAB — DIFFERENTIAL
Basophils Absolute: 0
Basophils Relative: 1
Eosinophils Absolute: 0.3
Eosinophils Relative: 6 — ABNORMAL HIGH
Lymphocytes Relative: 31
Lymphs Abs: 1.9
Monocytes Absolute: 0.4
Monocytes Relative: 7
Neutro Abs: 3.4
Neutrophils Relative %: 56

## 2011-04-16 LAB — I-STAT 8, (EC8 V) (CONVERTED LAB)
BUN: 9
Bicarbonate: 27.2 — ABNORMAL HIGH
Chloride: 107
Glucose, Bld: 87
HCT: 46
Hemoglobin: 15.6 — ABNORMAL HIGH
Operator id: 257131
Potassium: 4.1
Sodium: 139
TCO2: 29
pCO2, Ven: 51.6 — ABNORMAL HIGH
pH, Ven: 7.331 — ABNORMAL HIGH

## 2011-04-16 LAB — CBC
HCT: 41
Hemoglobin: 13.8
MCHC: 33.7
MCV: 91.7
Platelets: 248
RBC: 4.48
RDW: 13.2
WBC: 6

## 2011-04-16 LAB — RAPID URINE DRUG SCREEN, HOSP PERFORMED
Amphetamines: NOT DETECTED
Barbiturates: NOT DETECTED
Benzodiazepines: POSITIVE — AB
Cocaine: NOT DETECTED
Opiates: NOT DETECTED
Tetrahydrocannabinol: NOT DETECTED

## 2011-04-16 LAB — HEPATIC FUNCTION PANEL
ALT: 19
AST: 20
Albumin: 3.7
Alkaline Phosphatase: 60
Bilirubin, Direct: 0.1
Indirect Bilirubin: 0.6
Total Bilirubin: 0.7
Total Protein: 6.4

## 2011-04-16 LAB — POCT I-STAT CREATININE
Creatinine, Ser: 0.8
Operator id: 257131

## 2011-04-16 LAB — TSH: TSH: 0.906

## 2011-04-16 LAB — ETHANOL: Alcohol, Ethyl (B): <5

## 2011-04-23 LAB — POCT HEMOGLOBIN-HEMACUE
Hemoglobin: 13.7
Operator id: 118191

## 2011-04-23 LAB — POCT PREGNANCY, URINE
Operator id: 118191
Preg Test, Ur: NEGATIVE

## 2011-04-26 LAB — POCT URINALYSIS DIP (DEVICE)
Glucose, UA: NEGATIVE
Nitrite: NEGATIVE
Operator id: 235561
Protein, ur: NEGATIVE
Specific Gravity, Urine: >=1.03
Urobilinogen, UA: 0.2
pH: 5.5

## 2011-04-26 LAB — POCT PREGNANCY, URINE
Operator id: 235561
Preg Test, Ur: NEGATIVE

## 2011-04-26 LAB — WET PREP, GENITAL
Clue Cells Wet Prep HPF POC: NONE SEEN
Trich, Wet Prep: NONE SEEN
Yeast Wet Prep HPF POC: NONE SEEN

## 2011-04-26 LAB — GC/CHLAMYDIA PROBE AMP, GENITAL
Chlamydia, DNA Probe: NEGATIVE
GC Probe Amp, Genital: NEGATIVE

## 2011-04-27 LAB — CBC
HCT: 38.4
Hemoglobin: 13
MCHC: 34
MCV: 89.7
Platelets: 211
RBC: 4.28
RDW: 12.7
WBC: 3.7 — ABNORMAL LOW

## 2011-04-27 LAB — DIFFERENTIAL
Basophils Absolute: 0
Basophils Relative: 1
Eosinophils Absolute: 0.4
Eosinophils Relative: 10 — ABNORMAL HIGH
Lymphocytes Relative: 43
Lymphs Abs: 1.6
Monocytes Absolute: 0.3
Monocytes Relative: 7
Neutro Abs: 1.4 — ABNORMAL LOW
Neutrophils Relative %: 39 — ABNORMAL LOW

## 2011-04-27 LAB — BASIC METABOLIC PANEL
BUN: 8
CO2: 26
Calcium: 9.2
Chloride: 105
Creatinine, Ser: 0.53
GFR calc Af Amer: >60
GFR calc non Af Amer: >60
Glucose, Bld: 98
Potassium: 3.9
Sodium: 136

## 2011-04-27 LAB — SEDIMENTATION RATE: Sed Rate: 8

## 2011-04-27 LAB — HEPATIC FUNCTION PANEL
ALT: 233 — ABNORMAL HIGH
AST: 65 — ABNORMAL HIGH
Albumin: 3.7
Alkaline Phosphatase: 95
Bilirubin, Direct: <0.1
Total Bilirubin: 0.4
Total Protein: 6.3

## 2011-04-27 LAB — CK: Total CK: 121

## 2011-04-28 LAB — URINALYSIS, ROUTINE W REFLEX MICROSCOPIC
Bilirubin Urine: NEGATIVE
Glucose, UA: NEGATIVE
Hgb urine dipstick: NEGATIVE
Ketones, ur: NEGATIVE
Nitrite: NEGATIVE
Protein, ur: NEGATIVE
Specific Gravity, Urine: 1.026
Urobilinogen, UA: 0.2
pH: 6

## 2011-04-28 LAB — POCT PREGNANCY, URINE
Operator id: 19830
Preg Test, Ur: NEGATIVE

## 2011-07-21 ENCOUNTER — Encounter (HOSPITAL_COMMUNITY): Payer: Self-pay | Admitting: *Deleted

## 2011-07-21 ENCOUNTER — Emergency Department (HOSPITAL_COMMUNITY)
Admission: EM | Admit: 2011-07-21 | Discharge: 2011-07-21 | Disposition: A | Discharge status: Home / Self Care | Payer: Medicare Other | Attending: Emergency Medicine | Admitting: Emergency Medicine

## 2011-07-21 ENCOUNTER — Emergency Department (HOSPITAL_COMMUNITY): Payer: Medicare Other

## 2011-07-21 DIAGNOSIS — M549 Dorsalgia, unspecified: Secondary | ICD-10-CM | POA: Insufficient documentation

## 2011-07-21 DIAGNOSIS — R209 Unspecified disturbances of skin sensation: Secondary | ICD-10-CM | POA: Insufficient documentation

## 2011-07-21 DIAGNOSIS — R109 Unspecified abdominal pain: Secondary | ICD-10-CM | POA: Insufficient documentation

## 2011-07-21 DIAGNOSIS — Z79899 Other long term (current) drug therapy: Secondary | ICD-10-CM | POA: Insufficient documentation

## 2011-07-21 DIAGNOSIS — G8929 Other chronic pain: Secondary | ICD-10-CM

## 2011-07-21 DIAGNOSIS — R300 Dysuria: Secondary | ICD-10-CM | POA: Insufficient documentation

## 2011-07-21 DIAGNOSIS — M545 Low back pain, unspecified: Secondary | ICD-10-CM | POA: Insufficient documentation

## 2011-07-21 DIAGNOSIS — Z9889 Other specified postprocedural states: Secondary | ICD-10-CM | POA: Insufficient documentation

## 2011-07-21 HISTORY — DX: Bipolar disorder, unspecified: F31.9

## 2011-07-21 MED ORDER — MORPHINE SULFATE 4 MG/ML IJ SOLN
4.0000 mg | Freq: Once | INTRAMUSCULAR | Status: AC
  Administered 2011-07-21: 4 mg via INTRAMUSCULAR
  Filled 2011-07-21: qty 1

## 2011-07-21 NOTE — ED Provider Notes (Signed)
History     CSN: [id number]  Arrival date & time 07/21/11  1342   First MD Initiated Contact with Patient 07/21/11 1654     5:04 PM HPI Pt state she has a h/o of chronic back pain. States she fell 2 months ago, and pain has gradually worsened since. Reports chronic numbness and weakness. The patient's the body. However denies saddle anesthesias, perineal numbness, incontinence. Reports a history of interstitial cystitis. This is unchanged but is currently having abdominal pain, urinary urgency and dysuria. States she has a surgery schedule with Dr. Lamar Sprinkles, her neurologist appear. Patient is a 31 y.o. female presenting with back pain. The history is provided by the patient.  Back Pain  Episode onset: 2 months ago. The problem occurs constantly. The problem has been gradually worsening. The pain is associated with falling. The pain is present in the lumbar spine. The quality of the pain is described as shooting and stabbing. The pain is severe. The symptoms are aggravated by bending, twisting and certain positions. Associated symptoms include numbness, abdominal pain ( Chronic), dysuria (Chronic), leg pain, paresthesias, tingling and weakness. Pertinent negatives include no chest pain, no fever, no headaches, no bowel incontinence, no perianal numbness, no bladder incontinence, no pelvic pain and no paresis. The treatment provided mild relief.    Past Medical History  Diagnosis Date  . Bipolar 1 disorder   . Migraine     Past Surgical History  Procedure Date  . Back surgery   . Colon surgery   . Tubal ligation     No family history on file.  History  Substance Use Topics  . Smoking status: Never Smoker   . Smokeless tobacco: Never Used  . Alcohol Use: No    OB History    Grav Para Term Preterm Abortions TAB SAB Ect Mult Living                  Review of Systems  Constitutional: Negative for fever and chills.  HENT: Negative for neck pain and neck stiffness.     Cardiovascular: Negative for chest pain.  Gastrointestinal: Positive for abdominal pain ( Chronic). Negative for bowel incontinence.  Genitourinary: Positive for dysuria (Chronic), urgency (Chronic) and frequency (Chronic). Negative for bladder incontinence, hematuria, flank pain and pelvic pain.  Musculoskeletal: Positive for back pain.       Denies saddle anesthesias, or perineal numbness, urinary or bowel incontinence  Neurological: Positive for tingling, weakness, numbness and paresthesias. Negative for headaches.  All other systems reviewed and are negative.    Allergies  Ciprofloxacin; Erythromycin; Gabapentin; Prozac; and Uritact ds  Home Medications   Current Outpatient Rx  Name Route Sig Dispense Refill  . DOXAZOSIN MESYLATE 4 MG PO TABS Oral Take 4 mg by mouth at bedtime.    Marland Kitchen DOXYCYCLINE HYCLATE 100 MG PO CAPS Oral Take 100 mg by mouth 2 (two) times daily. PT STARTED ON 07-08-11 FOR 14 DAYS    . IBUPROFEN 600 MG PO TABS Oral Take 600 mg by mouth 3 (three) times daily as needed.    . ILOPERIDONE 6 MG PO TABS Oral Take 1 tablet by mouth 2 (two) times daily.    Marland Kitchen LAMOTRIGINE 100 MG PO TABS Oral Take 200 mg by mouth 2 (two) times daily.    Marland Kitchen LORAZEPAM 2 MG PO TABS Oral Take 2 mg by mouth 3 (three) times daily.    . OXYCODONE-ACETAMINOPHEN 5-325 MG PO TABS Oral Take 2 tablets by mouth every 4 (  four) hours as needed. PAIN    . PROMETHAZINE HCL 25 MG PO TABS Oral Take 25 mg by mouth every 6 (six) hours as needed.    . TOPIRAMATE 100 MG PO TABS Oral Take 100-300 mg by mouth 2 (two) times daily. PT TAKES 100 MG IN THE MORNING. 200 MG AT NIGHT    . TRAZODONE HCL 150 MG PO TABS Oral Take 150 mg by mouth at bedtime.    . SUMATRIPTAN SUCCINATE 100 MG PO TABS Oral Take 100 mg by mouth every 2 (two) hours as needed. MIGRAINE      BP 124/67  Pulse 76  Temp(Src) 99.1 F (37.3 C) (Oral)  Resp 18  SpO2 99%  LMP 06/20/2011  Physical Exam  Vitals reviewed. Constitutional: She is  oriented to person, place, and time. Vital signs are normal. She appears well-developed and well-nourished.  HENT:  Head: Normocephalic and atraumatic.  Eyes: Conjunctivae are normal. Pupils are equal, round, and reactive to light.  Neck: Normal range of motion. Neck supple.  Cardiovascular: Normal rate, regular rhythm and normal heart sounds.  Exam reveals no friction rub.   No murmur heard. Pulmonary/Chest: Effort normal and breath sounds normal. She has no wheezes. She has no rhonchi. She has no rales. She exhibits no tenderness.  Abdominal: Soft. Bowel sounds are normal. She exhibits no distension and no mass. There is no tenderness. There is no rebound and no guarding.  Musculoskeletal: Normal range of motion.       Lumbar back: She exhibits tenderness and bony tenderness.       Back:  Neurological: She is alert and oriented to person, place, and time. Coordination normal.  Skin: Skin is warm and dry. No rash noted. No erythema. No pallor.    ED Course  Procedures   MDM  Patient has chronic pain. Reports pain has been ongoing persistently worsening for the last 2 months. Patient has been several times in the ED in the past. Has not returned recently. Reports she is to have TENS replacement surgery in February. States she has not told her doctors that she fell. Reports abdominal pain numbness and weakness is chronic for her. Ambulates without difficulty and low suspicion of acute spinal injury.       Thomasene Lot, PA-C 07/21/11 1807

## 2011-07-21 NOTE — ED Notes (Signed)
Pt states she fell at home about 2months ago. Pt states she does not remember the fall. Pt states she is scheduled for surgery. Pt states she need pain medication

## 2011-07-22 NOTE — ED Provider Notes (Signed)
Medical screening examination/treatment/procedure(s) were performed by non-physician practitioner and as supervising physician I was immediately available for consultation/collaboration.   Dione Booze, MD 07/22/11 [phone]

## 2011-11-09 ENCOUNTER — Ambulatory Visit (INDEPENDENT_AMBULATORY_CARE_PROVIDER_SITE_OTHER): Payer: Medicare Other | Admitting: Obstetrics and Gynecology

## 2011-11-09 ENCOUNTER — Encounter: Payer: Self-pay | Admitting: Obstetrics and Gynecology

## 2011-11-09 VITALS — BP 100/64 | HR 68 | Resp 16 | Ht 62.0 in | Wt 132.0 lb

## 2011-11-09 DIAGNOSIS — B49 Unspecified mycosis: Secondary | ICD-10-CM

## 2011-11-09 DIAGNOSIS — B379 Candidiasis, unspecified: Secondary | ICD-10-CM

## 2011-11-09 DIAGNOSIS — Z202 Contact with and (suspected) exposure to infections with a predominantly sexual mode of transmission: Secondary | ICD-10-CM

## 2011-11-09 DIAGNOSIS — F070 Personality change due to known physiological condition: Secondary | ICD-10-CM

## 2011-11-09 DIAGNOSIS — Z975 Presence of (intrauterine) contraceptive device: Secondary | ICD-10-CM | POA: Insufficient documentation

## 2011-11-09 DIAGNOSIS — Z9189 Other specified personal risk factors, not elsewhere classified: Secondary | ICD-10-CM

## 2011-11-09 DIAGNOSIS — Z139 Encounter for screening, unspecified: Secondary | ICD-10-CM

## 2011-11-09 LAB — POCT WET PREP (WET MOUNT)
Clue Cells Wet Prep Whiff POC: NEGATIVE
pH: 4.5

## 2011-11-09 MED ORDER — FLUCONAZOLE 150 MG PO TABS: 150.0000 mg | ORAL_TABLET | Freq: Once | ORAL | Status: AC

## 2011-11-09 NOTE — Progress Notes (Signed)
C/o recurrent yeast infection  Filed Vitals:   11/09/11 1058  BP: 100/64  Pulse: 68  Resp: 16   Results for orders placed in visit on 11/09/11  POCT WET PREP (WET [facility])      Component Value Range   Source Wet Prep POC vaginal     WBC, Wet Prep HPF POC       Bacteria Wet Prep HPF POC none     BACTERIA WET PREP MORPHOLOGY POC       Clue Cells Wet Prep HPF POC None     CLUE CELLS WET PREP WHIFF POC Negative Whiff     Yeast Wet Prep HPF POC Few     KOH Wet Prep POC       Trichomonas Wet Prep HPF POC none     pH 4.5      Pelvic exam: normal external genitalia, vulva, vagina, cervix, uterus and adnexa. IUD string visible  A/P Mild Yeast infection - Diflucan RTO next available for AEX STD w/u with consent

## 2011-11-10 LAB — RPR

## 2011-11-10 LAB — HEPATITIS B SURFACE ANTIGEN: Hepatitis B Surface Ag: NEGATIVE

## 2011-11-10 LAB — HEPATITIS C ANTIBODY: HCV Ab: NEGATIVE

## 2011-11-10 LAB — GC/CHLAMYDIA PROBE AMP, GENITAL
Chlamydia, DNA Probe: NEGATIVE
GC Probe Amp, Genital: NEGATIVE

## 2011-11-10 LAB — HIV ANTIBODY (ROUTINE TESTING W REFLEX): HIV: NONREACTIVE

## 2011-11-10 LAB — HSV 2 ANTIBODY, IGG: HSV 2 Glycoprotein G Ab, IgG: <0.1 IV

## 2011-11-10 LAB — HSV 1 ANTIBODY, IGG: HSV 1 Glycoprotein G Ab, IgG: <0.1 IV

## 2011-12-16 ENCOUNTER — Ambulatory Visit: Payer: Medicare Other | Admitting: Obstetrics and Gynecology

## 2012-02-21 ENCOUNTER — Emergency Department (HOSPITAL_COMMUNITY)
Admission: EM | Admit: 2012-02-21 | Discharge: 2012-02-21 | Discharge status: Absent without leave | Payer: Medicare Other | Source: Home / Self Care | Attending: Family Medicine | Admitting: Family Medicine

## 2012-02-27 ENCOUNTER — Encounter (HOSPITAL_COMMUNITY): Payer: Self-pay | Admitting: *Deleted

## 2012-02-27 ENCOUNTER — Emergency Department (INDEPENDENT_AMBULATORY_CARE_PROVIDER_SITE_OTHER)
Admission: EM | Admit: 2012-02-27 | Discharge: 2012-02-27 | Disposition: A | Discharge status: Home / Self Care | Payer: Medicare Other | Source: Home / Self Care

## 2012-02-27 DIAGNOSIS — Z30431 Encounter for routine checking of intrauterine contraceptive device: Secondary | ICD-10-CM

## 2012-02-27 DIAGNOSIS — N912 Amenorrhea, unspecified: Secondary | ICD-10-CM

## 2012-02-27 HISTORY — DX: Dorsalgia, unspecified: M54.9

## 2012-02-27 HISTORY — DX: Other chronic pain: G89.29

## 2012-02-27 LAB — POCT URINALYSIS DIP (DEVICE)
Bilirubin Urine: NEGATIVE
Glucose, UA: NEGATIVE mg/dL
Ketones, ur: NEGATIVE mg/dL
Leukocytes, UA: NEGATIVE
Nitrite: NEGATIVE
Protein, ur: 30 mg/dL — AB
Specific Gravity, Urine: 1.015 (ref 1.005–1.030)
Urobilinogen, UA: 0.2 mg/dL (ref 0.0–1.0)
pH: 8.5 — ABNORMAL HIGH (ref 5.0–8.0)

## 2012-02-27 LAB — POCT PREGNANCY, URINE: Preg Test, Ur: NEGATIVE

## 2012-02-27 NOTE — ED Provider Notes (Signed)
History     CSN: [id number]  Arrival date & time 02/27/12  1114   None     Chief Complaint  Patient presents with  . Late Period  . Back Pain    (Consider location/radiation/quality/duration/timing/severity/associated sxs/prior treatment) Patient is a 31 y.o. female presenting with back pain. The history is provided by the patient.  Back Pain  Pertinent negatives include no abdominal pain, no dysuria and no pelvic pain.  female expresses concern that her menstrual cycle is late.  Patient's last menstrual period was 01/24/2012. Mirena IUD inserted 1.5 years ago, menses has been monthly since then, reports no string checks in over a year.  Hx of tubal ligation, last unprotected intercourse greater than one month ago.  States she has had lower abdominal cramping and similar symptoms of her menses beginning but has not began bleeding.  No fever, nausea, fatigue, dyspareunia, vaginal discharge, vomiting, diarrhea, or unusual UTI symptoms.  Known history of interstitial cystitis.  Denies history of known exposure to STD or symptoms in partner.   Past Medical History  Diagnosis Date  . Bipolar 1 disorder   . Migraine   . Mental disorder   . Anxiety   . Interstitial cystitis   . Galactorrhea   . Chronic back pain     Past Surgical History  Procedure Date  . Tubal ligation   . Back surgery   . Colon surgery   . Interstim implant placement     Family History  Problem Relation Age of Onset  . Hypertension Mother   . Esophageal cancer Maternal Grandmother   . Diabetes Maternal Grandfather   . Stroke Maternal Grandfather   . Depression Maternal Grandfather   . Anxiety disorder Maternal Grandfather   . Heart disease Other     History  Substance Use Topics  . Smoking status: Never Smoker   . Smokeless tobacco: Never Used  . Alcohol Use: No    OB History    Grav Para Term Preterm Abortions TAB SAB Ect Mult Living   0               Review of Systems  Constitutional:  Negative.   Respiratory: Negative.   Cardiovascular: Negative.   Gastrointestinal: Positive for constipation. Negative for nausea, vomiting, abdominal pain and diarrhea.  Genitourinary: Positive for menstrual problem. Negative for dysuria, hematuria, flank pain, vaginal bleeding, vaginal discharge, difficulty urinating and pelvic pain.  Musculoskeletal: Positive for back pain.    Allergies  Augmentin; Ciprofloxacin; Erythromycin; Gabapentin; Meth-m blue-ba-ph [id number]-hyos; and Prozac  Home Medications   Current Outpatient Rx  Name Route Sig Dispense Refill  . DOXAZOSIN MESYLATE 4 MG PO TABS Oral Take 4 mg by mouth at bedtime.    . OXYBUTYNIN CHLORIDE ER 15 MG PO TB24 Oral Take 15 mg by mouth daily.    . OXYCODONE-ACETAMINOPHEN 5-325 MG PO TABS Oral Take 2 tablets by mouth every 4 (four) hours as needed. PAIN    . SUMATRIPTAN SUCCINATE 100 MG PO TABS Oral Take 100 mg by mouth every 2 (two) hours as needed. MIGRAINE    . TOPIRAMATE 100 MG PO TABS Oral Take 100-300 mg by mouth 2 (two) times daily. PT TAKES 100 MG IN THE MORNING. 200 MG AT NIGHT    . ZIPRASIDONE HCL 60 MG PO CAPS Oral Take 60 mg by mouth 2 (two) times daily with a meal.    . DOXYCYCLINE HYCLATE 100 MG PO CAPS Oral Take 100 mg by mouth 2 (two) times  daily. PT STARTED ON 07-08-11 FOR 14 DAYS    . IBUPROFEN 600 MG PO TABS Oral Take 600 mg by mouth 3 (three) times daily as needed.    . ILOPERIDONE 6 MG PO TABS Oral Take 1 tablet by mouth 2 (two) times daily.    Marland Kitchen LAMOTRIGINE 100 MG PO TABS Oral Take 200 mg by mouth 2 (two) times daily.    Marland Kitchen LORAZEPAM 2 MG PO TABS Oral Take 2 mg by mouth 3 (three) times daily.    . ORPHENADRINE CITRATE ER 100 MG PO TB12 Oral Take 100 mg by mouth 2 (two) times daily as needed.    Marland Kitchen PROMETHAZINE HCL 25 MG PO TABS Oral Take 25 mg by mouth every 6 (six) hours as needed.    . TRAZODONE HCL 150 MG PO TABS Oral Take 150 mg by mouth at bedtime.      BP 103/68  Pulse 81  Temp 99.7 F (37.6 C) (Oral)   Resp 18  SpO2 99%  LMP 01/24/2012  Physical Exam  Nursing note and vitals reviewed. Constitutional: She is oriented to person, place, and time. Vital signs are normal. She appears well-developed and well-nourished. She is active and cooperative.  HENT:  Head: Normocephalic.  Eyes: Conjunctivae are normal. Pupils are equal, round, and reactive to light. No scleral icterus.  Neck: Trachea normal. Neck supple.  Cardiovascular: Normal rate and regular rhythm.   Pulmonary/Chest: Effort normal and breath sounds normal.  Abdominal: Soft. Bowel sounds are normal. There is no tenderness.  Genitourinary: Uterus normal. Pelvic exam was performed with patient supine. There is no rash, tenderness or lesion on the right labia. There is no rash, tenderness or lesion on the left labia. Cervix exhibits no motion tenderness, no discharge and no friability. Right adnexum displays no mass, no tenderness and no fullness. Left adnexum displays no mass, no tenderness and no fullness. No erythema, tenderness or bleeding around the vagina. No foreign body around the vagina. No signs of injury around the vagina. No vaginal discharge found.         IUD strings visualized  Lymphadenopathy:       Right: No inguinal adenopathy present.       Left: No inguinal adenopathy present.  Neurological: She is alert and oriented to person, place, and time. No cranial nerve deficit or sensory deficit.  Skin: Skin is warm and dry.  Psychiatric: She has a normal mood and affect. Her speech is normal and behavior is normal. Judgment and thought content normal. Cognition and memory are normal.    ED Course  Procedures (including critical care time)  Labs Reviewed  POCT URINALYSIS DIP (DEVICE) - Abnormal; Notable for the following:    Hgb urine dipstick SMALL (*)     pH 8.5 (*)     Protein, ur 30 (*)     All other components within normal limits  POCT PREGNANCY, URINE   No results found.   1. Amenorrhea   2. Family  planning, IUD (intrauterine device) check/reinsertion/removal       MDM  Negative urine pregnancy test.  Follow up with your ob/gyn Dr. Su Hilt for further concerns, it is not abnormal to have irregular or absence of menses with Mirena.  Follow up with your pain clinic for your chronic back pain.          Johnsie Kindred, NP 02/27/12 1303

## 2012-02-27 NOTE — ED Provider Notes (Signed)
Medical screening examination/treatment/procedure(s) were performed by non-physician practitioner and as supervising physician I was immediately available for consultation/collaboration.  Raynald Blend, MD 02/27/12 1413

## 2012-02-27 NOTE — ED Notes (Signed)
Patient concerned she hasn't started her period yet since 7/15.  Has had IUD in place x 1.5 yrs, was told by GYN that after a year she may not have periods and was explained that they may be irregular, but patient concerned because "I always have a period".  Denies any abd pain.  C/O generalized low and mid back pain.  Patient has not been sexually active "for months".

## 2012-02-29 ENCOUNTER — Encounter (HOSPITAL_COMMUNITY): Payer: Self-pay | Admitting: *Deleted

## 2012-02-29 ENCOUNTER — Emergency Department (HOSPITAL_COMMUNITY): Payer: Medicare Other

## 2012-02-29 ENCOUNTER — Emergency Department (HOSPITAL_COMMUNITY)
Admission: EM | Admit: 2012-02-29 | Discharge: 2012-02-29 | Disposition: A | Discharge status: Home / Self Care | Payer: Medicare Other | Attending: Emergency Medicine | Admitting: Emergency Medicine

## 2012-02-29 DIAGNOSIS — R05 Cough: Secondary | ICD-10-CM | POA: Insufficient documentation

## 2012-02-29 DIAGNOSIS — R51 Headache: Secondary | ICD-10-CM | POA: Insufficient documentation

## 2012-02-29 DIAGNOSIS — R079 Chest pain, unspecified: Secondary | ICD-10-CM | POA: Insufficient documentation

## 2012-02-29 DIAGNOSIS — R059 Cough, unspecified: Secondary | ICD-10-CM | POA: Insufficient documentation

## 2012-02-29 MED ORDER — FEXOFENADINE-PSEUDOEPHED ER 180-240 MG PO TB24: 1.0000 | ORAL_TABLET | Freq: Every day | ORAL | Status: DC

## 2012-02-29 MED ORDER — KETOROLAC TROMETHAMINE 30 MG/ML IJ SOLN
30.0000 mg | Freq: Once | INTRAMUSCULAR | Status: AC
  Administered 2012-02-29: 30 mg via INTRAVENOUS

## 2012-02-29 MED ORDER — DIPHENHYDRAMINE HCL 50 MG/ML IJ SOLN
25.0000 mg | Freq: Once | INTRAMUSCULAR | Status: AC
  Administered 2012-02-29: 25 mg via INTRAVENOUS
  Filled 2012-02-29: qty 1

## 2012-02-29 MED ORDER — DROPERIDOL 2.5 MG/ML IJ SOLN: 1.2500 mg | Freq: Once | INTRAMUSCULAR | Status: DC

## 2012-02-29 MED ORDER — METOCLOPRAMIDE HCL 5 MG/ML IJ SOLN
10.0000 mg | Freq: Once | INTRAMUSCULAR | Status: AC
  Administered 2012-02-29: 10 mg via INTRAVENOUS
  Filled 2012-02-29: qty 2

## 2012-02-29 MED ORDER — KETOROLAC TROMETHAMINE 30 MG/ML IJ SOLN
INTRAMUSCULAR | Status: AC
  Administered 2012-02-29: 30 mg via INTRAVENOUS
  Filled 2012-02-29: qty 1

## 2012-02-29 MED ORDER — HYDROMORPHONE HCL PF 1 MG/ML IJ SOLN
0.5000 mg | Freq: Once | INTRAMUSCULAR | Status: AC
  Administered 2012-02-29: 0.5 mg via INTRAVENOUS

## 2012-02-29 MED ORDER — HYDROMORPHONE HCL PF 1 MG/ML IJ SOLN
0.5000 mg | Freq: Once | INTRAMUSCULAR | Status: DC
  Filled 2012-02-29: qty 1

## 2012-02-29 MED ORDER — KETOROLAC TROMETHAMINE 15 MG/ML IJ SOLN: 15.0000 mg | Freq: Once | INTRAMUSCULAR | Status: DC

## 2012-02-29 MED ORDER — DEXAMETHASONE SODIUM PHOSPHATE 10 MG/ML IJ SOLN
10.0000 mg | Freq: Once | INTRAMUSCULAR | Status: AC
  Administered 2012-02-29: 10 mg via INTRAVENOUS
  Filled 2012-02-29: qty 1

## 2012-02-29 MED ORDER — SODIUM CHLORIDE 0.9 % IV BOLUS (SEPSIS)
1000.0000 mL | Freq: Once | INTRAVENOUS | Status: AC
  Administered 2012-02-29: 1000 mL via INTRAVENOUS

## 2012-02-29 MED ORDER — SUMATRIPTAN SUCCINATE 50 MG PO TABS: 50.0000 mg | ORAL_TABLET | ORAL | Status: DC | PRN

## 2012-02-29 NOTE — ED Notes (Addendum)
Pt c/o migraine and cold like sx for the past 2-3 days.  Migraine is photophobic and in the frontal area.  Non-productive cough.

## 2012-03-05 NOTE — ED Provider Notes (Signed)
History    30yF with HA. Gradual onset 2d ago. Persistent since. Diffuse but worse in frontal region. Mild photophobia. Reports hx of what she calls migraines. Current HA similar to previous. NO trauma. No Fever or chills. No nv. No numbness, tingling or loss of strength. No change in visual acuity.  CSN: [id number]  Arrival date & time 02/29/12  8657   First MD Initiated Contact with Patient 02/29/12 2056      Chief Complaint  Patient presents with  . Migraine  . URI    (Consider location/radiation/quality/duration/timing/severity/associated sxs/prior treatment) HPI  Past Medical History  Diagnosis Date  . Bipolar 1 disorder   . Migraine   . Mental disorder   . Anxiety   . Interstitial cystitis   . Galactorrhea   . Chronic back pain     Past Surgical History  Procedure Date  . Tubal ligation   . Back surgery   . Colon surgery   . Interstim implant placement     Family History  Problem Relation Age of Onset  . Hypertension Mother   . Esophageal cancer Maternal Grandmother   . Diabetes Maternal Grandfather   . Stroke Maternal Grandfather   . Depression Maternal Grandfather   . Anxiety disorder Maternal Grandfather   . Heart disease Other     History  Substance Use Topics  . Smoking status: Never Smoker   . Smokeless tobacco: Never Used  . Alcohol Use: No    OB History    Grav Para Term Preterm Abortions TAB SAB Ect Mult Living   0               Review of Systems   Review of symptoms negative unless otherwise noted in HPI.   Allergies  Augmentin; Ciprofloxacin; Erythromycin; Gabapentin; Other; and Prozac  Home Medications   Current Outpatient Rx  Name Route Sig Dispense Refill  . ASENAPINE MALEATE 5 MG SL SUBL Sublingual Place 5 mg under the tongue at bedtime.    Marland Kitchen DOXAZOSIN MESYLATE 4 MG PO TABS Oral Take 4 mg by mouth at bedtime.    Marland Kitchen LAMOTRIGINE 100 MG PO TABS Oral Take 200 mg by mouth 2 (two) times daily.    Marland Kitchen LORAZEPAM 2 MG PO TABS Oral  Take 2 mg by mouth 3 (three) times daily.    . OXYCODONE-ACETAMINOPHEN 10-325 MG PO TABS Oral Take 1 tablet by mouth every 4 (four) hours as needed. For pain    . PROMETHAZINE HCL 25 MG PO TABS Oral Take 25 mg by mouth every 6 (six) hours as needed. For nausea    . SUMATRIPTAN SUCCINATE 100 MG PO TABS Oral Take 100 mg by mouth every 2 (two) hours as needed. MIGRAINE    . TOPIRAMATE 100 MG PO TABS Oral Take 100 mg by mouth 3 (three) times daily.     Marland Kitchen ZIPRASIDONE HCL 60 MG PO CAPS Oral Take 60 mg by mouth at bedtime.    Marland Kitchen ZOLPIDEM TARTRATE 10 MG PO TABS Oral Take 10-15 mg by mouth at bedtime as needed. For insomnia    . FEXOFENADINE-PSEUDOEPHED ER 180-240 MG PO TB24 Oral Take 1 tablet by mouth daily. 10 tablet 0  . SUMATRIPTAN SUCCINATE 50 MG PO TABS Oral Take 1 tablet (50 mg total) by mouth every 2 (two) hours as needed for migraine. 10 tablet 0    BP 128/59  Pulse 68  Temp 98.4 F (36.9 C) (Oral)  Resp 20  SpO2 100%  LMP 01/24/2012  Physical Exam  Nursing note and vitals reviewed. Constitutional: She is oriented to person, place, and time. She appears well-developed and well-nourished. No distress.  HENT:  Head: Normocephalic and atraumatic.  Eyes: Conjunctivae and EOM are normal. Pupils are equal, round, and reactive to light. Right eye exhibits no discharge. Left eye exhibits no discharge.  Neck: Normal range of motion. Neck supple.  Cardiovascular: Normal rate, regular rhythm and normal heart sounds.  Exam reveals no gallop and no friction rub.   No murmur heard. Pulmonary/Chest: Effort normal and breath sounds normal. No respiratory distress.  Abdominal: Soft. She exhibits no distension. There is no tenderness.  Musculoskeletal: She exhibits no edema and no tenderness.  Neurological: She is alert and oriented to person, place, and time. No cranial nerve deficit. She exhibits normal muscle tone. Coordination normal.       Good finger to nose b/l  Skin: Skin is warm and dry.    Psychiatric: She has a normal mood and affect. Her behavior is normal. Thought content normal.    ED Course  Procedures (including critical care time)  Labs Reviewed - No data to display No results found.   1. Headache       MDM  30yF with HA. Suspect primary HA. Consider emergent secondary causes such as bleed, infectious or mass but doubt. There is no history of trauma. Pt has a nonfocal neurological exam. Afebrile and neck supple. No use of blood thinning medication. Consider ocular etiology such as acute angle closure glaucoma but doubt. Pt denies acute change in visual acuity and eye exam unremarkable. Doubt temporal arteritis given age, no temporal tenderness and temporal artery pulsations palpable. Doubt CO poisoning. No contacts with similar symptoms. Doubt venous thrombosis. Doubt carotid or vertebral arteries dissection. Symptoms improved with meds. Feel that can be safely discharged, but strict return precautions discussed. Outpt fu.          Raeford Razor, MD 03/05/12 [id number]

## 2012-03-18 ENCOUNTER — Emergency Department (HOSPITAL_COMMUNITY)
Admission: EM | Admit: 2012-03-18 | Discharge: 2012-03-18 | Disposition: A | Discharge status: Home / Self Care | Payer: Medicare Other | Attending: Emergency Medicine | Admitting: Emergency Medicine

## 2012-03-18 ENCOUNTER — Encounter (HOSPITAL_COMMUNITY): Payer: Self-pay | Admitting: Physical Medicine and Rehabilitation

## 2012-03-18 DIAGNOSIS — F411 Generalized anxiety disorder: Secondary | ICD-10-CM | POA: Insufficient documentation

## 2012-03-18 DIAGNOSIS — Z823 Family history of stroke: Secondary | ICD-10-CM | POA: Insufficient documentation

## 2012-03-18 DIAGNOSIS — Z8249 Family history of ischemic heart disease and other diseases of the circulatory system: Secondary | ICD-10-CM | POA: Insufficient documentation

## 2012-03-18 DIAGNOSIS — Z888 Allergy status to other drugs, medicaments and biological substances status: Secondary | ICD-10-CM | POA: Insufficient documentation

## 2012-03-18 DIAGNOSIS — F319 Bipolar disorder, unspecified: Secondary | ICD-10-CM | POA: Insufficient documentation

## 2012-03-18 DIAGNOSIS — Z881 Allergy status to other antibiotic agents status: Secondary | ICD-10-CM | POA: Insufficient documentation

## 2012-03-18 DIAGNOSIS — Z833 Family history of diabetes mellitus: Secondary | ICD-10-CM | POA: Insufficient documentation

## 2012-03-18 DIAGNOSIS — Z818 Family history of other mental and behavioral disorders: Secondary | ICD-10-CM | POA: Insufficient documentation

## 2012-03-18 DIAGNOSIS — G8929 Other chronic pain: Secondary | ICD-10-CM | POA: Insufficient documentation

## 2012-03-18 DIAGNOSIS — G43909 Migraine, unspecified, not intractable, without status migrainosus: Secondary | ICD-10-CM

## 2012-03-18 DIAGNOSIS — M549 Dorsalgia, unspecified: Secondary | ICD-10-CM | POA: Insufficient documentation

## 2012-03-18 MED ORDER — METOCLOPRAMIDE HCL 5 MG/ML IJ SOLN
10.0000 mg | Freq: Once | INTRAMUSCULAR | Status: AC
  Administered 2012-03-18: 10 mg via INTRAVENOUS
  Filled 2012-03-18: qty 2

## 2012-03-18 MED ORDER — KETOROLAC TROMETHAMINE 30 MG/ML IJ SOLN
30.0000 mg | Freq: Once | INTRAMUSCULAR | Status: AC
  Administered 2012-03-18: 30 mg via INTRAVENOUS
  Filled 2012-03-18: qty 1

## 2012-03-18 MED ORDER — HYDROMORPHONE HCL PF 1 MG/ML IJ SOLN
0.5000 mg | Freq: Once | INTRAMUSCULAR | Status: AC
  Administered 2012-03-18: 0.5 mg via INTRAVENOUS
  Filled 2012-03-18 (×2): qty 1

## 2012-03-18 MED ORDER — DIPHENHYDRAMINE HCL 50 MG/ML IJ SOLN
25.0000 mg | Freq: Once | INTRAMUSCULAR | Status: AC
  Administered 2012-03-18: 25 mg via INTRAVENOUS
  Filled 2012-03-18: qty 1

## 2012-03-18 NOTE — ED Notes (Signed)
Pt presents to department for evaluation of headache. Ongoing x2 days. Pt states "throbbing" headache, nausea and photosensitivity. 10/10 pain at the time. Recently started taking Thorazine for migraines, no relief. She is alert and oriented x4. No neuro deficits noted.

## 2012-03-18 NOTE — ED Provider Notes (Signed)
History     CSN: [id number]  Arrival date & time 03/18/12  1513   First MD Initiated Contact with Patient 03/18/12 2049      Chief Complaint  Patient presents with  . Headache    (Consider location/radiation/quality/duration/timing/severity/associated sxs/prior treatment) HPI Comments: Pt with h/o migraines, has had a bad migraines, similar to priors for several days. Pt is on chronic oxycodone usually 3 per day for chronic back problems.  She reports several HA's ongoing for weeks, is followed by a HA specialist that and takes topamax, imitrex, but feels she has been overdoing it.  She had to come to the ED about 1 month ago for first time for HA, was given HA cocktail which didn't help, received 0.5 mg of dilaudid and that seemed to help.  She doesn't recall an immediate rebound HA, but has been told she has rebound by her HA specialist.  Photophobia, nausea.  No facial droop, focal numbness or weakness.  No rash, fever, stiff neck.    Patient is a 31 y.o. female presenting with headaches. The history is provided by the patient, medical records and a parent.  Headache  Associated symptoms include nausea. Pertinent negatives include no fever and no shortness of breath.    Past Medical History  Diagnosis Date  . Bipolar 1 disorder   . Migraine   . Mental disorder   . Anxiety   . Interstitial cystitis   . Galactorrhea   . Chronic back pain     Past Surgical History  Procedure Date  . Tubal ligation   . Back surgery   . Colon surgery   . Interstim implant placement     Family History  Problem Relation Age of Onset  . Hypertension Mother   . Esophageal cancer Maternal Grandmother   . Diabetes Maternal Grandfather   . Stroke Maternal Grandfather   . Depression Maternal Grandfather   . Anxiety disorder Maternal Grandfather   . Heart disease Other     History  Substance Use Topics  . Smoking status: Never Smoker   . Smokeless tobacco: Never Used  . Alcohol Use: No     OB History    Grav Para Term Preterm Abortions TAB SAB Ect Mult Living   0               Review of Systems  Constitutional: Negative for fever and chills.  HENT: Negative for congestion, rhinorrhea and sinus pressure.   Eyes: Positive for photophobia.  Respiratory: Negative for shortness of breath.   Cardiovascular: Negative for chest pain.  Gastrointestinal: Positive for nausea.  Skin: Negative for rash.  Neurological: Positive for headaches. Negative for seizures.  All other systems reviewed and are negative.    Allergies  Augmentin; Ciprofloxacin; Erythromycin; Gabapentin; Other; and Prozac  Home Medications   Current Outpatient Rx  Name Route Sig Dispense Refill  . MIDOL COMPLETE PO Oral Take 1 tablet by mouth 3 (three) times daily as needed. For menstrual cramping    . ASENAPINE MALEATE 5 MG SL SUBL Sublingual Place 5 mg under the tongue at bedtime.    Marland Kitchen DOXAZOSIN MESYLATE 4 MG PO TABS Oral Take 4 mg by mouth at bedtime.    Marland Kitchen FAMOTIDINE 20 MG PO TABS Oral Take 20 mg by mouth daily.    Marland Kitchen FEXOFENADINE-PSEUDOEPHED ER 180-240 MG PO TB24 Oral Take 1 tablet by mouth daily. 10 tablet 0  . LAMOTRIGINE 100 MG PO TABS Oral Take 200 mg by mouth 2 (  two) times daily.    Marland Kitchen LORAZEPAM 2 MG PO TABS Oral Take 2 mg by mouth 3 (three) times daily.    Marland Kitchen ONDANSETRON HCL 8 MG PO TABS Oral Take by mouth every 8 (eight) hours as needed. For nausea    . OXYCODONE-ACETAMINOPHEN 10-325 MG PO TABS Oral Take 1 tablet by mouth every 4 (four) hours as needed. For pain    . SUMATRIPTAN SUCCINATE 100 MG PO TABS Oral Take 100 mg by mouth every 2 (two) hours as needed. MIGRAINE    . SUMATRIPTAN SUCCINATE 50 MG PO TABS Oral Take 1 tablet (50 mg total) by mouth every 2 (two) hours as needed for migraine. 10 tablet 0  . TOPIRAMATE 100 MG PO TABS Oral Take 100 mg by mouth 4 (four) times daily.     Marland Kitchen ZIPRASIDONE HCL 60 MG PO CAPS Oral Take 60 mg by mouth at bedtime.      BP 119/85  Pulse 83  Temp 98.4  F (36.9 C) (Oral)  Resp 18  SpO2 100%  LMP 01/24/2012  Physical Exam  Nursing note and vitals reviewed. Constitutional: She is oriented to person, place, and time. She appears well-developed and well-nourished.  HENT:  Head: Normocephalic and atraumatic.  Eyes: EOM are normal. Pupils are equal, round, and reactive to light.  Neck: Normal range of motion. Neck supple.  Cardiovascular: Normal rate and regular rhythm.   Pulmonary/Chest: Effort normal. No respiratory distress. She has no wheezes.  Abdominal: Soft.  Neurological: She is alert and oriented to person, place, and time. She has normal strength. No cranial nerve deficit. Coordination normal. GCS eye subscore is 4. GCS verbal subscore is 5. GCS motor subscore is 6.  Skin: Skin is warm and dry.    ED Course  Procedures (including critical care time)  Labs Reviewed - No data to display No results found.   1. Migraine       MDM  I discussed at length with pt and family about dangers of using narcotics for migraines, and that this was likely cause of "rebound HA's" that her specialist was discussing with her.  She is insistent that HA cocktail did not help at all.   She is requesting 0.5 mg of dilaudid.  No emergent condition exists, will give usual cocktail and 0.5 mg of dilaudid and she can be discharged to home and I have encouraged she speak further with her HA specialist on Monday.          Gavin Pound. Oletta Lamas, MD 03/18/12 2121

## 2012-03-19 ENCOUNTER — Encounter (HOSPITAL_COMMUNITY): Payer: Self-pay | Admitting: Emergency Medicine

## 2012-03-19 ENCOUNTER — Emergency Department (INDEPENDENT_AMBULATORY_CARE_PROVIDER_SITE_OTHER)
Admission: EM | Admit: 2012-03-19 | Discharge: 2012-03-19 | Disposition: A | Discharge status: Home / Self Care | Payer: Medicare Other | Source: Home / Self Care | Attending: Emergency Medicine | Admitting: Emergency Medicine

## 2012-03-19 DIAGNOSIS — R519 Headache, unspecified: Secondary | ICD-10-CM

## 2012-03-19 DIAGNOSIS — R51 Headache: Secondary | ICD-10-CM

## 2012-03-19 DIAGNOSIS — G43909 Migraine, unspecified, not intractable, without status migrainosus: Secondary | ICD-10-CM

## 2012-03-19 HISTORY — DX: Other seasonal allergic rhinitis: J30.2

## 2012-03-19 HISTORY — DX: Chronic sinusitis, unspecified: J32.9

## 2012-03-19 MED ORDER — DIPHENHYDRAMINE HCL 25 MG PO CAPS
50.0000 mg | ORAL_CAPSULE | Freq: Once | ORAL | Status: AC
  Administered 2012-03-19: 50 mg via ORAL

## 2012-03-19 MED ORDER — FLUTICASONE PROPIONATE 50 MCG/ACT NA SUSP: 2.0000 | Freq: Every day | NASAL | Status: DC

## 2012-03-19 MED ORDER — KETOROLAC TROMETHAMINE 60 MG/2ML IM SOLN
INTRAMUSCULAR | Status: AC
  Filled 2012-03-19: qty 2

## 2012-03-19 MED ORDER — SUMATRIPTAN SUCCINATE 6 MG/0.5ML ~~LOC~~ SOLN
6.0000 mg | Freq: Once | SUBCUTANEOUS | Status: AC
  Administered 2012-03-19: 6 mg via SUBCUTANEOUS

## 2012-03-19 MED ORDER — GUAIFENESIN ER 600 MG PO TB12: 1200.0000 mg | ORAL_TABLET | Freq: Two times a day (BID) | ORAL | Status: DC

## 2012-03-19 MED ORDER — DEXAMETHASONE SODIUM PHOSPHATE 10 MG/ML IJ SOLN
10.0000 mg | Freq: Once | INTRAMUSCULAR | Status: AC
  Administered 2012-03-19: 10 mg via INTRAMUSCULAR

## 2012-03-19 MED ORDER — DIPHENHYDRAMINE HCL 25 MG PO CAPS
ORAL_CAPSULE | ORAL | Status: AC
  Filled 2012-03-19: qty 2

## 2012-03-19 MED ORDER — SUMATRIPTAN SUCCINATE 6 MG/0.5ML ~~LOC~~ SOLN
SUBCUTANEOUS | Status: AC
  Filled 2012-03-19: qty 0.5

## 2012-03-19 MED ORDER — KETOROLAC TROMETHAMINE 30 MG/ML IJ SOLN
60.0000 mg | Freq: Once | INTRAMUSCULAR | Status: AC
  Administered 2012-03-19: 60 mg via INTRAMUSCULAR

## 2012-03-19 MED ORDER — DEXAMETHASONE SODIUM PHOSPHATE 10 MG/ML IJ SOLN
INTRAMUSCULAR | Status: AC
  Filled 2012-03-19: qty 1

## 2012-03-19 NOTE — ED Provider Notes (Signed)
History     CSN: [id number]  Arrival date & time 03/19/12  1346   First MD Initiated Contact with Patient 03/19/12 1351      Chief Complaint  Patient presents with  . Migraine    (Consider location/radiation/quality/duration/timing/severity/associated sxs/prior treatment) HPI Comments: Pt with a history of severe migraines, seasonal allergies c/o gradual onset throbbing frontal daily HA starting 3 days ago, lasts hours. Better with rest and being in a dark room. Worse with light, noise, head movement,  especially with bending forward and with lying down. Reports nasal congestion, postnasal drip, nonproductive cough during this time, which is atypical for migraines. No N/V,visual changes, ear pain,  purulent nasal d/c, dental pain, neck stiffness, rash, dysarthria, focal weakness, facial droop. No mental status changes, seizures, syncope. Pt is not pregnant. No h/o HIV. No sudden onset, did not occur during exertion. States is similar to previous HA / migraines. Has tried Benadryl without relief. She is on several preventive migraine medications including Topamax, Lamictal. She had her Topamax increased 4 days ago. She was also started on Thorazine, but patient states she is not taking this, as it makes her headaches worse. Last dose of Imitrex 4 days ago. She was seen in the ED yesterday afternoon for a headache/migraine, was given headache cocktail-per patient Benadryl, Toradol, Reglan  and 0.5 mg of Dilaudid with significant improvement but that the headache returned later that night.    ROS as noted in HPI. All other ROS negative.     Patient is a 31 y.o. female presenting with migraine. The history is provided by the patient. No language interpreter was used.  Migraine This is a recurrent problem. The current episode started more than 2 days ago. The problem occurs daily. The problem has not changed since onset.Associated symptoms include headaches. The symptoms are relieved by  medications.    Past Medical History  Diagnosis Date  . Bipolar 1 disorder   . Migraine   . Mental disorder   . Anxiety   . Interstitial cystitis   . Galactorrhea   . Chronic back pain   . Seasonal allergies   . Sinusitis     Past Surgical History  Procedure Date  . Tubal ligation   . Colon surgery   . Interstim implant placement     Family History  Problem Relation Age of Onset  . Hypertension Mother   . Esophageal cancer Maternal Grandmother   . Diabetes Maternal Grandfather   . Stroke Maternal Grandfather   . Depression Maternal Grandfather   . Anxiety disorder Maternal Grandfather   . Heart disease Other     History  Substance Use Topics  . Smoking status: Never Smoker   . Smokeless tobacco: Never Used  . Alcohol Use: No    OB History    Grav Para Term Preterm Abortions TAB SAB Ect Mult Living   0               Review of Systems  Constitutional: Negative for fever.  HENT: Positive for congestion, rhinorrhea and postnasal drip. Negative for dental problem.   Eyes: Positive for photophobia.  Gastrointestinal: Positive for nausea. Negative for vomiting.  Skin: Negative for rash.  Neurological: Positive for headaches. Negative for weakness.    Allergies  Augmentin; Ciprofloxacin; Erythromycin; Gabapentin; Other; and Prozac  Home Medications   Current Outpatient Rx  Name Route Sig Dispense Refill  . FAMOTIDINE 20 MG PO TABS Oral Take 20 mg by mouth daily.    Marland Kitchen  OXYCODONE-ACETAMINOPHEN 10-325 MG PO TABS Oral Take 1 tablet by mouth every 4 (four) hours as needed. For pain    . TOPIRAMATE 100 MG PO TABS Oral Take 100 mg by mouth 4 (four) times daily.     Marland Kitchen ZIPRASIDONE HCL 60 MG PO CAPS Oral Take 60 mg by mouth at bedtime.    . ASENAPINE MALEATE 5 MG SL SUBL Sublingual Place 5 mg under the tongue at bedtime.    Marland Kitchen DOXAZOSIN MESYLATE 4 MG PO TABS Oral Take 4 mg by mouth at bedtime.    Marland Kitchen FLUTICASONE PROPIONATE 50 MCG/ACT NA SUSP Nasal Place 2 sprays into  the nose daily. 16 g 0  . GUAIFENESIN ER 600 MG PO TB12 Oral Take 2 tablets (1,200 mg total) by mouth 2 (two) times daily. 28 tablet 0  . LAMOTRIGINE 100 MG PO TABS Oral Take 200 mg by mouth 2 (two) times daily.    Marland Kitchen LORAZEPAM 2 MG PO TABS Oral Take 2 mg by mouth 3 (three) times daily.    Marland Kitchen ONDANSETRON HCL 8 MG PO TABS Oral Take by mouth every 8 (eight) hours as needed. For nausea    . SUMATRIPTAN SUCCINATE 100 MG PO TABS Oral Take 100 mg by mouth every 2 (two) hours as needed. MIGRAINE    . SUMATRIPTAN SUCCINATE 50 MG PO TABS Oral Take 1 tablet (50 mg total) by mouth every 2 (two) hours as needed for migraine. 10 tablet 0    BP 110/74  Pulse 95  Temp 98.1 F (36.7 C) (Oral)  Resp 18  SpO2 99%  LMP 02/24/2012  Physical Exam  Nursing note and vitals reviewed. Constitutional: She is oriented to person, place, and time. She appears well-developed and well-nourished.       Appears somewhat uncomfortable. No apparent photophobia.  HENT:  Head: Normocephalic and atraumatic. No trismus in the jaw.    Right Ear: Tympanic membrane normal.  Left Ear: Tympanic membrane normal.  Nose: Mucosal edema and rhinorrhea present. Right sinus exhibits maxillary sinus tenderness and frontal sinus tenderness. Left sinus exhibits maxillary sinus tenderness and frontal sinus tenderness.  Mouth/Throat: Uvula is midline, oropharynx is clear and moist and mucous membranes are normal. Dental caries present.       Tenderness, see drawing.  Eyes: Conjunctivae and EOM are normal. Pupils are equal, round, and reactive to light.  Fundoscopic exam:      The right eye shows no hemorrhage and no papilledema.       The left eye shows no hemorrhage and no papilledema.  Neck: Normal range of motion and full passive range of motion without pain. Neck supple. No Brudzinski's sign and no Kernig's sign noted.         Cervical and occipital Tenderness, see drawing. Tenderness along temporal area bilaterally.     Cardiovascular: Normal rate, regular rhythm and normal heart sounds.   Pulmonary/Chest: Effort normal and breath sounds normal.  Abdominal: Soft. Bowel sounds are normal. She exhibits no distension.  Musculoskeletal: Normal range of motion.  Lymphadenopathy:    She has no cervical adenopathy.  Neurological: She is alert and oriented to person, place, and time. She has normal strength. She displays no tremor. No cranial nerve deficit or sensory deficit. She displays a negative Romberg sign. Coordination and gait normal.       Finger->nose, heel-> shin WNL. Tandem gait steady.   Skin: Skin is warm and dry.  Psychiatric: She has a normal mood and affect. Her behavior is normal.  Judgment and thought content normal.    ED Course  Procedures (including critical care time)  Labs Reviewed - No data to display No results found.   1. Sinus headache   2. Migraine     MDM  Previous records reviewed. As noted in history of present illness.   Pt describing typical pain, no sudden onset. Doubt SAH, ICH or space occupying lesion. Pt without fevers/chills, Pt has no meningeal sx, no nuchal rigidity. Doubt meningitis. Pt with normal neuro exam, no evidence of CVA/TIA.  Pt BP not elevated significantly, doubt hypertensive emergency. No evidence of temporal artery tenderness, no evidence of glaucoma or other occular pathology Today's presentation is suggestive of a complex migraine, but patient appears to have a component of sinusitis based on H&P. Has sinus tenderness, swollen nasal mucosa, is reporting postnasal drip, has a nonproductive cough. Will treat as a migraine here with  headache cocktail (Decadron 10, Imitrex 6 mg [place], toradol 60 IM, benadryl 50 mg) and reassess.  On reevaluation, patient reports complete resolution of all symptoms. Has continued nonfocal neuro exam. Will have her stop all further antihistamines, start her on Mucinex. Starting Flonase, saline nasal irrigation for sinus HA. Will  have her followup with Dr. Neale Burly, her headache specialist within 2 days. Discussed signs and symptoms which should prompt return to the department. Patient and parent agree with plan  Luiz Blare, MD 03/19/12 2145

## 2012-03-19 NOTE — ED Notes (Signed)
Pt c/o migraine x3 days... She went to [facility] ED for the same problem... Was given torodol inj and would like some today... Pt states she is sensitive to light and sound... Denies: fever, vomiting, nausea diarrhea, blurry vision, SOB, and chest pain.... Also denies droopy facial or numbness anywhere.

## 2012-03-25 ENCOUNTER — Encounter (HOSPITAL_COMMUNITY): Payer: Self-pay | Admitting: Emergency Medicine

## 2012-04-06 ENCOUNTER — Emergency Department (HOSPITAL_BASED_OUTPATIENT_CLINIC_OR_DEPARTMENT_OTHER)
Admission: EM | Admit: 2012-04-06 | Discharge: 2012-04-06 | Disposition: A | Discharge status: Home / Self Care | Payer: Medicare Other | Attending: Emergency Medicine | Admitting: Emergency Medicine

## 2012-04-06 ENCOUNTER — Encounter (HOSPITAL_BASED_OUTPATIENT_CLINIC_OR_DEPARTMENT_OTHER): Payer: Self-pay | Admitting: *Deleted

## 2012-04-06 DIAGNOSIS — R51 Headache: Secondary | ICD-10-CM

## 2012-04-06 DIAGNOSIS — G43909 Migraine, unspecified, not intractable, without status migrainosus: Secondary | ICD-10-CM | POA: Insufficient documentation

## 2012-04-06 DIAGNOSIS — F319 Bipolar disorder, unspecified: Secondary | ICD-10-CM | POA: Insufficient documentation

## 2012-04-06 DIAGNOSIS — F411 Generalized anxiety disorder: Secondary | ICD-10-CM | POA: Insufficient documentation

## 2012-04-06 DIAGNOSIS — G8929 Other chronic pain: Secondary | ICD-10-CM | POA: Insufficient documentation

## 2012-04-06 MED ORDER — ONDANSETRON HCL 4 MG/2ML IJ SOLN
4.0000 mg | Freq: Once | INTRAMUSCULAR | Status: AC
  Administered 2012-04-06: 4 mg via INTRAMUSCULAR
  Filled 2012-04-06: qty 2

## 2012-04-06 MED ORDER — HYDROMORPHONE HCL PF 1 MG/ML IJ SOLN
0.5000 mg | Freq: Once | INTRAMUSCULAR | Status: AC
  Administered 2012-04-06: 0.5 mg via INTRAMUSCULAR
  Filled 2012-04-06: qty 1

## 2012-04-06 NOTE — ED Provider Notes (Signed)
History     CSN: [id number]  Arrival date & time 04/06/12  1432   First MD Initiated Contact with Patient 04/06/12 1504      Chief Complaint  Patient presents with  . Migraine    (Consider location/radiation/quality/duration/timing/severity/associated sxs/prior treatment) Patient is a 31 y.o. female presenting with migraines. The history is provided by the patient. No language interpreter was used.  Migraine This is a new problem. The current episode started in the past 7 days. The problem occurs constantly. The problem has been unchanged. Associated symptoms include vomiting. Nothing aggravates the symptoms. She has tried oral narcotics (migrane medications) for the symptoms. The treatment provided no relief.    Past Medical History  Diagnosis Date  . Bipolar 1 disorder   . Migraine   . Mental disorder   . Anxiety   . Interstitial cystitis   . Galactorrhea   . Chronic back pain   . Seasonal allergies   . Sinusitis     Past Surgical History  Procedure Date  . Tubal ligation   . Colon surgery   . Interstim implant placement     Family History  Problem Relation Age of Onset  . Hypertension Mother   . Esophageal cancer Maternal Grandmother   . Diabetes Maternal Grandfather   . Stroke Maternal Grandfather   . Depression Maternal Grandfather   . Anxiety disorder Maternal Grandfather   . Heart disease Other     History  Substance Use Topics  . Smoking status: Never Smoker   . Smokeless tobacco: Never Used  . Alcohol Use: No    OB History    Grav Para Term Preterm Abortions TAB SAB Ect Mult Living   0               Review of Systems  Gastrointestinal: Positive for vomiting.  All other systems reviewed and are negative.    Allergies  Augmentin; Ciprofloxacin; Erythromycin; Gabapentin; Other; and Prozac  Home Medications   Current Outpatient Rx  Name Route Sig Dispense Refill  . ASENAPINE MALEATE 5 MG SL SUBL Sublingual Place 5 mg under the tongue  at bedtime.    Marland Kitchen DOXAZOSIN MESYLATE 4 MG PO TABS Oral Take 4 mg by mouth at bedtime.    Marland Kitchen FAMOTIDINE 20 MG PO TABS Oral Take 20 mg by mouth daily.    Marland Kitchen FLUTICASONE PROPIONATE 50 MCG/ACT NA SUSP Nasal Place 2 sprays into the nose daily. 16 g 0  . GUAIFENESIN ER 600 MG PO TB12 Oral Take 2 tablets (1,200 mg total) by mouth 2 (two) times daily. 28 tablet 0  . LAMOTRIGINE 100 MG PO TABS Oral Take 200 mg by mouth 2 (two) times daily.    Marland Kitchen LORAZEPAM 2 MG PO TABS Oral Take 2 mg by mouth 3 (three) times daily.    Marland Kitchen ONDANSETRON HCL 8 MG PO TABS Oral Take by mouth every 8 (eight) hours as needed. For nausea    . OXYCODONE-ACETAMINOPHEN 10-325 MG PO TABS Oral Take 1 tablet by mouth every 4 (four) hours as needed. For pain    . SUMATRIPTAN SUCCINATE 100 MG PO TABS Oral Take 100 mg by mouth every 2 (two) hours as needed. MIGRAINE    . SUMATRIPTAN SUCCINATE 50 MG PO TABS Oral Take 1 tablet (50 mg total) by mouth every 2 (two) hours as needed for migraine. 10 tablet 0  . TOPIRAMATE 100 MG PO TABS Oral Take 100 mg by mouth 4 (four) times daily.     Marland Kitchen  ZIPRASIDONE HCL 60 MG PO CAPS Oral Take 60 mg by mouth at bedtime.      BP 135/88  Pulse 79  Temp 98.1 F (36.7 C)  Resp 16  Ht 5\' 2"  (1.575 m)  Wt 133 lb (60.328 kg)  BMI 24.33 kg/m2  SpO2 100%  LMP 03/25/2012  Physical Exam  Nursing note and vitals reviewed. Constitutional: She is oriented to person, place, and time. She appears well-developed and well-nourished.  HENT:  Head: Normocephalic and atraumatic.  Eyes: Conjunctivae normal are normal. Pupils are equal, round, and reactive to light.  Neck: Normal range of motion. Neck supple.  Cardiovascular: Normal rate and normal heart sounds.   Pulmonary/Chest: Effort normal.  Abdominal: Soft.  Musculoskeletal: Normal range of motion.  Neurological: She is alert and oriented to person, place, and time. She has normal reflexes.  Skin: Skin is warm.  Psychiatric: She has a normal mood and affect.     ED Course  Procedures (including critical care time)  Labs Reviewed - No data to display No results found.   No diagnosis found.    MDM  Pt request dilaudid.   She reports migrane cocktail and other medications do not work.   I advised pt that she needs to discuss appropriate treatment of her migranes with her neurologist.   I advised her that I will give her Dilaudid today but that we will not continue to give her dilaudid injections in the ED.   I counseled her on addiction and narcotic dependence.  offered her other treatment options and pt requested morphine.         Lonia Skinner [place], [name] 04/06/12 1539  Lonia Skinner [place], [name] 04/06/12 [phone]

## 2012-04-06 NOTE — ED Provider Notes (Signed)
Medical screening examination/treatment/procedure(s) were performed by non-physician practitioner and as supervising physician I was immediately available for consultation/collaboration.   Gerhard Munch, MD 04/06/12 1756

## 2012-04-06 NOTE — ED Notes (Addendum)
Pt had root canal this morning around 9:30. She received shot of Toradol yesterday at PCP around 4:30, pain was unchanged. Pt states Dr. Neale Burly was notified about two weeks ago that medication was not working for headaches anymore.

## 2012-04-06 NOTE — ED Notes (Signed)
Chart reviewed and care assumed. 

## 2012-04-06 NOTE — ED Notes (Signed)
Pt rates pain a 10/10, however states this one is similar to her other migraines.

## 2012-04-15 ENCOUNTER — Encounter (HOSPITAL_BASED_OUTPATIENT_CLINIC_OR_DEPARTMENT_OTHER): Payer: Self-pay | Admitting: *Deleted

## 2012-04-15 ENCOUNTER — Emergency Department (HOSPITAL_BASED_OUTPATIENT_CLINIC_OR_DEPARTMENT_OTHER)
Admission: EM | Admit: 2012-04-15 | Discharge: 2012-04-15 | Disposition: A | Discharge status: Home / Self Care | Payer: Medicare Other | Attending: Emergency Medicine | Admitting: Emergency Medicine

## 2012-04-15 DIAGNOSIS — R300 Dysuria: Secondary | ICD-10-CM | POA: Insufficient documentation

## 2012-04-15 DIAGNOSIS — F319 Bipolar disorder, unspecified: Secondary | ICD-10-CM | POA: Insufficient documentation

## 2012-04-15 DIAGNOSIS — N3010 Interstitial cystitis (chronic) without hematuria: Secondary | ICD-10-CM

## 2012-04-15 LAB — URINE MICROSCOPIC-ADD ON: RBC / HPF: NONE SEEN RBC/hpf (ref <3)

## 2012-04-15 LAB — URINALYSIS, ROUTINE W REFLEX MICROSCOPIC
Bilirubin Urine: NEGATIVE
Glucose, UA: NEGATIVE mg/dL
Ketones, ur: NEGATIVE mg/dL
Leukocytes, UA: NEGATIVE
Nitrite: NEGATIVE
Protein, ur: NEGATIVE mg/dL
Specific Gravity, Urine: 1.008 (ref 1.005–1.030)
Urobilinogen, UA: 0.2 mg/dL (ref 0.0–1.0)
pH: 6.5 (ref 5.0–8.0)

## 2012-04-15 LAB — PREGNANCY, URINE: Preg Test, Ur: NEGATIVE

## 2012-04-15 MED ORDER — KETOROLAC TROMETHAMINE 60 MG/2ML IM SOLN
60.0000 mg | Freq: Once | INTRAMUSCULAR | Status: AC
  Administered 2012-04-15: 60 mg via INTRAMUSCULAR
  Filled 2012-04-15: qty 2

## 2012-04-15 NOTE — ED Provider Notes (Signed)
Medical screening examination/treatment/procedure(s) were performed by non-physician practitioner and as supervising physician I was immediately available for consultation/collaboration.    Carleene Cooper III, MD 04/15/12 [phone]

## 2012-04-15 NOTE — ED Provider Notes (Signed)
History     CSN: [id number]  Arrival date & time 04/15/12  1102   First MD Initiated Contact with Patient 04/15/12 1208      Chief Complaint  Patient presents with  . Dysuria    (Consider location/radiation/quality/duration/timing/severity/associated sxs/prior treatment) Patient is a 31 y.o. female presenting with dysuria. The history is provided by the patient. No language interpreter was used.  Dysuria  This is a recurrent problem. The problem occurs every urination. The problem has been gradually worsening. The pain is at a severity of 5/10. The pain is moderate. There has been no fever. Pertinent negatives include no chills. Past medical history comments: chronic pain,  .  Pt reports she has interstitial cystitis and is having a flare up of bladder pain.    Past Medical History  Diagnosis Date  . Bipolar 1 disorder   . Migraine   . Mental disorder   . Anxiety   . Interstitial cystitis   . Galactorrhea   . Chronic back pain   . Seasonal allergies   . Sinusitis     Past Surgical History  Procedure Date  . Tubal ligation   . Colon surgery   . Interstim implant placement     Family History  Problem Relation Age of Onset  . Hypertension Mother   . Esophageal cancer Maternal Grandmother   . Diabetes Maternal Grandfather   . Stroke Maternal Grandfather   . Depression Maternal Grandfather   . Anxiety disorder Maternal Grandfather   . Heart disease Other     History  Substance Use Topics  . Smoking status: Never Smoker   . Smokeless tobacco: Never Used  . Alcohol Use: No    OB History    Grav Para Term Preterm Abortions TAB SAB Ect Mult Living   0               Review of Systems  Constitutional: Negative for chills.  Genitourinary: Positive for dysuria.  All other systems reviewed and are negative.    Allergies  Augmentin; Ciprofloxacin; Erythromycin; Gabapentin; Other; and Prozac  Home Medications   Current Outpatient Rx  Name Route Sig  Dispense Refill  . MIRABEGRON ER 25 MG PO TB24 Oral Take 25 mg by mouth daily.    . ASENAPINE MALEATE 5 MG SL SUBL Sublingual Place 5 mg under the tongue at bedtime.    Marland Kitchen DOXAZOSIN MESYLATE 4 MG PO TABS Oral Take 4 mg by mouth at bedtime.    Marland Kitchen FAMOTIDINE 20 MG PO TABS Oral Take 20 mg by mouth daily.    Marland Kitchen FLUTICASONE PROPIONATE 50 MCG/ACT NA SUSP Nasal Place 2 sprays into the nose daily. 16 g 0  . GUAIFENESIN ER 600 MG PO TB12 Oral Take 2 tablets (1,200 mg total) by mouth 2 (two) times daily. 28 tablet 0  . LAMOTRIGINE 100 MG PO TABS Oral Take 200 mg by mouth 2 (two) times daily.    Marland Kitchen LORAZEPAM 2 MG PO TABS Oral Take 2 mg by mouth 3 (three) times daily.    Marland Kitchen ONDANSETRON HCL 8 MG PO TABS Oral Take by mouth every 8 (eight) hours as needed. For nausea    . OXYCODONE-ACETAMINOPHEN 10-325 MG PO TABS Oral Take 1 tablet by mouth every 4 (four) hours as needed. For pain    . SUMATRIPTAN SUCCINATE 100 MG PO TABS Oral Take 100 mg by mouth every 2 (two) hours as needed. MIGRAINE    . SUMATRIPTAN SUCCINATE 50 MG PO TABS Oral  Take 1 tablet (50 mg total) by mouth every 2 (two) hours as needed for migraine. 10 tablet 0  . TOPIRAMATE 100 MG PO TABS Oral Take 100 mg by mouth 4 (four) times daily.     Marland Kitchen ZIPRASIDONE HCL 60 MG PO CAPS Oral Take 60 mg by mouth at bedtime.      BP 119/72  Pulse 91  Temp 98.4 F (36.9 C) (Oral)  Resp 16  Ht 5\' 2"  (1.575 m)  Wt 133 lb (60.328 kg)  BMI 24.33 kg/m2  SpO2 100%  LMP 03/25/2012  Physical Exam  Nursing note and vitals reviewed. Constitutional: She is oriented to person, place, and time. She appears well-developed and well-nourished.  HENT:  Head: Normocephalic and atraumatic.  Eyes: Pupils are equal, round, and reactive to light.  Cardiovascular: Normal rate.   Pulmonary/Chest: Effort normal.  Abdominal: Soft.  Musculoskeletal: Normal range of motion.  Neurological: She is alert and oriented to person, place, and time.  Skin: Skin is warm.  Psychiatric: She  has a normal mood and affect.    ED Course  Procedures (including critical care time)   Labs Reviewed  PREGNANCY, URINE  URINALYSIS, ROUTINE W REFLEX MICROSCOPIC   No results found.   No diagnosis found.    MDM  Urine is normal.   Pt given injection of torodol and advised to see her urologist.   Pt advised talk with her doctor about pain management       Elson Areas, PA 04/15/12 1230

## 2012-04-15 NOTE — ED Notes (Signed)
Pt presents to ED today with IC flareup.  Pt reports has been ongoing for over 13 years.  Pt recently saw PMD and was given rx and "its too expensive"  UA requested.  Pt unable to void at this time.

## 2012-04-28 ENCOUNTER — Encounter (HOSPITAL_COMMUNITY): Payer: Self-pay | Admitting: Psychiatry

## 2012-04-28 ENCOUNTER — Ambulatory Visit (INDEPENDENT_AMBULATORY_CARE_PROVIDER_SITE_OTHER): Payer: Medicare Other | Admitting: Psychiatry

## 2012-04-28 VITALS — BP 120/80 | HR 64 | Wt 137.2 lb

## 2012-04-28 DIAGNOSIS — F3160 Bipolar disorder, current episode mixed, unspecified: Secondary | ICD-10-CM

## 2012-04-28 DIAGNOSIS — F3110 Bipolar disorder, current episode manic without psychotic features, unspecified: Secondary | ICD-10-CM | POA: Insufficient documentation

## 2012-04-28 DIAGNOSIS — F3162 Bipolar disorder, current episode mixed, moderate: Secondary | ICD-10-CM

## 2012-04-28 DIAGNOSIS — F603 Borderline personality disorder: Secondary | ICD-10-CM

## 2012-04-28 MED ORDER — OLANZAPINE 10 MG PO TABS: 10.0000 mg | ORAL_TABLET | Freq: Every day | ORAL | Status: DC
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
## 2012-05-03 ENCOUNTER — Emergency Department (INDEPENDENT_AMBULATORY_CARE_PROVIDER_SITE_OTHER)
Admission: EM | Admit: 2012-05-03 | Discharge: 2012-05-03 | Disposition: A | Discharge status: Home / Self Care | Payer: Medicare Other | Source: Home / Self Care | Attending: Family Medicine | Admitting: Family Medicine

## 2012-05-03 ENCOUNTER — Encounter (HOSPITAL_COMMUNITY): Payer: Self-pay | Admitting: Emergency Medicine

## 2012-05-03 DIAGNOSIS — G43909 Migraine, unspecified, not intractable, without status migrainosus: Secondary | ICD-10-CM

## 2012-05-03 MED ORDER — KETOROLAC TROMETHAMINE 30 MG/ML IJ SOLN
INTRAMUSCULAR | Status: AC
  Filled 2012-05-03: qty 1

## 2012-05-03 MED ORDER — KETOROLAC TROMETHAMINE 30 MG/ML IJ SOLN
30.0000 mg | Freq: Once | INTRAMUSCULAR | Status: AC
  Administered 2012-05-03: 30 mg via INTRAMUSCULAR

## 2012-05-03 NOTE — ED Provider Notes (Signed)
History     CSN: [id number]  Arrival date & time 05/03/12  1946   First MD Initiated Contact with Patient 05/03/12 1955      Chief Complaint  Patient presents with  . Migraine    (Consider location/radiation/quality/duration/timing/severity/associated sxs/prior treatment) Patient is a 31 y.o. female presenting with migraines. The history is provided by the patient and a parent.  Migraine This is a new problem. The current episode started 12 to 24 hours ago. The problem has not changed since onset.Associated symptoms include headaches. Nothing aggravates the symptoms. Treatments tried: has not tried her fentanyl  today.    Past Medical History  Diagnosis Date  . Bipolar 1 disorder   . Migraine   . Mental disorder   . Anxiety   . Interstitial cystitis   . Galactorrhea   . Chronic back pain   . Seasonal allergies   . Sinusitis     Past Surgical History  Procedure Date  . Tubal ligation   . Colon surgery   . Interstim implant placement     Family History  Problem Relation Age of Onset  . Hypertension Mother   . Esophageal cancer Maternal Grandmother   . Diabetes Maternal Grandfather   . Stroke Maternal Grandfather   . Depression Maternal Grandfather   . Anxiety disorder Maternal Grandfather   . Heart disease Other     History  Substance Use Topics  . Smoking status: Never Smoker   . Smokeless tobacco: Never Used  . Alcohol Use: No    OB History    Grav Para Term Preterm Abortions TAB SAB Ect Mult Living   0               Review of Systems  Constitutional: Negative.   Neurological: Positive for headaches.    Allergies  Augmentin; Ciprofloxacin; Erythromycin; Gabapentin; Other; and Prozac  Home Medications   Current Outpatient Rx  Name Route Sig Dispense Refill  . AZELASTINE HCL 137 MCG/SPRAY NA SOLN Nasal Place 1 spray into the nose 2 (two) times daily. Use in each nostril as directed    . DOXAZOSIN MESYLATE 4 MG PO TABS Oral Take 4 mg by mouth  at bedtime.    Marland Kitchen FAMOTIDINE 20 MG PO TABS Oral Take 20 mg by mouth daily.    Marland Kitchen FLUTICASONE PROPIONATE 50 MCG/ACT NA SUSP Nasal Place 2 sprays into the nose daily. 16 g 0  . GUAIFENESIN ER 600 MG PO TB12 Oral Take 2 tablets (1,200 mg total) by mouth 2 (two) times daily. 28 tablet 0  . LAMOTRIGINE 100 MG PO TABS Oral Take 200 mg by mouth 2 (two) times daily.    Marland Kitchen LORAZEPAM 2 MG PO TABS Oral Take 2 mg by mouth 3 (three) times daily.    Marland Kitchen MIRABEGRON ER 25 MG PO TB24 Oral Take 25 mg by mouth daily.    Marland Kitchen OLANZAPINE 10 MG PO TABS Oral Take 1 tablet (10 mg total) by mouth at bedtime. 30 tablet 5  . ONDANSETRON HCL 8 MG PO TABS Oral Take by mouth every 8 (eight) hours as needed. For nausea    . OXYCODONE-ACETAMINOPHEN 10-325 MG PO TABS Oral Take 1 tablet by mouth every 4 (four) hours as needed. For pain    . SUMATRIPTAN SUCCINATE 100 MG PO TABS Oral Take 100 mg by mouth every 2 (two) hours as needed. MIGRAINE    . SUMATRIPTAN SUCCINATE 50 MG PO TABS Oral Take 1 tablet (50 mg total) by mouth  every 2 (two) hours as needed for migraine. 10 tablet 0  . TOPIRAMATE 100 MG PO TABS Oral Take 100 mg by mouth 4 (four) times daily.       BP 121/77  Pulse 78  Temp 98.7 F (37.1 C) (Oral)  Resp 16  SpO2 100%  LMP 03/25/2012  Physical Exam  Nursing note and vitals reviewed. Constitutional: She is oriented to person, place, and time. She appears well-developed and well-nourished.  HENT:  Head: Normocephalic.  Right Ear: External ear normal.  Left Ear: External ear normal.  Mouth/Throat: Oropharynx is clear and moist.  Eyes: Conjunctivae normal and EOM are normal. Pupils are equal, round, and reactive to light.  Neck: Normal range of motion. Neck supple.  Lymphadenopathy:    She has no cervical adenopathy.  Neurological: She is alert and oriented to person, place, and time. No cranial nerve deficit.  Skin: Skin is warm and dry.  Psychiatric: She has a normal mood and affect.    ED Course  Procedures  (including critical care time)  Labs Reviewed - No data to display No results found.   1. Migraine       MDM          Linna Hoff, MD 05/03/12 2100

## 2012-05-03 NOTE — ED Notes (Signed)
Report migraine started today.

## 2012-06-28 ENCOUNTER — Ambulatory Visit (INDEPENDENT_AMBULATORY_CARE_PROVIDER_SITE_OTHER): Payer: Medicare Other | Admitting: Psychiatry

## 2012-06-28 DIAGNOSIS — F3160 Bipolar disorder, current episode mixed, unspecified: Secondary | ICD-10-CM

## 2012-06-28 MED ORDER — LORAZEPAM 2 MG PO TABS: 2.0000 mg | ORAL_TABLET | Freq: Three times a day (TID) | ORAL | Status: DC

## 2012-06-28 MED ORDER — LAMOTRIGINE 100 MG PO TABS: 200.0000 mg | ORAL_TABLET | Freq: Two times a day (BID) | ORAL | Status: DC

## 2012-06-28 NOTE — Progress Notes (Signed)
[facility] MD Progress Note  06/28/2012 1:39 PM [name]  MRN:  [id number] Subjective:   Diagnosis:  Axis I: Bipolar, mixed Today this was a return appointment for this patient. She however is going to change her setting to see me in a different environment. Today the patient is doing actually very well. She's no longer feeling depressed. Her sleep is better she can concentrate. She recently had a physical exam in all over interstitial cystitis seems to be cleared up and she has minimal complaints of pain. She has been taking the Zyprexa 10 mg and is doing well. ADL's:  Impaired  Sleep: Good  Appetite:  Good  Suicidal Ideation:  no Homicidal Ideation:  no AEB (as evidenced by):  Psychiatric Specialty Exam: ROS  There were no vitals taken for this visit.There is no height or weight on file to calculate BMI.  General Appearance: Casual  Eye Contact::  Good  Speech:  Clear and Coherent  Volume:  Normal  Mood:  normal  Affect:  Appropriate  Thought Process:  Goal Directed  Orientation:  Full (Time, Place, and Person)  Thought Content:  WDL  Suicidal Thoughts:  No  Homicidal Thoughts:  No  Memory:  nl  Judgement:  Good  Insight:  Good  Psychomotor Activity:  Negative  Concentration:  Good  Recall:  Good  Akathisia:  No  Handed:  Right  AIMS (if indicated):     Assets:  Desire for Improvement  Sleep:      Current Medications: Current Outpatient Prescriptions  Medication Sig Dispense Refill  . azelastine (ASTELIN) 137 MCG/SPRAY nasal spray Place 1 spray into the nose 2 (two) times daily. Use in each nostril as directed      . doxazosin (CARDURA) 4 MG tablet Take 4 mg by mouth at bedtime.      . famotidine (PEPCID) 20 MG tablet Take 20 mg by mouth daily.      . fluticasone (FLONASE) 50 MCG/ACT nasal spray Place 2 sprays into the nose daily.  16 g  0  . guaiFENesin (MUCINEX) 600 MG 12 hr tablet Take 2 tablets (1,200 mg total) by mouth 2 (two) times daily.  28 tablet  0  .  lamoTRIgine (LAMICTAL) 100 MG tablet Take 200 mg by mouth 2 (two) times daily.      Marland Kitchen LORazepam (ATIVAN) 2 MG tablet Take 2 mg by mouth 3 (three) times daily.      . mirabegron ER (MYRBETRIQ) 25 MG TB24 Take 25 mg by mouth daily.      Marland Kitchen OLANZapine (ZYPREXA) 10 MG tablet Take 1 tablet (10 mg total) by mouth at bedtime.  30 tablet  5  . ondansetron (ZOFRAN) 8 MG tablet Take by mouth every 8 (eight) hours as needed. For nausea      . oxyCODONE-acetaminophen (PERCOCET) 10-325 MG per tablet Take 1 tablet by mouth every 4 (four) hours as needed. For pain      . SUMAtriptan (IMITREX) 100 MG tablet Take 100 mg by mouth every 2 (two) hours as needed. MIGRAINE      . SUMAtriptan (IMITREX) 50 MG tablet Take 1 tablet (50 mg total) by mouth every 2 (two) hours as needed for migraine.  10 tablet  0  . topiramate (TOPAMAX) 100 MG tablet Take 100 mg by mouth 4 (four) times daily.       . [DISCONTINUED] chlorproMAZINE (THORAZINE) 25 MG tablet Take 25 mg by mouth 3 (three) times daily.      . [  DISCONTINUED] diphenhydrAMINE (BENADRYL) 25 MG tablet Take 25 mg by mouth every 6 (six) hours as needed.        Lab Results: No results found for this or any previous visit (from the past 48 hour(s)).  Physical Findings: AIMS:  , ,  ,  ,    CIWA:    COWS:     Treatment Plan Summary: Daily contact with patient to assess and evaluate symptoms and progress in treatment Medication management  Plan:at this time I will go ahead and reorder her Zyprexa, Ativan and Lamictal. The patient will not make a return appointment in the setting.  Medical Decision Making Problem Points:  Established problem, stable/improving (1) Data Points:  Review of new medications or change in dosage (2)  I certify that inpatient services furnished can reasonably be expected to improve the patient's condition.   [name] 06/28/2012, 1:39 PM

## 2012-08-12 ENCOUNTER — Encounter (HOSPITAL_COMMUNITY): Payer: Self-pay | Admitting: *Deleted

## 2012-08-12 ENCOUNTER — Emergency Department (HOSPITAL_COMMUNITY)
Admission: EM | Admit: 2012-08-12 | Discharge: 2012-08-12 | Disposition: A | Discharge status: Home / Self Care | Payer: Medicare Other | Source: Home / Self Care | Attending: Emergency Medicine | Admitting: Emergency Medicine

## 2012-08-12 DIAGNOSIS — J019 Acute sinusitis, unspecified: Secondary | ICD-10-CM

## 2012-08-12 MED ORDER — AMOXICILLIN 500 MG PO CAPS: 1000.0000 mg | ORAL_CAPSULE | Freq: Three times a day (TID) | ORAL | Status: DC

## 2012-08-12 NOTE — ED Notes (Signed)
C/o chest cold 1/2 and finished Amoxicillin.  Symptoms onset yesterday of nasal congestion runny/stuffy nose, laryngitis, and cough.  Cough is not productive.  She is using the Tessalon she had left over from previous infection with relief.  No sore throat, earache or fever but does have chills.

## 2012-08-12 NOTE — ED Provider Notes (Signed)
History     CSN: [id number]  Arrival date & time 08/12/12  1245   First MD Initiated Contact with Patient 08/12/12 1533      Chief Complaint  Patient presents with  . URI    HPI: Patient is a 32 y.o. female presenting with URI. The history is provided by the patient.  URI The primary symptoms include fatigue and cough. Primary symptoms do not include fever, headaches, ear pain, sore throat, swollen glands, wheezing, abdominal pain, nausea, vomiting, myalgias, arthralgias or rash. The current episode started yesterday. This is a new problem. The problem has been gradually worsening.  Symptoms associated with the illness include chills, sinus pressure and rhinorrhea.  Pt reports onset severe sinus congestion and pressure yesterday. Reports chills last night but is unsure if she had fever. Reports lots of nasal secretions that are orange/yellow in color and sometimes blood tinged. She has an occasional cough which she thinks is allergies and is relieved w/ Tessalon Perles. Denies body aches, chest congestion, N/V/D or other associated symptoms. States her sinuses hurt to touch and today she is somewhat hoarse. Pt states she started Amoxicillin yesterday from a recent Rx she received for her "bad teeth". When asked about H/A's she states she wears a Fentanyl patch so unsure if she would know.  Past Medical History  Diagnosis Date  . Bipolar 1 disorder   . Migraine   . Mental disorder   . Anxiety   . Interstitial cystitis   . Galactorrhea   . Chronic back pain   . Seasonal allergies   . Sinusitis     Past Surgical History  Procedure Date  . Tubal ligation   . Colon surgery   . Interstim implant placement   . Bowel resection 1998    prolapsed bowel  . Foot surgery     bunionectomy bil. pins put in and taken out  . Bladder surgery     distenstion numerous times    Family History  Problem Relation Age of Onset  . Hypertension Mother   . Esophageal cancer Maternal Grandmother    . Diabetes Maternal Grandfather   . Stroke Maternal Grandfather   . Depression Maternal Grandfather   . Anxiety disorder Maternal Grandfather   . Heart disease Other     History  Substance Use Topics  . Smoking status: Never Smoker   . Smokeless tobacco: Never Used  . Alcohol Use: No    OB History    Grav Para Term Preterm Abortions TAB SAB Ect Mult Living   0               Review of Systems  Constitutional: Positive for chills and fatigue. Negative for fever.  HENT: Positive for rhinorrhea, dental problem, postnasal drip and sinus pressure. Negative for ear pain, nosebleeds, sore throat, facial swelling, sneezing, mouth sores, neck pain, neck stiffness and ear discharge.   Eyes: Negative.   Respiratory: Positive for cough. Negative for wheezing.   Cardiovascular: Negative.   Gastrointestinal: Negative for nausea, vomiting and abdominal pain.  Genitourinary: Negative.   Musculoskeletal: Negative for myalgias and arthralgias.  Skin: Negative for rash.  Neurological: Negative for dizziness and headaches.  Hematological: Negative.   Psychiatric/Behavioral: Negative.     Allergies  Augmentin; Ciprofloxacin; Erythromycin; Gabapentin; Other; and Prozac  Home Medications   Current Outpatient Rx  Name  Route  Sig  Dispense  Refill  . AZELASTINE HCL 137 MCG/SPRAY NA SOLN   Nasal   Place 1 spray  into the nose 2 (two) times daily. Use in each nostril as directed         . DESIPRAMINE HCL 25 MG PO TABS   Oral   Take 25 mg by mouth daily.         Marland Kitchen DICLOFENAC SODIUM 1 % TD GEL   Topical   Apply topically.         Marland Kitchen DOXAZOSIN MESYLATE 4 MG PO TABS   Oral   Take 4 mg by mouth at bedtime.         Marland Kitchen FAMOTIDINE 20 MG PO TABS   Oral   Take 20 mg by mouth daily.         . FENTANYL 25 MCG/HR TD PT72   Transdermal   Place 1 patch onto the skin every 3 (three) days.         . OMEGA-3 FATTY ACIDS 1000 MG PO CAPS   Oral   Take 1 g by mouth daily.         Marland Kitchen  FLUTICASONE PROPIONATE 50 MCG/ACT NA SUSP   Nasal   Place 2 sprays into the nose daily.   16 g   0   . LAMOTRIGINE 100 MG PO TABS   Oral   Take 2 tablets (200 mg total) by mouth 2 (two) times daily.   120 tablet   10   . LEVOCETIRIZINE DIHYDROCHLORIDE 5 MG PO TABS   Oral   Take 5 mg by mouth every evening.         Marland Kitchen LORAZEPAM 2 MG PO TABS   Oral   Take 1 tablet (2 mg total) by mouth 3 (three) times daily.   90 tablet   2   . OLANZAPINE 10 MG PO TABS   Oral   Take 1 tablet (10 mg total) by mouth at bedtime.   30 tablet   5   . ONDANSETRON HCL 8 MG PO TABS   Oral   Take by mouth every 8 (eight) hours as needed. For nausea         . OXYBUTYNIN CHLORIDE ER 15 MG PO TB24   Oral   Take 15 mg by mouth 2 (two) times daily.         Marland Kitchen TEMAZEPAM 15 MG PO CAPS   Oral   Take 30 mg by mouth at bedtime as needed.         . TOPIRAMATE 100 MG PO TABS   Oral   Take 100 mg by mouth 4 (four) times daily.          . GUAIFENESIN ER 600 MG PO TB12   Oral   Take 2 tablets (1,200 mg total) by mouth 2 (two) times daily.   28 tablet   0   . MIRABEGRON ER 25 MG PO TB24   Oral   Take 25 mg by mouth daily.         . OXYCODONE-ACETAMINOPHEN 10-325 MG PO TABS   Oral   Take 1 tablet by mouth every 4 (four) hours as needed. For pain         . SUMATRIPTAN SUCCINATE 100 MG PO TABS   Oral   Take 100 mg by mouth every 2 (two) hours as needed. MIGRAINE         . SUMATRIPTAN SUCCINATE 50 MG PO TABS   Oral   Take 1 tablet (50 mg total) by mouth every 2 (two) hours as needed for migraine.   10 tablet  0     BP 122/80  Pulse 71  Temp 98.2 F (36.8 C) (Oral)  Resp 19  SpO2 100%  LMP 07/26/2012  Physical Exam  Constitutional: She is oriented to person, place, and time. She appears well-developed and well-nourished.  HENT:  Head: Normocephalic and atraumatic.  Right Ear: Tympanic membrane, external ear and ear canal normal.  Left Ear: Tympanic membrane, external  ear and ear canal normal.  Nose: Mucosal edema, rhinorrhea and sinus tenderness present. Right sinus exhibits maxillary sinus tenderness. Right sinus exhibits no frontal sinus tenderness. Left sinus exhibits maxillary sinus tenderness. Left sinus exhibits no frontal sinus tenderness.       Turbinates very edematous and erythematous w/ purulent drainage noted  Eyes: Conjunctivae normal are normal.  Neck: Neck supple.  Cardiovascular: Normal rate and regular rhythm.   Pulmonary/Chest: Breath sounds normal.  Musculoskeletal: Normal range of motion.  Lymphadenopathy:    She has no cervical adenopathy.  Neurological: She is alert and oriented to person, place, and time.  Skin: Skin is warm and dry.  Psychiatric: She has a normal mood and affect.    ED Course  Procedures   Labs Reviewed - No data to display No results found.   No diagnosis found.    MDM  Though recent onset sx's and PE suggestive of acute sinusitis. Denies associated sx's. Will treat w/ amoxicillin and encourage PCP f/u.        Leanne Chang, NP 08/12/12 1623

## 2012-08-12 NOTE — ED Provider Notes (Signed)
Medical screening examination/treatment/procedure(s) were performed by non-physician practitioner and as supervising physician I was immediately available for consultation/collaboration.  Leslee Home, M.D.   Reuben Likes, MD 08/12/12 [phone]

## 2012-08-30 ENCOUNTER — Emergency Department (HOSPITAL_COMMUNITY): Payer: Medicare Other

## 2012-08-30 ENCOUNTER — Encounter (HOSPITAL_COMMUNITY): Payer: Self-pay | Admitting: Emergency Medicine

## 2012-08-30 ENCOUNTER — Inpatient Hospital Stay (HOSPITAL_COMMUNITY)
Admission: EM | Admit: 2012-08-30 | Discharge: 2012-09-02 | DRG: 917 | Disposition: A | Discharge status: Other Acute Inpatient Hospital | Payer: Medicare Other | Attending: Internal Medicine | Admitting: Internal Medicine

## 2012-08-30 DIAGNOSIS — Z79899 Other long term (current) drug therapy: Secondary | ICD-10-CM

## 2012-08-30 DIAGNOSIS — L293 Anogenital pruritus, unspecified: Secondary | ICD-10-CM

## 2012-08-30 DIAGNOSIS — N390 Urinary tract infection, site not specified: Secondary | ICD-10-CM

## 2012-08-30 DIAGNOSIS — IMO0002 Reserved for concepts with insufficient information to code with codable children: Secondary | ICD-10-CM

## 2012-08-30 DIAGNOSIS — M549 Dorsalgia, unspecified: Secondary | ICD-10-CM | POA: Diagnosis present

## 2012-08-30 DIAGNOSIS — E876 Hypokalemia: Secondary | ICD-10-CM | POA: Diagnosis present

## 2012-08-30 DIAGNOSIS — L708 Other acne: Secondary | ICD-10-CM

## 2012-08-30 DIAGNOSIS — N3010 Interstitial cystitis (chronic) without hematuria: Secondary | ICD-10-CM

## 2012-08-30 DIAGNOSIS — T50902A Poisoning by unspecified drugs, medicaments and biological substances, intentional self-harm, initial encounter: Secondary | ICD-10-CM | POA: Diagnosis present

## 2012-08-30 DIAGNOSIS — G9341 Metabolic encephalopathy: Secondary | ICD-10-CM

## 2012-08-30 DIAGNOSIS — K589 Irritable bowel syndrome without diarrhea: Secondary | ICD-10-CM

## 2012-08-30 DIAGNOSIS — N760 Acute vaginitis: Secondary | ICD-10-CM

## 2012-08-30 DIAGNOSIS — T1491XA Suicide attempt, initial encounter: Secondary | ICD-10-CM | POA: Diagnosis present

## 2012-08-30 DIAGNOSIS — R12 Heartburn: Secondary | ICD-10-CM

## 2012-08-30 DIAGNOSIS — T50901A Poisoning by unspecified drugs, medicaments and biological substances, accidental (unintentional), initial encounter: Secondary | ICD-10-CM

## 2012-08-30 DIAGNOSIS — G8929 Other chronic pain: Secondary | ICD-10-CM

## 2012-08-30 DIAGNOSIS — Z975 Presence of (intrauterine) contraceptive device: Secondary | ICD-10-CM

## 2012-08-30 DIAGNOSIS — N912 Amenorrhea, unspecified: Secondary | ICD-10-CM

## 2012-08-30 DIAGNOSIS — IMO0001 Reserved for inherently not codable concepts without codable children: Secondary | ICD-10-CM

## 2012-08-30 DIAGNOSIS — F3110 Bipolar disorder, current episode manic without psychotic features, unspecified: Secondary | ICD-10-CM

## 2012-08-30 DIAGNOSIS — F3189 Other bipolar disorder: Secondary | ICD-10-CM

## 2012-08-30 DIAGNOSIS — F429 Obsessive-compulsive disorder, unspecified: Secondary | ICD-10-CM

## 2012-08-30 DIAGNOSIS — T6592XA Toxic effect of unspecified substance, intentional self-harm, initial encounter: Secondary | ICD-10-CM

## 2012-08-30 DIAGNOSIS — L0233 Carbuncle of buttock: Secondary | ICD-10-CM

## 2012-08-30 DIAGNOSIS — E872 Acidosis, unspecified: Secondary | ICD-10-CM

## 2012-08-30 DIAGNOSIS — I959 Hypotension, unspecified: Secondary | ICD-10-CM

## 2012-08-30 DIAGNOSIS — R110 Nausea: Secondary | ICD-10-CM

## 2012-08-30 DIAGNOSIS — F314 Bipolar disorder, current episode depressed, severe, without psychotic features: Secondary | ICD-10-CM

## 2012-08-30 LAB — BLOOD GAS, ARTERIAL
Acid-base deficit: 8.2 mmol/L — ABNORMAL HIGH (ref 0.0–2.0)
Bicarbonate: 14.7 mEq/L — ABNORMAL LOW (ref 20.0–24.0)
Drawn by: 28701
O2 Content: 2.5 L/min
O2 Saturation: 98.6 %
Patient temperature: 98.6
TCO2: 13 mmol/L (ref 0–100)
pCO2 arterial: 24.4 mmHg — ABNORMAL LOW (ref 35.0–45.0)
pH, Arterial: 7.397 (ref 7.350–7.450)
pO2, Arterial: 134 mmHg — ABNORMAL HIGH (ref 80.0–100.0)

## 2012-08-30 LAB — COMPREHENSIVE METABOLIC PANEL
ALT: 22 U/L (ref 0–35)
AST: 38 U/L — ABNORMAL HIGH (ref 0–37)
Albumin: 4.6 g/dL (ref 3.5–5.2)
Alkaline Phosphatase: 60 U/L (ref 39–117)
BUN: 21 mg/dL (ref 6–23)
CO2: 17 mEq/L — ABNORMAL LOW (ref 19–32)
Calcium: 9.5 mg/dL (ref 8.4–10.5)
Chloride: 101 mEq/L (ref 96–112)
Creatinine, Ser: 0.93 mg/dL (ref 0.50–1.10)
GFR calc Af Amer: >90 mL/min (ref >90)
GFR calc non Af Amer: 80 mL/min — ABNORMAL LOW (ref >90)
Glucose, Bld: 122 mg/dL — ABNORMAL HIGH (ref 70–99)
Potassium: 4.8 mEq/L (ref 3.5–5.1)
Sodium: 136 mEq/L (ref 135–145)
Total Bilirubin: 0.3 mg/dL (ref 0.3–1.2)
Total Protein: 7.9 g/dL (ref 6.0–8.3)

## 2012-08-30 LAB — CBC WITH DIFFERENTIAL/PLATELET
Basophils Absolute: 0 10*3/uL (ref 0.0–0.1)
Basophils Relative: 0 % (ref 0–1)
Eosinophils Absolute: 0 10*3/uL (ref 0.0–0.7)
Eosinophils Relative: 0 % (ref 0–5)
HCT: 39.7 % (ref 36.0–46.0)
Hemoglobin: 13.8 g/dL (ref 12.0–15.0)
Lymphocytes Relative: 11 % — ABNORMAL LOW (ref 12–46)
Lymphs Abs: 1.6 10*3/uL (ref 0.7–4.0)
MCH: 30.4 pg (ref 26.0–34.0)
MCHC: 34.8 g/dL (ref 30.0–36.0)
MCV: 87.4 fL (ref 78.0–100.0)
Monocytes Absolute: 1 10*3/uL (ref 0.1–1.0)
Monocytes Relative: 7 % (ref 3–12)
Neutro Abs: 12 10*3/uL — ABNORMAL HIGH (ref 1.7–7.7)
Neutrophils Relative %: 82 % — ABNORMAL HIGH (ref 43–77)
Platelets: 253 10*3/uL (ref 150–400)
RBC: 4.54 MIL/uL (ref 3.87–5.11)
RDW: 12.2 % (ref 11.5–15.5)
WBC: 14.6 10*3/uL — ABNORMAL HIGH (ref 4.0–10.5)

## 2012-08-30 LAB — URINE MICROSCOPIC-ADD ON

## 2012-08-30 LAB — ACETAMINOPHEN LEVEL: Acetaminophen (Tylenol), Serum: <15 ug/mL (ref 10–30)

## 2012-08-30 LAB — URINALYSIS, ROUTINE W REFLEX MICROSCOPIC
Glucose, UA: NEGATIVE mg/dL
Hgb urine dipstick: NEGATIVE
Ketones, ur: 15 mg/dL — AB
Nitrite: POSITIVE — AB
Protein, ur: 30 mg/dL — AB
Specific Gravity, Urine: >1.046 — ABNORMAL HIGH (ref 1.005–1.030)
Urobilinogen, UA: 1 mg/dL (ref 0.0–1.0)
pH: 6 (ref 5.0–8.0)

## 2012-08-30 LAB — SALICYLATE LEVEL: Salicylate Lvl: <2 mg/dL — ABNORMAL LOW (ref 2.8–20.0)

## 2012-08-30 LAB — CK: Total CK: 222 U/L — ABNORMAL HIGH (ref 7–177)

## 2012-08-30 LAB — POCT PREGNANCY, URINE: Preg Test, Ur: NEGATIVE

## 2012-08-30 LAB — ETHANOL: Alcohol, Ethyl (B): <11 mg/dL (ref 0–11)

## 2012-08-30 MED ORDER — SODIUM CHLORIDE 0.9 % IV SOLN
INTRAVENOUS | Status: DC
  Administered 2012-08-31: 125 mL/h via INTRAVENOUS
  Administered 2012-09-01: 08:00:00 via INTRAVENOUS

## 2012-08-30 NOTE — ED Notes (Signed)
Pt arrived via EMS with a chief complaint of overdose.  Pt's medications are said to be narcotics.  Pt airway is intact.  Pt is combative.

## 2012-08-30 NOTE — ED Provider Notes (Signed)
History     CSN: [id number]  Arrival date & time 08/30/12  2017   First MD Initiated Contact with Patient 08/30/12 2023      No chief complaint on file.   (Consider location/radiation/quality/duration/timing/severity/associated sxs/prior treatment) The history is provided by the EMS personnel. The history is limited by the condition of the patient.   Patient here with altered mental status. According to EMS, she was found by her mother and the bathroom and it was noted that the shower curtain was detached. She had pinpoint pupils and had a respiratory rate of about 6. She was noted to have a fentanyl patch on which was removed. She's also given 1 mg of Narcan and became combative. She takes copious amounts of medications for her bipolar disorder. According to the mother, patient did not have any recent history of suicidal ideology. Past Medical History  Diagnosis Date  . Bipolar 1 disorder   . Migraine   . Mental disorder   . Anxiety   . Interstitial cystitis   . Galactorrhea   . Chronic back pain   . Seasonal allergies   . Sinusitis     Past Surgical History  Procedure Laterality Date  . Tubal ligation    . Colon surgery    . Interstim implant placement    . Bowel resection  1998    prolapsed bowel  . Foot surgery      bunionectomy bil. pins put in and taken out  . Bladder surgery      distenstion numerous times    Family History  Problem Relation Age of Onset  . Hypertension Mother   . Esophageal cancer Maternal Grandmother   . Diabetes Maternal Grandfather   . Stroke Maternal Grandfather   . Depression Maternal Grandfather   . Anxiety disorder Maternal Grandfather   . Heart disease Other     History  Substance Use Topics  . Smoking status: Never Smoker   . Smokeless tobacco: Never Used  . Alcohol Use: No    OB History   Grav Para Term Preterm Abortions TAB SAB Ect Mult Living   0               Review of Systems  Unable to perform  ROS   Allergies  Augmentin; Ciprofloxacin; Erythromycin; Gabapentin; Other; and Prozac  Home Medications   Current Outpatient Rx  Name  Route  Sig  Dispense  Refill  . amoxicillin (AMOXIL) 500 MG capsule   Oral   Take 2 capsules (1,000 mg total) by mouth 3 (three) times daily. For 10 days   60 capsule   0   . azelastine (ASTELIN) 137 MCG/SPRAY nasal spray   Nasal   Place 1 spray into the nose 2 (two) times daily. Use in each nostril as directed         . desipramine (NORPRAMIN) 25 MG tablet   Oral   Take 25 mg by mouth daily.         . diclofenac sodium (VOLTAREN) 1 % GEL   Topical   Apply topically.         . doxazosin (CARDURA) 4 MG tablet   Oral   Take 4 mg by mouth at bedtime.         . famotidine (PEPCID) 20 MG tablet   Oral   Take 20 mg by mouth daily.         . fentaNYL (DURAGESIC - DOSED MCG/HR) 25 MCG/HR   Transdermal  Place 1 patch onto the skin every 3 (three) days.         . fish oil-omega-3 fatty acids 1000 MG capsule   Oral   Take 1 g by mouth daily.         . fluticasone (FLONASE) 50 MCG/ACT nasal spray   Nasal   Place 2 sprays into the nose daily.   16 g   0   . guaiFENesin (MUCINEX) 600 MG 12 hr tablet   Oral   Take 2 tablets (1,200 mg total) by mouth 2 (two) times daily.   28 tablet   0   . lamoTRIgine (LAMICTAL) 100 MG tablet   Oral   Take 2 tablets (200 mg total) by mouth 2 (two) times daily.   120 tablet   10   . levocetirizine (XYZAL) 5 MG tablet   Oral   Take 5 mg by mouth every evening.         Marland Kitchen LORazepam (ATIVAN) 2 MG tablet   Oral   Take 1 tablet (2 mg total) by mouth 3 (three) times daily.   90 tablet   2   . mirabegron ER (MYRBETRIQ) 25 MG TB24   Oral   Take 25 mg by mouth daily.         Marland Kitchen OLANZapine (ZYPREXA) 10 MG tablet   Oral   Take 1 tablet (10 mg total) by mouth at bedtime.   30 tablet   5   . ondansetron (ZOFRAN) 8 MG tablet   Oral   Take by mouth every 8 (eight) hours as needed.  For nausea         . oxybutynin (DITROPAN XL) 15 MG 24 hr tablet   Oral   Take 15 mg by mouth 2 (two) times daily.         Marland Kitchen oxyCODONE-acetaminophen (PERCOCET) 10-325 MG per tablet   Oral   Take 1 tablet by mouth every 4 (four) hours as needed. For pain         . SUMAtriptan (IMITREX) 100 MG tablet   Oral   Take 100 mg by mouth every 2 (two) hours as needed. MIGRAINE         . SUMAtriptan (IMITREX) 50 MG tablet   Oral   Take 1 tablet (50 mg total) by mouth every 2 (two) hours as needed for migraine.   10 tablet   0   . temazepam (RESTORIL) 15 MG capsule   Oral   Take 30 mg by mouth at bedtime as needed.         . topiramate (TOPAMAX) 100 MG tablet   Oral   Take 100 mg by mouth 4 (four) times daily.            LMP 07/26/2012  Physical Exam  Nursing note and vitals reviewed. Constitutional: She appears well-developed and well-nourished. She appears lethargic.  Non-toxic appearance. No distress.  HENT:  Head: Normocephalic and atraumatic.  Eyes: Conjunctivae, EOM and lids are normal. Pupils are equal, round, and reactive to light.  Neck: Normal range of motion. Neck supple. No tracheal deviation present. No mass present.  Cardiovascular: Normal rate, regular rhythm and normal heart sounds.  Exam reveals no gallop.   No murmur heard. Pulmonary/Chest: Effort normal and breath sounds normal. No stridor. No respiratory distress. She has no decreased breath sounds. She has no wheezes. She has no rhonchi. She has no rales.  Abdominal: Soft. Normal appearance and bowel sounds are normal. She exhibits no  distension. There is no tenderness. There is no rebound and no CVA tenderness.  Musculoskeletal: Normal range of motion. She exhibits no edema and no tenderness.  Neurological: She has normal strength. She appears lethargic. No cranial nerve deficit or sensory deficit. GCS eye subscore is 3. GCS verbal subscore is 4. GCS motor subscore is 6.  Patient able to move all 4  extremity his. She is compatible but directable. She is able to protect her airway at this time  Skin: Skin is warm and dry. No abrasion and no rash noted.  Psychiatric: Her affect is inappropriate. Her speech is delayed. She is slowed.    ED Course  Procedures (including critical care time)  Labs Reviewed  ETHANOL  URINE RAPID DRUG SCREEN (HOSP PERFORMED)  SALICYLATE LEVEL  ACETAMINOPHEN LEVEL  CBC WITH DIFFERENTIAL  COMPREHENSIVE METABOLIC PANEL  CK  URINALYSIS, ROUTINE W REFLEX MICROSCOPIC  BLOOD GAS, ARTERIAL   No results found.   No diagnosis found.    MDM  Patient was able to protect her airway on arrival. She has been combative but does seem to follow commands. Blood gas is noted she is acidotic. She has been reassessed it continues to become more arousable. We'll continue to follow  11:39 PM Patient rechecked multiple times and is able to protect her airway. Currently fluids are given for her mild acidosis. Patient is combative and has required 4 point restraints for her safety. She was admitted to the step down unit for observation  CRITICAL CARE Performed by: Toy Baker   Total critical care time: 75  Critical care time was exclusive of separately billable procedures and treating other patients.  Critical care was necessary to treat or prevent imminent or life-threatening deterioration.  Critical care was time spent personally by me on the following activities: development of treatment plan with patient and/or surrogate as well as nursing, discussions with consultants, evaluation of patient's response to treatment, examination of patient, obtaining history from patient or surrogate, ordering and performing treatments and interventions, ordering and review of laboratory studies, ordering and review of radiographic studies, pulse oximetry and re-evaluation of patient's condition.       Toy Baker, MD 08/30/12 [phone]

## 2012-08-30 NOTE — ED Notes (Signed)
ZOX:WRUE<AV> Expected date:<BR> Expected time:<BR> Means of arrival:<BR> Comments:<BR> EMS/overdose with RR 6

## 2012-08-31 DIAGNOSIS — T6592XA Toxic effect of unspecified substance, intentional self-harm, initial encounter: Secondary | ICD-10-CM

## 2012-08-31 DIAGNOSIS — F3130 Bipolar disorder, current episode depressed, mild or moderate severity, unspecified: Secondary | ICD-10-CM

## 2012-08-31 DIAGNOSIS — G9341 Metabolic encephalopathy: Secondary | ICD-10-CM

## 2012-08-31 DIAGNOSIS — I959 Hypotension, unspecified: Secondary | ICD-10-CM

## 2012-08-31 DIAGNOSIS — E872 Acidosis: Secondary | ICD-10-CM | POA: Diagnosis present

## 2012-08-31 DIAGNOSIS — T1491XA Suicide attempt, initial encounter: Secondary | ICD-10-CM | POA: Diagnosis present

## 2012-08-31 DIAGNOSIS — F314 Bipolar disorder, current episode depressed, severe, without psychotic features: Secondary | ICD-10-CM | POA: Diagnosis present

## 2012-08-31 DIAGNOSIS — X838XXA Intentional self-harm by other specified means, initial encounter: Secondary | ICD-10-CM

## 2012-08-31 DIAGNOSIS — N3010 Interstitial cystitis (chronic) without hematuria: Secondary | ICD-10-CM

## 2012-08-31 DIAGNOSIS — T50901A Poisoning by unspecified drugs, medicaments and biological substances, accidental (unintentional), initial encounter: Principal | ICD-10-CM

## 2012-08-31 LAB — LACTIC ACID, PLASMA: Lactic Acid, Venous: 1 mmol/L (ref 0.5–2.2)

## 2012-08-31 LAB — RAPID URINE DRUG SCREEN, HOSP PERFORMED
Amphetamines: NOT DETECTED
Barbiturates: NOT DETECTED
Benzodiazepines: POSITIVE — AB
Cocaine: NOT DETECTED
Opiates: NOT DETECTED
Tetrahydrocannabinol: NOT DETECTED

## 2012-08-31 LAB — BASIC METABOLIC PANEL
BUN: 12 mg/dL (ref 6–23)
CO2: 18 mEq/L — ABNORMAL LOW (ref 19–32)
Calcium: 8.6 mg/dL (ref 8.4–10.5)
Chloride: 116 mEq/L — ABNORMAL HIGH (ref 96–112)
Creatinine, Ser: 0.63 mg/dL (ref 0.50–1.10)
GFR calc Af Amer: >90 mL/min (ref >90)
GFR calc non Af Amer: >90 mL/min (ref >90)
Glucose, Bld: 117 mg/dL — ABNORMAL HIGH (ref 70–99)
Potassium: 3.1 mEq/L — ABNORMAL LOW (ref 3.5–5.1)
Sodium: 144 mEq/L (ref 135–145)

## 2012-08-31 LAB — CBC
HCT: 36.5 % (ref 36.0–46.0)
Hemoglobin: 12.6 g/dL (ref 12.0–15.0)
MCH: 30.1 pg (ref 26.0–34.0)
MCHC: 34.5 g/dL (ref 30.0–36.0)
MCV: 87.3 fL (ref 78.0–100.0)
Platelets: 229 10*3/uL (ref 150–400)
RBC: 4.18 MIL/uL (ref 3.87–5.11)
RDW: 12.4 % (ref 11.5–15.5)
WBC: 10.8 10*3/uL — ABNORMAL HIGH (ref 4.0–10.5)

## 2012-08-31 LAB — MRSA PCR SCREENING: MRSA by PCR: NEGATIVE

## 2012-08-31 LAB — PHOSPHORUS: Phosphorus: 1.9 mg/dL — ABNORMAL LOW (ref 2.3–4.6)

## 2012-08-31 LAB — MAGNESIUM: Magnesium: 1.8 mg/dL (ref 1.5–2.5)

## 2012-08-31 MED ORDER — LACTATED RINGERS IV BOLUS (SEPSIS)
1000.0000 mL | Freq: Once | INTRAVENOUS | Status: AC
  Administered 2012-08-31: 1000 mL via INTRAVENOUS

## 2012-08-31 MED ORDER — LEVOFLOXACIN IN D5W 500 MG/100ML IV SOLN
500.0000 mg | Freq: Every day | INTRAVENOUS | Status: DC
  Administered 2012-08-31 – 2012-09-01 (×3): 500 mg via INTRAVENOUS
  Filled 2012-08-31 (×4): qty 100

## 2012-08-31 MED ORDER — NALOXONE HCL 0.4 MG/ML IJ SOLN: 0.4000 mg | INTRAMUSCULAR | Status: DC | PRN

## 2012-08-31 MED ORDER — ONDANSETRON HCL 4 MG/2ML IJ SOLN: 4.0000 mg | Freq: Four times a day (QID) | INTRAMUSCULAR | Status: DC | PRN

## 2012-08-31 MED ORDER — VANCOMYCIN HCL IN DEXTROSE 1-5 GM/200ML-% IV SOLN
1000.0000 mg | Freq: Two times a day (BID) | INTRAVENOUS | Status: DC
  Administered 2012-08-31 (×2): 1000 mg via INTRAVENOUS
  Filled 2012-08-31 (×3): qty 200

## 2012-08-31 MED ORDER — ONDANSETRON HCL 4 MG PO TABS
4.0000 mg | ORAL_TABLET | Freq: Four times a day (QID) | ORAL | Status: DC | PRN
  Administered 2012-09-02: 4 mg via ORAL
  Filled 2012-08-31: qty 1

## 2012-08-31 MED ORDER — VANCOMYCIN HCL IN DEXTROSE 1-5 GM/200ML-% IV SOLN
1000.0000 mg | Freq: Once | INTRAVENOUS | Status: AC
  Administered 2012-08-31: 1000 mg via INTRAVENOUS
  Filled 2012-08-31: qty 200

## 2012-08-31 MED ORDER — SODIUM CHLORIDE 0.9 % IJ SOLN
3.0000 mL | Freq: Two times a day (BID) | INTRAMUSCULAR | Status: DC
  Administered 2012-08-31 (×3): 3 mL via INTRAVENOUS

## 2012-08-31 MED ORDER — POTASSIUM CHLORIDE CRYS ER 20 MEQ PO TBCR
40.0000 meq | EXTENDED_RELEASE_TABLET | ORAL | Status: AC
  Administered 2012-08-31 (×2): 40 meq via ORAL
  Filled 2012-08-31 (×2): qty 2

## 2012-08-31 MED ORDER — DEXTROSE IN LACTATED RINGERS 5 % IV SOLN
INTRAVENOUS | Status: DC
  Administered 2012-08-31: 03:00:00 via INTRAVENOUS

## 2012-08-31 NOTE — Progress Notes (Signed)
Nutrition Brief Note  Patient identified for nutrition screening due to low Braden score  Body mass index is 26.51 kg/(m^2). Patient meets criteria for Overweight based on current BMI.   Current diet order is Clear Liquids, patient is consuming approximately 100% of meals at this time. Labs and medications reviewed. Pt reports having a good appetite and was eating well at home PTA. Pt states she eats a lot of junk food. Her usual body weight is 150 to 155 lbs per pt; pt has been trying to lose weight with help from her doctor. Pt has no questions or concerns at this time.   No nutrition interventions warranted at this time. If nutrition issues arise, please consult RD.   Ian Malkin RD, LDN Inpatient Clinical Dietitian Pager: [phone] After Hours Pager: [phone]

## 2012-08-31 NOTE — H&P (Signed)
[facility] History and Physical  Kemesha L Kitchings ZOX:[id number] DOB: Jun 06, 1981 DOA: 08/30/2012  Referring physician: Dr. Freida Busman PCP: Mady Gemma, PA  Specialists: none  Chief Complaint: Altered mental status, found unresponsive  HPI: [name] is a 32 y.o. female  With history of Bipolar disorder, chronic cystitis (unknown per discussion with mother), and chronic lower back pain.  Presents to the ED 2ary to Altered mental status.  History is obtained from mother who is at bedside.  She reports that patient asked mother to refill one of her medications (topamax) and when she returned to the house she found patient lying on floor unresponsive.  Patient uses fentanyl patch at home for chronic pain.  Reportedly in field was given narcan by EMS and patient reportedly was more responsive.  We were consulted for further admission and evaluation 2ary to persistent AMS and abnormal ABG.  Review of Systems: Unable to properly assess due to altered mental status and limited patient cooperation  Past Medical History  Diagnosis Date  . Bipolar 1 disorder   . Migraine   . Mental disorder   . Anxiety   . Interstitial cystitis   . Galactorrhea   . Chronic back pain   . Seasonal allergies   . Sinusitis    Past Surgical History  Procedure Laterality Date  . Tubal ligation    . Colon surgery    . Interstim implant placement    . Bowel resection  1998    prolapsed bowel  . Foot surgery      bunionectomy bil. pins put in and taken out  . Bladder surgery      distenstion numerous times   Social History:  reports that she has never smoked. She has never used smokeless tobacco. She reports that she does not drink alcohol or use illicit drugs.  where does patient live--home, ALF, SNF? and with whom if at home? At home with mother  Can patient participate in ADLs? Yes prior to current condition  Allergies  Allergen Reactions  . Augmentin (Amoxicillin-Pot Clavulanate) Nausea And  Vomiting  . Ciprofloxacin Nausea Only  . Erythromycin Nausea And Vomiting    severe  . Gabapentin Other (See Comments)    SI INTENTIONS  . Other Other (See Comments)    Medication:Urised Reaction:"hurts bladder"  . Prozac (Fluoxetine Hcl)     SI    Family History  Problem Relation Age of Onset  . Hypertension Mother   . Esophageal cancer Maternal Grandmother   . Diabetes Maternal Grandfather   . Stroke Maternal Grandfather   . Depression Maternal Grandfather   . Anxiety disorder Maternal Grandfather   . Heart disease Other    unable to properly assess due to AMS  Prior to Admission medications   Medication Sig Start Date End Date Taking? Authorizing Provider  azelastine (ASTELIN) 137 MCG/SPRAY nasal spray Place 1 spray into the nose 2 (two) times daily. Use in each nostril as directed   Yes Historical Provider, MD  benzonatate (TESSALON) 100 MG capsule Take 100 mg by mouth 3 (three) times daily as needed for cough.   Yes Historical Provider, MD  desipramine (NORPRAMIN) 25 MG tablet Take 25 mg by mouth every morning.    Yes Historical Provider, MD  diclofenac sodium (VOLTAREN) 1 % GEL Apply 2 g topically 4 (four) times daily.    Yes Historical Provider, MD  doxazosin (CARDURA) 4 MG tablet Take 4 mg by mouth at bedtime.   Yes Historical Provider, MD  famotidine (  PEPCID) 20 MG tablet Take 20 mg by mouth daily.   Yes Historical Provider, MD  fentaNYL (DURAGESIC - DOSED MCG/HR) 25 MCG/HR Place 1 patch onto the skin every 3 (three) days.   Yes Historical Provider, MD  fish oil-omega-3 fatty acids 1000 MG capsule Take 1 g by mouth every morning.    Yes Historical Provider, MD  fluticasone (FLONASE) 50 MCG/ACT nasal spray Place 2 sprays into the nose daily. 03/19/12 03/19/13 Yes Luiz Blare, MD  lamoTRIgine (LAMICTAL) 100 MG tablet Take 2 tablets (200 mg total) by mouth 2 (two) times daily. 06/28/12  Yes Archer Asa, MD  levocetirizine (XYZAL) 5 MG tablet Take 5 mg by mouth every  evening.   Yes Historical Provider, MD  LORazepam (ATIVAN) 2 MG tablet Take 1 tablet (2 mg total) by mouth 3 (three) times daily. 06/28/12  Yes Archer Asa, MD  OLANZapine (ZYPREXA) 10 MG tablet Take 1 tablet (10 mg total) by mouth at bedtime. 04/28/12  Yes Archer Asa, MD  ondansetron (ZOFRAN) 8 MG tablet Take by mouth every 8 (eight) hours as needed. For nausea   Yes Historical Provider, MD  oxybutynin (DITROPAN XL) 15 MG 24 hr tablet Take 15 mg by mouth 2 (two) times daily.   Yes Historical Provider, MD  promethazine (PHENERGAN) 50 MG tablet Take 50 mg by mouth every 8 (eight) hours as needed for nausea.  06/20/12  Yes Historical Provider, MD  SUMAtriptan (IMITREX) 100 MG tablet Take 100 mg by mouth every 2 (two) hours as needed. MIGRAINE   Yes Historical Provider, MD  temazepam (RESTORIL) 30 MG capsule Take 60 mg by mouth at bedtime.   Yes Historical Provider, MD  topiramate (TOPAMAX) 100 MG tablet Take 100-200 mg by mouth 3 (three) times daily. 2 tablets in the AM 1 tablet in the afternoon and 1 tablet at bedtime   Yes Historical Provider, MD  Zolpidem Tartrate (INTERMEZZO) 3.5 MG SUBL Place 3.5 mg under the tongue at bedtime. sleep   Yes Historical Provider, MD   Physical Exam: Filed Vitals:   08/30/12 2038 08/30/12 2339  BP: 103/78 116/73  Pulse: 124 97  Temp:  98.6 F (37 C)  TempSrc:  Oral  Resp: 24 22  SpO2: 100% 99%     General:  Pt laying in bed with eyes closed.  Limited interaction with examiner and asks random questions which do not make sense or are congruent with conveRSATION  Eyes: pin point pupils, non icteric  ENT: normal exterior appearance, no masses on visual examination  Neck: supple, no goiter  Cardiovascular: RRR, No MRG  Respiratory: CTA BL, no wheezes  Abdomen: soft, non tender, ND  Skin: warm and dry  Musculoskeletal: no cyanosis or clubbing  Psychiatric: unable to properly assess due to altered mental status with limited cooperation with  examiner.  Neurologic: Patient speaks out loud spontaneously, moves all extremities, limited cooperation with examiner  Labs on Admission:  Basic Metabolic Panel:  Recent Labs Lab 08/30/12 2035  NA 136  K 4.8  CL 101  CO2 17*  GLUCOSE 122*  BUN 21  CREATININE 0.93  CALCIUM 9.5   Liver Function Tests:  Recent Labs Lab 08/30/12 2035  AST 38*  ALT 22  ALKPHOS 60  BILITOT 0.3  PROT 7.9  ALBUMIN 4.6   No results found for this basename: LIPASE, AMYLASE,  in the last 168 hours No results found for this basename: AMMONIA,  in the last 168 hours CBC:  Recent Labs Lab  08/30/12 2035  WBC 14.6*  NEUTROABS 12.0*  HGB 13.8  HCT 39.7  MCV 87.4  PLT 253   Cardiac Enzymes:  Recent Labs Lab 08/30/12 2035  CKTOTAL 222*    BNP (last 3 results) No results found for this basename: PROBNP,  in the last 8760 hours CBG: No results found for this basename: GLUCAP,  in the last 168 hours  Radiological Exams on Admission: Dg Chest 1 View  08/30/2012  *RADIOLOGY REPORT*  Clinical Data: Altered mental status  CHEST - 1 VIEW  Comparison: 02/29/2012  Findings: Hypoaeration with mild hemidiaphragm elevation.  Lungs clear.  No pleural effusion or pneumothorax.  No acute osseous finding.  IMPRESSION: Hypoaeration without radiographic evidence of acute cardiopulmonary process.   Original Report Authenticated By: Jearld Lesch, M.D.    Ct Head Wo Contrast  08/30/2012  *RADIOLOGY REPORT*  Clinical Data:  UNRESPONSIVE, OVERDOSE.  CT HEAD WITHOUT CONTRAST CT MAXILLOFACIAL WITHOUT CONTRAST CT CERVICAL SPINE WITHOUT CONTRAST  Technique:  Multidetector CT imaging of the head, cervical spine, and maxillofacial structures were performed using the standard protocol without intravenous contrast. Multiplanar CT image reconstructions of the cervical spine and maxillofacial structures were also generated.  Comparison:  05/27/2009 head CT  CT HEAD  Findings: There is no evidence for acute hemorrhage,  hydrocephalus, mass lesion, or abnormal extra-axial fluid collection.  No definite CT evidence for acute infarction.  No displaced calvarial fracture. Mastoid air cells are clear.  IMPRESSION: No acute intracranial abnormality.  CT MAXILLOFACIAL  Findings:  Globes are symmetric.  Lenses located.  No retrobulbar hematoma.  Orbital walls and sinus walls are intact.  Intact nasal bones and nasal septum.  Intact pterygoid plates and zygomatic arches.  Located temporomandibular joints.  Poor dentition.  No mandible fracture.  IMPRESSION: No acute maxillofacial bone fracture.  CT CERVICAL SPINE  Findings:   Maintained craniocervical relationship.  No dens fracture.  Maintained vertebral body height alignment. Prevertebral soft tissues are within normal limits.  IMPRESSION: No acute osseous abnormality of the cervical spine.   Original Report Authenticated By: Jearld Lesch, M.D.    Ct Cervical Spine Wo Contrast  08/30/2012  *RADIOLOGY REPORT*  Clinical Data:  UNRESPONSIVE, OVERDOSE.  CT HEAD WITHOUT CONTRAST CT MAXILLOFACIAL WITHOUT CONTRAST CT CERVICAL SPINE WITHOUT CONTRAST  Technique:  Multidetector CT imaging of the head, cervical spine, and maxillofacial structures were performed using the standard protocol without intravenous contrast. Multiplanar CT image reconstructions of the cervical spine and maxillofacial structures were also generated.  Comparison:  05/27/2009 head CT  CT HEAD  Findings: There is no evidence for acute hemorrhage, hydrocephalus, mass lesion, or abnormal extra-axial fluid collection.  No definite CT evidence for acute infarction.  No displaced calvarial fracture. Mastoid air cells are clear.  IMPRESSION: No acute intracranial abnormality.  CT MAXILLOFACIAL  Findings:  Globes are symmetric.  Lenses located.  No retrobulbar hematoma.  Orbital walls and sinus walls are intact.  Intact nasal bones and nasal septum.  Intact pterygoid plates and zygomatic arches.  Located temporomandibular  joints.  Poor dentition.  No mandible fracture.  IMPRESSION: No acute maxillofacial bone fracture.  CT CERVICAL SPINE  Findings:   Maintained craniocervical relationship.  No dens fracture.  Maintained vertebral body height alignment. Prevertebral soft tissues are within normal limits.  IMPRESSION: No acute osseous abnormality of the cervical spine.   Original Report Authenticated By: Jearld Lesch, M.D.    Ct Maxillofacial Wo Cm  08/30/2012  *RADIOLOGY REPORT*  Clinical Data:  UNRESPONSIVE, OVERDOSE.  CT HEAD WITHOUT CONTRAST CT MAXILLOFACIAL WITHOUT CONTRAST CT CERVICAL SPINE WITHOUT CONTRAST  Technique:  Multidetector CT imaging of the head, cervical spine, and maxillofacial structures were performed using the standard protocol without intravenous contrast. Multiplanar CT image reconstructions of the cervical spine and maxillofacial structures were also generated.  Comparison:  05/27/2009 head CT  CT HEAD  Findings: There is no evidence for acute hemorrhage, hydrocephalus, mass lesion, or abnormal extra-axial fluid collection.  No definite CT evidence for acute infarction.  No displaced calvarial fracture. Mastoid air cells are clear.  IMPRESSION: No acute intracranial abnormality.  CT MAXILLOFACIAL  Findings:  Globes are symmetric.  Lenses located.  No retrobulbar hematoma.  Orbital walls and sinus walls are intact.  Intact nasal bones and nasal septum.  Intact pterygoid plates and zygomatic arches.  Located temporomandibular joints.  Poor dentition.  No mandible fracture.  IMPRESSION: No acute maxillofacial bone fracture.  CT CERVICAL SPINE  Findings:   Maintained craniocervical relationship.  No dens fracture.  Maintained vertebral body height alignment. Prevertebral soft tissues are within normal limits.  IMPRESSION: No acute osseous abnormality of the cervical spine.   Original Report Authenticated By: Jearld Lesch, M.D.     EKG: Independently reviewed. Sinus  tachycardia  Assessment/Plan Active Problems: Metabolic encephalopathy Metabolic acidosis with respiratory compensation   PAIN, CHRONIC NEC   INTERSTITIAL CYSTITIS   UTI   Bipolar I disorder with mania   1. Metabolic acidosis with respiratory compensation - ? Etiology, Anion gap of 18.  Creatinine within normal limits, Glucose only mildly elevated.  No lactic acid level and U/A shows cloudy urine with positive nitrite small leukocytes and rare bacteria.  At this point will place on IV broad spectrum antibiotics, urine culture, blood culture, and stat lactic acid level - transfer to stepdown for closer monitoring - place on lactated ringers solution  2. Metabolic encephalopathy - Most likely related to # 1 although per ED physician report patient had improvement in respiratory rate and mentation once fentanyl patch was removed and narcan was administered. - place order for narcan prn  3. For other medical condition will hold medications given # 1    Code Status: full code Family Communication: discussed case with mother Disposition Plan: will transfer to stepdown unit for closer monitoring.  If condition deteriorates will consult PCCM for further evaluation and recommendations  Time spent: > 65 minutes  Penny Pia [facility] Pager [phone]  If 7PM-7AM, please contact night-coverage [email or web address] Password [facility] 08/31/2012, 12:26 AM

## 2012-08-31 NOTE — Progress Notes (Addendum)
ANTIBIOTIC CONSULT NOTE - INITIAL  Pharmacy Consult for vancomycin/levofloxacin Indication: rule out sepsis  Allergies  Allergen Reactions  . Augmentin (Amoxicillin-Pot Clavulanate) Nausea And Vomiting  . Ciprofloxacin Nausea Only  . Erythromycin Nausea And Vomiting    severe  . Gabapentin Other (See Comments)    SI INTENTIONS  . Other Other (See Comments)    Medication:Urised Reaction:"hurts bladder"  . Prozac (Fluoxetine Hcl)     SI    Patient Measurements: Height: 5\' 1"  (154.9 cm) Weight: 140 lb 3.4 oz (63.6 kg) IBW/kg (Calculated) : 47.8 Adjusted Body Weight:   Vital Signs: Temp: 98.3 F (36.8 C) (02/20 0230) Temp src: Axillary (02/20 0230) BP: 94/55 mmHg (02/20 0400) Pulse Rate: 95 (02/20 0400) Intake/Output from previous day: 02/19 0701 - 02/20 0700 In: 591.7 [I.V.:291.7; IV Piggyback:300] Out: 1620 [Urine:1620] Intake/Output from this shift: Total I/O In: 591.7 [I.V.:291.7; IV Piggyback:300] Out: 1620 [Urine:1620]  Labs:  Recent Labs  08/30/12 2035 08/31/12 0510  WBC 14.6* 10.8*  HGB 13.8 12.6  PLT 253 229  CREATININE 0.93 0.63   Estimated Creatinine Clearance: 85.4 ml/min (by C-G formula based on Cr of 0.63). No results found for this basename: VANCOTROUGH, Leodis Binet, VANCORANDOM, GENTTROUGH, GENTPEAK, GENTRANDOM, TOBRATROUGH, TOBRAPEAK, TOBRARND, AMIKACINPEAK, AMIKACINTROU, AMIKACIN,  in the last 72 hours   Microbiology: Recent Results (from the past 720 hour(s))  MRSA PCR SCREENING     Status: None   Collection Time    08/31/12  2:52 AM      Result Value Range Status   MRSA by PCR NEGATIVE  NEGATIVE Final   Comment:            The GeneXpert MRSA Assay (FDA     approved for NASAL specimens     only), is one component of a     comprehensive MRSA colonization     surveillance program. It is not     intended to diagnose MRSA     infection nor to guide or     monitor treatment for     MRSA infections.    Medical History: Past Medical  History  Diagnosis Date  . Bipolar 1 disorder   . Migraine   . Mental disorder   . Anxiety   . Interstitial cystitis   . Galactorrhea   . Chronic back pain   . Seasonal allergies   . Sinusitis     Medications:  Anti-infectives   Start     Dose/Rate Route Frequency Ordered Stop   08/31/12 1200  vancomycin (VANCOCIN) IVPB 1000 mg/200 mL premix     1,000 mg 200 mL/hr over 60 Minutes Intravenous Every 12 hours 08/31/12 0644     08/31/12 0300  vancomycin (VANCOCIN) IVPB 1000 mg/200 mL premix     1,000 mg 200 mL/hr over 60 Minutes Intravenous  Once 08/31/12 0246 08/31/12 0422   08/31/12 0300  levofloxacin (LEVAQUIN) IVPB 500 mg     500 mg 100 mL/hr over 60 Minutes Intravenous Daily at bedtime 08/31/12 0246       Assessment: Patient with UTI, t/o sepsis.  First dose of antibiotics already given in ED Levofloxacin dosed based on patient weight and renal function  Goal of Therapy:  Vancomycin trough level 15-20 mcg/ml        Plan:  Vancomycin 1gm iv q12hr Levofloxacin 500mg  iv q24hr   Aleene Davidson Crowford 08/31/2012,6:46 AM

## 2012-08-31 NOTE — Progress Notes (Signed)
[facility] PROGRESS NOTE  [name] UJW:[id number] DOB: 02-21-1981 DOA: 08/30/2012 PCP: Mady Gemma, PA  Assessment/Plan: 1.Suicide attempt/drug overdose -Patient this a.m. admit to taking drug overdose -states she took a 'whole lot pills' but unable to remember this time the names, intentionally and admits to suicidal ideations -Suicide precautions-one-on-one sitter -consult psychiatry for further recommendations 2.Altered mental status/acute encephalopathy -Likely secondary to #1 3? Urinary tract infection/interstitial cystitis -Urinalysis not very impressive for a UTI, she has a history of interstitial cystitis -We'll continue current antibiotics pending urine cultures 4. Hypotension -More likely secondary to #1, cannot rule out possible infectious etiology secondary to urinary source-will continue antibiotics as above pending urine cultures -IV fluids, follow continue monitoring  today. 5. Metabolic acidosis -Likely secondary to #1, lactic acid level within normal limits, follow. Improving with supportive care, follow. 6. bipolar disorder -psych consult as above 7. Hypokalemia-replace potassium  Code Status: full Family Communication:pt alone at bedside Disposition Plan: pending psych eval, continue monitoring in ICU for today   Consultants:  Psychiatry consulted  Procedures:  none  Antibiotics:  Vancomycin and Levaquin started 1/20  HPI/Subjective: Patient somnolent but arousable, oriented x3. She admits to taking awhole lot of pills yesterday because she wanted to kill herself. She denies chest pain, no shortness of breath.  Objective: Filed Vitals:   08/31/12 0400 08/31/12 0700 08/31/12 0800 08/31/12 0900  BP: 94/55  118/78   Pulse: 95 85 88 84  Temp:      TempSrc:      Resp: 14 15 14 17   Height:      Weight:      SpO2: 100% 100% 100% 100%    Intake/Output Summary (Last 24 hours) at 08/31/12 1022 Last data filed at 08/31/12 0900  Gross  per 24 hour  Intake 891.67 ml  Output   1620 ml  Net -728.33 ml   Filed Weights   08/31/12 0230  Weight: 63.6 kg (140 lb 3.4 oz)    Exam: Gen.-Young female, somnolent but arousable in no apparent distress Cardiovascular-regular rate and rhythm, S1-S2 Lungs-clear to auscultation bilaterally no crackles or wheezes Abdomen-soft, bowel sounds present nontender nondistended no organomegaly no masses palpable Extremities: No cyanosis and no edema .Data Reviewed: Basic Metabolic Panel:  Recent Labs Lab 08/30/12 2035 08/31/12 0510  NA 136 144  K 4.8 3.1*  CL 101 116*  CO2 17* 18*  GLUCOSE 122* 117*  BUN 21 12  CREATININE 0.93 0.63  CALCIUM 9.5 8.6  MG  --  1.8  PHOS  --  1.9*   Liver Function Tests:  Recent Labs Lab 08/30/12 2035  AST 38*  ALT 22  ALKPHOS 60  BILITOT 0.3  PROT 7.9  ALBUMIN 4.6   No results found for this basename: LIPASE, AMYLASE,  in the last 168 hours No results found for this basename: AMMONIA,  in the last 168 hours CBC:  Recent Labs Lab 08/30/12 2035 08/31/12 0510  WBC 14.6* 10.8*  NEUTROABS 12.0*  --   HGB 13.8 12.6  HCT 39.7 36.5  MCV 87.4 87.3  PLT 253 229   Cardiac Enzymes:  Recent Labs Lab 08/30/12 2035  CKTOTAL 222*   BNP (last 3 results) No results found for this basename: PROBNP,  in the last 8760 hours CBG: No results found for this basename: GLUCAP,  in the last 168 hours  Recent Results (from the past 240 hour(s))  MRSA PCR SCREENING     Status: None   Collection Time  08/31/12  2:52 AM      Result Value Range Status   MRSA by PCR NEGATIVE  NEGATIVE Final   Comment:            The GeneXpert MRSA Assay (FDA     approved for NASAL specimens     only), is one component of a     comprehensive MRSA colonization     surveillance program. It is not     intended to diagnose MRSA     infection nor to guide or     monitor treatment for     MRSA infections.     Studies: Dg Chest 1 View  08/30/2012  *RADIOLOGY  REPORT*  Clinical Data: Altered mental status  CHEST - 1 VIEW  Comparison: 02/29/2012  Findings: Hypoaeration with mild hemidiaphragm elevation.  Lungs clear.  No pleural effusion or pneumothorax.  No acute osseous finding.  IMPRESSION: Hypoaeration without radiographic evidence of acute cardiopulmonary process.   Original Report Authenticated By: Jearld Lesch, M.D.    Ct Head Wo Contrast  08/30/2012  *RADIOLOGY REPORT*  Clinical Data:  UNRESPONSIVE, OVERDOSE.  CT HEAD WITHOUT CONTRAST CT MAXILLOFACIAL WITHOUT CONTRAST CT CERVICAL SPINE WITHOUT CONTRAST  Technique:  Multidetector CT imaging of the head, cervical spine, and maxillofacial structures were performed using the standard protocol without intravenous contrast. Multiplanar CT image reconstructions of the cervical spine and maxillofacial structures were also generated.  Comparison:  05/27/2009 head CT  CT HEAD  Findings: There is no evidence for acute hemorrhage, hydrocephalus, mass lesion, or abnormal extra-axial fluid collection.  No definite CT evidence for acute infarction.  No displaced calvarial fracture. Mastoid air cells are clear.  IMPRESSION: No acute intracranial abnormality.  CT MAXILLOFACIAL  Findings:  Globes are symmetric.  Lenses located.  No retrobulbar hematoma.  Orbital walls and sinus walls are intact.  Intact nasal bones and nasal septum.  Intact pterygoid plates and zygomatic arches.  Located temporomandibular joints.  Poor dentition.  No mandible fracture.  IMPRESSION: No acute maxillofacial bone fracture.  CT CERVICAL SPINE  Findings:   Maintained craniocervical relationship.  No dens fracture.  Maintained vertebral body height alignment. Prevertebral soft tissues are within normal limits.  IMPRESSION: No acute osseous abnormality of the cervical spine.   Original Report Authenticated By: Jearld Lesch, M.D.    Ct Cervical Spine Wo Contrast  08/30/2012  *RADIOLOGY REPORT*  Clinical Data:  UNRESPONSIVE, OVERDOSE.  CT HEAD  WITHOUT CONTRAST CT MAXILLOFACIAL WITHOUT CONTRAST CT CERVICAL SPINE WITHOUT CONTRAST  Technique:  Multidetector CT imaging of the head, cervical spine, and maxillofacial structures were performed using the standard protocol without intravenous contrast. Multiplanar CT image reconstructions of the cervical spine and maxillofacial structures were also generated.  Comparison:  05/27/2009 head CT  CT HEAD  Findings: There is no evidence for acute hemorrhage, hydrocephalus, mass lesion, or abnormal extra-axial fluid collection.  No definite CT evidence for acute infarction.  No displaced calvarial fracture. Mastoid air cells are clear.  IMPRESSION: No acute intracranial abnormality.  CT MAXILLOFACIAL  Findings:  Globes are symmetric.  Lenses located.  No retrobulbar hematoma.  Orbital walls and sinus walls are intact.  Intact nasal bones and nasal septum.  Intact pterygoid plates and zygomatic arches.  Located temporomandibular joints.  Poor dentition.  No mandible fracture.  IMPRESSION: No acute maxillofacial bone fracture.  CT CERVICAL SPINE  Findings:   Maintained craniocervical relationship.  No dens fracture.  Maintained vertebral body height alignment. Prevertebral soft tissues are  within normal limits.  IMPRESSION: No acute osseous abnormality of the cervical spine.   Original Report Authenticated By: Jearld Lesch, M.D.    Ct Maxillofacial Wo Cm  08/30/2012  *RADIOLOGY REPORT*  Clinical Data:  UNRESPONSIVE, OVERDOSE.  CT HEAD WITHOUT CONTRAST CT MAXILLOFACIAL WITHOUT CONTRAST CT CERVICAL SPINE WITHOUT CONTRAST  Technique:  Multidetector CT imaging of the head, cervical spine, and maxillofacial structures were performed using the standard protocol without intravenous contrast. Multiplanar CT image reconstructions of the cervical spine and maxillofacial structures were also generated.  Comparison:  05/27/2009 head CT  CT HEAD  Findings: There is no evidence for acute hemorrhage, hydrocephalus, mass lesion,  or abnormal extra-axial fluid collection.  No definite CT evidence for acute infarction.  No displaced calvarial fracture. Mastoid air cells are clear.  IMPRESSION: No acute intracranial abnormality.  CT MAXILLOFACIAL  Findings:  Globes are symmetric.  Lenses located.  No retrobulbar hematoma.  Orbital walls and sinus walls are intact.  Intact nasal bones and nasal septum.  Intact pterygoid plates and zygomatic arches.  Located temporomandibular joints.  Poor dentition.  No mandible fracture.  IMPRESSION: No acute maxillofacial bone fracture.  CT CERVICAL SPINE  Findings:   Maintained craniocervical relationship.  No dens fracture.  Maintained vertebral body height alignment. Prevertebral soft tissues are within normal limits.  IMPRESSION: No acute osseous abnormality of the cervical spine.   Original Report Authenticated By: Jearld Lesch, M.D.     Scheduled Meds: . levofloxacin (LEVAQUIN) IV  500 mg Intravenous QHS  . sodium chloride  3 mL Intravenous Q12H  . vancomycin  1,000 mg Intravenous Q12H   Continuous Infusions: . sodium chloride Stopped (08/31/12 0056)  . dextrose 5% lactated ringers 100 mL/hr at 08/31/12 8119    Principal Problem:   Metabolic acidosis Active Problems:   PAIN, CHRONIC NEC   INTERSTITIAL CYSTITIS   UTI   Bipolar I disorder with mania   Metabolic encephalopathy    Time spent: 54    Prisca Gearing C  [facility] Pager [phone] If 8PM-8AM, please contact night-coverage at [email or web address], password [facility] 08/31/2012, 10:22 AM  LOS: 1 day

## 2012-08-31 NOTE — ED Notes (Signed)
Report to floor attempted.  Unable to connect with Nurse

## 2012-08-31 NOTE — Progress Notes (Signed)
[id number]/Alaney Witter, RN, BSN, CCM:  CHART REVIEWED AND UPDATED.  Next chart review due on [id number]. NO DISCHARGE NEEDS PRESENT AT THIS TIME. CASE MANAGEMENT [phone] 
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
## 2012-09-01 ENCOUNTER — Telehealth (HOSPITAL_COMMUNITY): Payer: Self-pay | Admitting: *Deleted

## 2012-09-01 DIAGNOSIS — F3110 Bipolar disorder, current episode manic without psychotic features, unspecified: Secondary | ICD-10-CM

## 2012-09-01 LAB — BASIC METABOLIC PANEL
BUN: 5 mg/dL — ABNORMAL LOW (ref 6–23)
BUN: 5 mg/dL — ABNORMAL LOW (ref 6–23)
BUN: 7 mg/dL (ref 6–23)
CO2: 15 mEq/L — ABNORMAL LOW (ref 19–32)
CO2: 18 mEq/L — ABNORMAL LOW (ref 19–32)
CO2: 19 mEq/L (ref 19–32)
Calcium: 8.6 mg/dL (ref 8.4–10.5)
Calcium: 8.9 mg/dL (ref 8.4–10.5)
Calcium: 9.1 mg/dL (ref 8.4–10.5)
Chloride: 114 mEq/L — ABNORMAL HIGH (ref 96–112)
Chloride: 108 mEq/L (ref 96–112)
Chloride: 108 mEq/L (ref 96–112)
Creatinine, Ser: 0.72 mg/dL (ref 0.50–1.10)
Creatinine, Ser: 0.64 mg/dL (ref 0.50–1.10)
Creatinine, Ser: 0.65 mg/dL (ref 0.50–1.10)
GFR calc Af Amer: >90 mL/min (ref >90)
GFR calc Af Amer: >90 mL/min (ref >90)
GFR calc Af Amer: >90 mL/min (ref >90)
GFR calc non Af Amer: >90 mL/min (ref >90)
GFR calc non Af Amer: >90 mL/min (ref >90)
GFR calc non Af Amer: >90 mL/min (ref >90)
Glucose, Bld: 93 mg/dL (ref 70–99)
Glucose, Bld: 75 mg/dL (ref 70–99)
Glucose, Bld: 109 mg/dL — ABNORMAL HIGH (ref 70–99)
Potassium: 3.4 mEq/L — ABNORMAL LOW (ref 3.5–5.1)
Potassium: 3.7 mEq/L (ref 3.5–5.1)
Potassium: 3.1 mEq/L — ABNORMAL LOW (ref 3.5–5.1)
Sodium: 139 mEq/L (ref 135–145)
Sodium: 139 mEq/L (ref 135–145)
Sodium: 138 mEq/L (ref 135–145)

## 2012-09-01 LAB — URINE CULTURE
Colony Count: NO GROWTH
Culture: NO GROWTH

## 2012-09-01 MED ORDER — BACITRACIN-NEOMYCIN-POLYMYXIN 400-5-5000 EX OINT: TOPICAL_OINTMENT | CUTANEOUS | Status: DC | PRN

## 2012-09-01 MED ORDER — STERILE WATER FOR INJECTION IV SOLN
INTRAVENOUS | Status: DC
  Administered 2012-09-01 – 2012-09-02 (×3): via INTRAVENOUS
  Filled 2012-09-01 (×5): qty 9.7

## 2012-09-01 MED ORDER — LORAZEPAM 1 MG PO TABS
1.0000 mg | ORAL_TABLET | Freq: Once | ORAL | Status: AC
  Administered 2012-09-01: 1 mg via ORAL
  Filled 2012-09-01: qty 1

## 2012-09-01 MED ORDER — BISACODYL 5 MG PO TBEC
10.0000 mg | DELAYED_RELEASE_TABLET | Freq: Every day | ORAL | Status: DC | PRN
  Administered 2012-09-01: 10 mg via ORAL
  Filled 2012-09-01: qty 2

## 2012-09-01 MED ORDER — FENTANYL 25 MCG/HR TD PT72
25.0000 ug | MEDICATED_PATCH | TRANSDERMAL | Status: DC
  Administered 2012-09-01: 25 ug via TRANSDERMAL
  Filled 2012-09-01: qty 1

## 2012-09-01 NOTE — Progress Notes (Signed)
Confirmed receipt of Pt's information at [facility] by Fannie Knee.  Fannie Knee stated that it's likely that there will be beds available today.  Pt has yet to be reviewed for appropriateness of admission.  CSW to continue to follow.  Providence Crosby, LCSWA Clinical Social Work [phone]

## 2012-09-01 NOTE — Progress Notes (Signed)
Patient accepted to [facility] accepted dr. Dr. Daleen Bo. CSW spoke with attending MD, who stated that patient would need CO2 levels to be monitored over night. CSW spoke with [facility] who stated that patient can be admitted on Sat. 2/22.   Catha Gosselin, LCSWA  [phone] .09/01/2012 1958pm
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
## 2012-09-01 NOTE — Progress Notes (Signed)
Psych MD contacted CSW requesting that Pt's information be sent to [facility] asap, as psych MD wanting Pt admitted to [facility] asap.  Faxed Pt's information to [facility].  Providence Crosby, LCSWA Clinical Social Work [phone]

## 2012-09-01 NOTE — BH Assessment (Signed)
[facility] Assessment Progress Note  After reviewing pt with Assunta Found, FNP she agreed to accept her to [facility] to the service of Himabindu Ravi, MD, Rm 301-1.  Attempted to call Marchelle Folks, pt's social worker, but call rolled to voice mail.  At 16:48 I reached Baxter Hire, the ED social worker and notified her.  She agreed to complete support paperwork with pt.  Doylene Canning, MA Assessment Counselor 09/01/2012 @ 16:53

## 2012-09-01 NOTE — Progress Notes (Signed)
Patient requesting stool softener or laxative, feels she is needs to have a BM "but can't".  Also requesting Neosporin to put on knee abrasions.  Text page sent to MD.  Will continue to monitor.
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
## 2012-09-01 NOTE — Progress Notes (Addendum)
[facility] PROGRESS NOTE  [name] WUJ:[id number] DOB: April 24, 1981 DOA: 08/30/2012 PCP: Mady Gemma, PA  Assessment/Plan: 1.Suicide attempt/drug overdose -Patient this a.m. admit to taking drug overdose -states she took a 'whole lot pills' but unable to remember this time the names, intentionally and admits to suicidal ideations -Suicide precautions-one-on-one sitter -Discussed with psych and per Dr. Ferol Luz patient will need inpatient treatment, will follow 2.Altered mental status/acute encephalopathy -Likely secondary to #1 3? Urinary tract infection/interstitial cystitis -Urinalysis not very impressive for a UTI, she has a history of interstitial cystitis -We'll DC vancomycin, continue Levaquin for now pending urine cultures. 4. Hypotension -More likely secondary to #1, improving with hydration, cannot rule out possible infectious etiology secondary to urinary source-will continue antibiotics as above pending urine cultures -IV fluids, follow continue monitoring  today. 5. Metabolic acidosis, non-gap -Likely secondary to #1, lactic acid level within normal limits, follow.  -Worsening today, discussed with CCM will place on bicarbonate drip, monitor chemistries and further treat accordingly. 6. bipolar disorder -psych consult as above 7. Hypokalemia-replace potassium  And follow  Code Status: full Family Communication:pt alone at bedside Disposition Plan: pending psych eval, continue monitoring in ICU for today   Consultants:  Psychiatry consulted  Procedures:  none  Antibiotics:  Vancomycin and Levaquin started 1/20  HPI/Subjective: Patient much more alert today and conversant. She again admits to overdosing on Ativan and other pills. She states she was admitted to [facility] in the past at age 32. She denies chest pain no shortness of breath. She is requesting her fentanyl patches which she is on for back pain and interstitial cystitis. No diarrhea  reported  Objective: Filed Vitals:   09/01/12 0600 09/01/12 0700 09/01/12 0800 09/01/12 0900  BP: 104/59 113/75 105/78 121/80  Pulse: 88 88 82 98  Temp:      TempSrc:      Resp: 17 17 16 14   Height:      Weight:      SpO2: 100% 100% 100% 100%    Intake/Output Summary (Last 24 hours) at 09/01/12 9562 Last data filed at 09/01/12 1308  Gross per 24 hour  Intake   3830 ml  Output   4750 ml  Net   -920 ml   Filed Weights   08/31/12 0230  Weight: 63.6 kg (140 lb 3.4 oz)    Exam: Gen.-Young female, somnolent but arousable in no apparent distress Cardiovascular-regular rate and rhythm, S1-S2 Lungs-clear to auscultation bilaterally no crackles or wheezes Abdomen-soft, bowel sounds present nontender nondistended no organomegaly no masses palpable Extremities: No cyanosis and no edema .Data Reviewed: Basic Metabolic Panel:  Recent Labs Lab 08/30/12 2035 08/31/12 0510 09/01/12 0335  NA 136 144 139  K 4.8 3.1* 3.4*  CL 101 116* 114*  CO2 17* 18* 15*  GLUCOSE 122* 117* 93  BUN 21 12 5*  CREATININE 0.93 0.63 0.72  CALCIUM 9.5 8.6 8.6  MG  --  1.8  --   PHOS  --  1.9*  --    Liver Function Tests:  Recent Labs Lab 08/30/12 2035  AST 38*  ALT 22  ALKPHOS 60  BILITOT 0.3  PROT 7.9  ALBUMIN 4.6   No results found for this basename: LIPASE, AMYLASE,  in the last 168 hours No results found for this basename: AMMONIA,  in the last 168 hours CBC:  Recent Labs Lab 08/30/12 2035 08/31/12 0510  WBC 14.6* 10.8*  NEUTROABS 12.0*  --   HGB 13.8 12.6  HCT 39.7  36.5  MCV 87.4 87.3  PLT 253 229   Cardiac Enzymes:  Recent Labs Lab 08/30/12 2035  CKTOTAL 222*   BNP (last 3 results) No results found for this basename: PROBNP,  in the last 8760 hours CBG: No results found for this basename: GLUCAP,  in the last 168 hours  Recent Results (from the past 240 hour(s))  MRSA PCR SCREENING     Status: None   Collection Time    08/31/12  2:52 AM      Result Value  Range Status   MRSA by PCR NEGATIVE  NEGATIVE Final   Comment:            The GeneXpert MRSA Assay (FDA     approved for NASAL specimens     only), is one component of a     comprehensive MRSA colonization     surveillance program. It is not     intended to diagnose MRSA     infection nor to guide or     monitor treatment for     MRSA infections.     Studies: Dg Chest 1 View  08/30/2012  *RADIOLOGY REPORT*  Clinical Data: Altered mental status  CHEST - 1 VIEW  Comparison: 02/29/2012  Findings: Hypoaeration with mild hemidiaphragm elevation.  Lungs clear.  No pleural effusion or pneumothorax.  No acute osseous finding.  IMPRESSION: Hypoaeration without radiographic evidence of acute cardiopulmonary process.   Original Report Authenticated By: Jearld Lesch, M.D.    Ct Head Wo Contrast  08/30/2012  *RADIOLOGY REPORT*  Clinical Data:  UNRESPONSIVE, OVERDOSE.  CT HEAD WITHOUT CONTRAST CT MAXILLOFACIAL WITHOUT CONTRAST CT CERVICAL SPINE WITHOUT CONTRAST  Technique:  Multidetector CT imaging of the head, cervical spine, and maxillofacial structures were performed using the standard protocol without intravenous contrast. Multiplanar CT image reconstructions of the cervical spine and maxillofacial structures were also generated.  Comparison:  05/27/2009 head CT  CT HEAD  Findings: There is no evidence for acute hemorrhage, hydrocephalus, mass lesion, or abnormal extra-axial fluid collection.  No definite CT evidence for acute infarction.  No displaced calvarial fracture. Mastoid air cells are clear.  IMPRESSION: No acute intracranial abnormality.  CT MAXILLOFACIAL  Findings:  Globes are symmetric.  Lenses located.  No retrobulbar hematoma.  Orbital walls and sinus walls are intact.  Intact nasal bones and nasal septum.  Intact pterygoid plates and zygomatic arches.  Located temporomandibular joints.  Poor dentition.  No mandible fracture.  IMPRESSION: No acute maxillofacial bone fracture.  CT CERVICAL  SPINE  Findings:   Maintained craniocervical relationship.  No dens fracture.  Maintained vertebral body height alignment. Prevertebral soft tissues are within normal limits.  IMPRESSION: No acute osseous abnormality of the cervical spine.   Original Report Authenticated By: Jearld Lesch, M.D.    Ct Cervical Spine Wo Contrast  08/30/2012  *RADIOLOGY REPORT*  Clinical Data:  UNRESPONSIVE, OVERDOSE.  CT HEAD WITHOUT CONTRAST CT MAXILLOFACIAL WITHOUT CONTRAST CT CERVICAL SPINE WITHOUT CONTRAST  Technique:  Multidetector CT imaging of the head, cervical spine, and maxillofacial structures were performed using the standard protocol without intravenous contrast. Multiplanar CT image reconstructions of the cervical spine and maxillofacial structures were also generated.  Comparison:  05/27/2009 head CT  CT HEAD  Findings: There is no evidence for acute hemorrhage, hydrocephalus, mass lesion, or abnormal extra-axial fluid collection.  No definite CT evidence for acute infarction.  No displaced calvarial fracture. Mastoid air cells are clear.  IMPRESSION: No acute intracranial abnormality.  CT MAXILLOFACIAL  Findings:  Globes are symmetric.  Lenses located.  No retrobulbar hematoma.  Orbital walls and sinus walls are intact.  Intact nasal bones and nasal septum.  Intact pterygoid plates and zygomatic arches.  Located temporomandibular joints.  Poor dentition.  No mandible fracture.  IMPRESSION: No acute maxillofacial bone fracture.  CT CERVICAL SPINE  Findings:   Maintained craniocervical relationship.  No dens fracture.  Maintained vertebral body height alignment. Prevertebral soft tissues are within normal limits.  IMPRESSION: No acute osseous abnormality of the cervical spine.   Original Report Authenticated By: Jearld Lesch, M.D.    Ct Maxillofacial Wo Cm  08/30/2012  *RADIOLOGY REPORT*  Clinical Data:  UNRESPONSIVE, OVERDOSE.  CT HEAD WITHOUT CONTRAST CT MAXILLOFACIAL WITHOUT CONTRAST CT CERVICAL SPINE  WITHOUT CONTRAST  Technique:  Multidetector CT imaging of the head, cervical spine, and maxillofacial structures were performed using the standard protocol without intravenous contrast. Multiplanar CT image reconstructions of the cervical spine and maxillofacial structures were also generated.  Comparison:  05/27/2009 head CT  CT HEAD  Findings: There is no evidence for acute hemorrhage, hydrocephalus, mass lesion, or abnormal extra-axial fluid collection.  No definite CT evidence for acute infarction.  No displaced calvarial fracture. Mastoid air cells are clear.  IMPRESSION: No acute intracranial abnormality.  CT MAXILLOFACIAL  Findings:  Globes are symmetric.  Lenses located.  No retrobulbar hematoma.  Orbital walls and sinus walls are intact.  Intact nasal bones and nasal septum.  Intact pterygoid plates and zygomatic arches.  Located temporomandibular joints.  Poor dentition.  No mandible fracture.  IMPRESSION: No acute maxillofacial bone fracture.  CT CERVICAL SPINE  Findings:   Maintained craniocervical relationship.  No dens fracture.  Maintained vertebral body height alignment. Prevertebral soft tissues are within normal limits.  IMPRESSION: No acute osseous abnormality of the cervical spine.   Original Report Authenticated By: Jearld Lesch, M.D.     Scheduled Meds: . fentaNYL  25 mcg Transdermal Q72H  . levofloxacin (LEVAQUIN) IV  500 mg Intravenous QHS  . sodium chloride  3 mL Intravenous Q12H  . vancomycin  1,000 mg Intravenous Q12H   Continuous Infusions: . dextrose 5% lactated ringers 100 mL/hr at 08/31/12 0305  .  sodium bicarbonate infusion 1/4 NS 1000 mL      Principal Problem:   Metabolic acidosis Active Problems:   PAIN, CHRONIC NEC   INTERSTITIAL CYSTITIS   UTI   Bipolar I disorder with mania   Metabolic encephalopathy   Suicide attempt   Hypotension, unspecified   Suicide attempt by substance overdose   Bipolar I disorder, most recent episode (or current) depressed,  severe, without mention of psychotic behavior    Time spent: 70    Yenni Carra C  [facility] Pager [phone] If 8PM-8AM, please contact night-coverage at [email or web address], password [facility] 09/01/2012, 9:42 AM  LOS: 2 days

## 2012-09-02 ENCOUNTER — Encounter (HOSPITAL_COMMUNITY): Payer: Self-pay

## 2012-09-02 ENCOUNTER — Inpatient Hospital Stay (HOSPITAL_COMMUNITY)
Admission: AD | Admit: 2012-09-02 | Discharge: 2012-09-05 | DRG: 885 | Disposition: A | Discharge status: Home / Self Care | Payer: Medicare Other | Source: Intra-hospital | Attending: Psychiatry | Admitting: Psychiatry

## 2012-09-02 DIAGNOSIS — F3189 Other bipolar disorder: Secondary | ICD-10-CM | POA: Diagnosis present

## 2012-09-02 DIAGNOSIS — Z79899 Other long term (current) drug therapy: Secondary | ICD-10-CM

## 2012-09-02 DIAGNOSIS — F314 Bipolar disorder, current episode depressed, severe, without psychotic features: Secondary | ICD-10-CM

## 2012-09-02 DIAGNOSIS — F3110 Bipolar disorder, current episode manic without psychotic features, unspecified: Principal | ICD-10-CM | POA: Diagnosis present

## 2012-09-02 DIAGNOSIS — F429 Obsessive-compulsive disorder, unspecified: Secondary | ICD-10-CM | POA: Diagnosis present

## 2012-09-02 LAB — BASIC METABOLIC PANEL
BUN: 7 mg/dL (ref 6–23)
BUN: 7 mg/dL (ref 6–23)
CO2: 21 mEq/L (ref 19–32)
CO2: 20 mEq/L (ref 19–32)
Calcium: 9.1 mg/dL (ref 8.4–10.5)
Calcium: 9.4 mg/dL (ref 8.4–10.5)
Chloride: 106 mEq/L (ref 96–112)
Chloride: 106 mEq/L (ref 96–112)
Creatinine, Ser: 0.58 mg/dL (ref 0.50–1.10)
Creatinine, Ser: 0.6 mg/dL (ref 0.50–1.10)
GFR calc Af Amer: >90 mL/min (ref >90)
GFR calc Af Amer: >90 mL/min (ref >90)
GFR calc non Af Amer: >90 mL/min (ref >90)
GFR calc non Af Amer: >90 mL/min (ref >90)
Glucose, Bld: 103 mg/dL — ABNORMAL HIGH (ref 70–99)
Glucose, Bld: 104 mg/dL — ABNORMAL HIGH (ref 70–99)
Potassium: 3 mEq/L — ABNORMAL LOW (ref 3.5–5.1)
Potassium: 3.6 mEq/L (ref 3.5–5.1)
Sodium: 138 mEq/L (ref 135–145)
Sodium: 140 mEq/L (ref 135–145)

## 2012-09-02 MED ORDER — ACETAMINOPHEN 325 MG PO TABS
650.0000 mg | ORAL_TABLET | Freq: Four times a day (QID) | ORAL | Status: DC | PRN
  Administered 2012-09-03 – 2012-09-05 (×4): 650 mg via ORAL

## 2012-09-02 MED ORDER — BACITRACIN-NEOMYCIN-POLYMYXIN 400-5-5000 EX OINT: TOPICAL_OINTMENT | CUTANEOUS | Status: DC | PRN

## 2012-09-02 MED ORDER — BISACODYL 5 MG PO TBEC: 10.0000 mg | DELAYED_RELEASE_TABLET | Freq: Every day | ORAL | Status: DC | PRN

## 2012-09-02 MED ORDER — LORAZEPAM 2 MG PO TABS: 1.0000 mg | ORAL_TABLET | Freq: Three times a day (TID) | ORAL | Status: DC | PRN

## 2012-09-02 MED ORDER — ACETAMINOPHEN 325 MG PO TABS: 650.0000 mg | ORAL_TABLET | Freq: Four times a day (QID) | ORAL | Status: DC | PRN

## 2012-09-02 MED ORDER — POTASSIUM CHLORIDE CRYS ER 20 MEQ PO TBCR: 40.0000 meq | EXTENDED_RELEASE_TABLET | ORAL | Status: DC

## 2012-09-02 MED ORDER — MAGNESIUM HYDROXIDE 400 MG/5ML PO SUSP: 30.0000 mL | Freq: Every day | ORAL | Status: DC | PRN

## 2012-09-02 MED ORDER — IBUPROFEN 400 MG PO TABS
400.0000 mg | ORAL_TABLET | Freq: Once | ORAL | Status: AC
  Administered 2012-09-02: 400 mg via ORAL
  Filled 2012-09-02: qty 1

## 2012-09-02 MED ORDER — POTASSIUM CHLORIDE CRYS ER 20 MEQ PO TBCR
40.0000 meq | EXTENDED_RELEASE_TABLET | ORAL | Status: AC
  Administered 2012-09-02 (×3): 40 meq via ORAL
  Filled 2012-09-02 (×3): qty 2

## 2012-09-02 MED ORDER — ALUM & MAG HYDROXIDE-SIMETH 200-200-20 MG/5ML PO SUSP: 30.0000 mL | ORAL | Status: DC | PRN

## 2012-09-02 NOTE — BH Assessment (Signed)
[facility] Assessment Progress Note  At the request of Jacquelyne Balint, RN, Neurosurgeon, I called pt's nurse, Judeth Cornfield, to ask that transfer be delayed until further notice.  Call was placed at 08:25.  Doylene Canning, MA Assessment Counselor 09/02/2012 @ 08:28

## 2012-09-02 NOTE — Tx Team (Signed)
Initial Interdisciplinary Treatment Plan  PATIENT STRENGTHS: (choose at least two) Ability for insight Average or above average intelligence Supportive family/friends  PATIENT STRESSORS: Health problems Traumatic event   PROBLEM LIST: Problem List/Patient Goals Date to be addressed Date deferred Reason deferred Estimated date of resolution  Coping skills related to health 09/02/12     Depression                                                 DISCHARGE CRITERIA:  Improved stabilization in mood, thinking, and/or behavior Motivation to continue treatment in a less acute level of care Need for constant or close observation no longer present Verbal commitment to aftercare and medication compliance  PRELIMINARY DISCHARGE PLAN: Return to previous living arrangement  PATIENT/FAMIILY INVOLVEMENT: This treatment plan has been presented to and reviewed with the patient, [name], and/or family member, .  The patient and family have been given the opportunity to ask questions and make suggestions.  [name] 09/02/2012, 11:01 PM

## 2012-09-02 NOTE — Progress Notes (Signed)
Voluntary admission for a 32 y.o. Female with flat affect, anxious mood.   Pt. Reports that 3 days ago she  Had problems sleeping and problems with pain so she overdosed on her medications.  Pt. Denies SI/HI and denies A/V hallucinations and contracts for safety. Denies Substance abuse.  Pt. Oriented to unit.  Support given.

## 2012-09-02 NOTE — Progress Notes (Signed)
Patient discharging/readmit to [facility].  Report called to [facility], Charity fundraiser.  Will discharge with security.  Will continue to monitor.

## 2012-09-02 NOTE — Discharge Summary (Signed)
Physician Discharge Summary  [name] ZOX:[id number] DOB: 29-Nov-1980 DOA: 08/30/2012  PCP: Mady Gemma, PA  Admit date: 08/30/2012 Discharge date: 09/02/2012  Time spent:>107minutes  Recommendations for Outpatient Follow-up:  1. Transfer to [facility] for inpatient psychiatry treatment  Discharge Diagnoses:  Principal Problem:   Metabolic acidosis Active Problems:   PAIN, CHRONIC NEC   INTERSTITIAL CYSTITIS   UTI   Bipolar I disorder with mania   Metabolic encephalopathy   Suicide attempt   Hypotension, unspecified   Suicide attempt by substance overdose   Bipolar I disorder, most recent episode (or current) depressed, severe, without mention of psychotic behavior   Discharge Condition: STABLE  Diet recommendation: REGULAR  Filed Weights   08/31/12 0230 09/02/12 0100  Weight: 63.6 kg (140 lb 3.4 oz) 62.7 kg (138 lb 3.7 oz)    History of present illness:  [name] is a 32 y.o. female  With history of Bipolar disorder, chronic/interstitial cystitis, and chronic lower back pain. Presents to the ED 2ary to Altered mental status. History is obtained from mother who is at bedside. She reports that patient asked mother to refill one of her medications (topamax) and when she returned to the house she found patient lying on floor unresponsive. Patient uses fentanyl patch at home for chronic pain. Reportedly in field was given narcan by EMS and patient reportedly was more responsive. She was admitted for further evaluation and management.    Hospital Course:  1.Suicide attempt/drug overdose  - as discussed above, following hospitalization when her mentation improved she was able to give a better history and admitted to taking drug overdose -states she took a 'whole lot pills' but unable to remember this time the names (except for Ativan), intentionally and admits to suicidal ideations  -Suicide precautions-one-on-one sitter were instituted in the hospital. -Also all her psych  medications where held in the hospital, Ativan resumed when necessary upon discharge as well as her lamictal. He chills consulted saw patient and recommended inpatient treatment at [facility]-psychiatrist it to review the rest of her outpatient medications and adjust/resume them as clinically appropriate. 2.Altered mental status/acute encephalopathy  -Likely secondary to #1, resolved  3? Urinary tract infection/interstitial cystitis  -Urinalysis not very impressive for a UTI, she has a history of interstitial cystitis  -She was empirically started on antibiotics on admission, Urine cultures to date show no growth, she'll not require any further antibiotics upon discharge.  4. Hypotension -resolved -More likely secondary to #1, improving with hydration. She was empirically also treated for possible urinary tract infection with broad-spectrum antibiotics and the urine cultures came back with no growth and antibiotics have been discontinued.-IV fluids, follow continue monitoring today.  5. Metabolic acidosis, non-gap  -Likely secondary to #1, lactic acid level within normal limits, follow.  -Per CO2 worsened to 15 on 2/21 and she was placed on bicarbonate IV and on followup this morning this has resolved-bicarbonate 20 today. 6. bipolar disorder  -psych consult consulted as above, his been transferred to [facility] on her outpatient medications to be adjusted as appropriate per psychiatrist there.  7. Hypokalemia- potassium repletion was ordered in the hospital. 8. Chronic back pain -Her Fentanyl was resumed to the hospital.  Procedures:  NONE  Consultations:  Psychiatry  Discharge Exam: Filed Vitals:   09/02/12 0400 09/02/12 0500 09/02/12 0600 09/02/12 0800  BP: 129/80  129/77 143/92  Pulse: 74 67 68 69  Temp:  97.9 F (36.6 C)  98.8 F (37.1 C)  TempSrc:  Oral  Core (  Comment)  Resp: 14 10 11 12   Height:      Weight:      SpO2: 100% 98% 99% 100%   Exam:  Gen.-Young female, somnolent but  arousable in no apparent distress  Cardiovascular-regular rate and rhythm, S1-S2  Lungs-clear to auscultation bilaterally no crackles or wheezes  Abdomen-soft, bowel sounds present nontender nondistended no organomegaly no masses palpable  Extremities: No cyanosis and no edema    Discharge Instructions  Discharge Orders   Future Orders Complete By Expires     Diet general  As directed     Increase activity slowly  As directed         Medication List    STOP taking these medications       desipramine 25 MG tablet  Commonly known as:  NORPRAMIN     INTERMEZZO 3.5 MG Subl  Generic drug:  Zolpidem Tartrate     OLANZapine 10 MG tablet  Commonly known as:  ZYPREXA     promethazine 50 MG tablet  Commonly known as:  PHENERGAN     temazepam 30 MG capsule  Commonly known as:  RESTORIL      TAKE these medications       azelastine 137 MCG/SPRAY nasal spray  Commonly known as:  ASTELIN  Place 1 spray into the nose 2 (two) times daily. Use in each nostril as directed     benzonatate 100 MG capsule  Commonly known as:  TESSALON  Take 100 mg by mouth 3 (three) times daily as needed for cough.     bisacodyl 5 MG EC tablet  Commonly known as:  DULCOLAX  Take 2 tablets (10 mg total) by mouth daily as needed.     diclofenac sodium 1 % Gel  Commonly known as:  VOLTAREN  Apply 2 g topically 4 (four) times daily.     doxazosin 4 MG tablet  Commonly known as:  CARDURA  Take 4 mg by mouth at bedtime.     famotidine 20 MG tablet  Commonly known as:  PEPCID  Take 20 mg by mouth daily.     fentaNYL 25 MCG/HR  Commonly known as:  DURAGESIC - dosed mcg/hr  Place 1 patch onto the skin every 3 (three) days.     fish oil-omega-3 fatty acids 1000 MG capsule  Take 1 g by mouth every morning.     fluticasone 50 MCG/ACT nasal spray  Commonly known as:  FLONASE  Place 2 sprays into the nose daily.     lamoTRIgine 100 MG tablet  Commonly known as:  LAMICTAL  Take 2 tablets (200  mg total) by mouth 2 (two) times daily.     levocetirizine 5 MG tablet  Commonly known as:  XYZAL  Take 5 mg by mouth every evening.     LORazepam 2 MG tablet  Commonly known as:  ATIVAN  Take 0.5 tablets (1 mg total) by mouth every 8 (eight) hours as needed for anxiety.     neomycin-bacitracin-polymyxin ointment  Commonly known as:  NEOSPORIN  Apply topically as needed. apply to eye     ondansetron 8 MG tablet  Commonly known as:  ZOFRAN  Take by mouth every 8 (eight) hours as needed. For nausea     oxybutynin 15 MG 24 hr tablet  Commonly known as:  DITROPAN XL  Take 15 mg by mouth 2 (two) times daily.     potassium chloride SA 20 MEQ tablet  Commonly known as:  K-DUR,KLOR-CON  Take 2 tablets (40 mEq total) by mouth every 4 (four) hours. x3doses ordered on 2/22     SUMAtriptan 100 MG tablet  Commonly known as:  IMITREX  Take 100 mg by mouth every 2 (two) hours as needed. MIGRAINE     topiramate 100 MG tablet  Commonly known as:  TOPAMAX  Take 100-200 mg by mouth 3 (three) times daily. 2 tablets in the AM 1 tablet in the afternoon and 1 tablet at bedtime          The results of significant diagnostics from this hospitalization (including imaging, microbiology, ancillary and laboratory) are listed below for reference.    Significant Diagnostic Studies: Dg Chest 1 View  08/30/2012  *RADIOLOGY REPORT*  Clinical Data: Altered mental status  CHEST - 1 VIEW  Comparison: 02/29/2012  Findings: Hypoaeration with mild hemidiaphragm elevation.  Lungs clear.  No pleural effusion or pneumothorax.  No acute osseous finding.  IMPRESSION: Hypoaeration without radiographic evidence of acute cardiopulmonary process.   Original Report Authenticated By: Jearld Lesch, M.D.    Ct Head Wo Contrast  08/30/2012  *RADIOLOGY REPORT*  Clinical Data:  UNRESPONSIVE, OVERDOSE.  CT HEAD WITHOUT CONTRAST CT MAXILLOFACIAL WITHOUT CONTRAST CT CERVICAL SPINE WITHOUT CONTRAST  Technique:  Multidetector  CT imaging of the head, cervical spine, and maxillofacial structures were performed using the standard protocol without intravenous contrast. Multiplanar CT image reconstructions of the cervical spine and maxillofacial structures were also generated.  Comparison:  05/27/2009 head CT  CT HEAD  Findings: There is no evidence for acute hemorrhage, hydrocephalus, mass lesion, or abnormal extra-axial fluid collection.  No definite CT evidence for acute infarction.  No displaced calvarial fracture. Mastoid air cells are clear.  IMPRESSION: No acute intracranial abnormality.  CT MAXILLOFACIAL  Findings:  Globes are symmetric.  Lenses located.  No retrobulbar hematoma.  Orbital walls and sinus walls are intact.  Intact nasal bones and nasal septum.  Intact pterygoid plates and zygomatic arches.  Located temporomandibular joints.  Poor dentition.  No mandible fracture.  IMPRESSION: No acute maxillofacial bone fracture.  CT CERVICAL SPINE  Findings:   Maintained craniocervical relationship.  No dens fracture.  Maintained vertebral body height alignment. Prevertebral soft tissues are within normal limits.  IMPRESSION: No acute osseous abnormality of the cervical spine.   Original Report Authenticated By: Jearld Lesch, M.D.    Ct Cervical Spine Wo Contrast  08/30/2012  *RADIOLOGY REPORT*  Clinical Data:  UNRESPONSIVE, OVERDOSE.  CT HEAD WITHOUT CONTRAST CT MAXILLOFACIAL WITHOUT CONTRAST CT CERVICAL SPINE WITHOUT CONTRAST  Technique:  Multidetector CT imaging of the head, cervical spine, and maxillofacial structures were performed using the standard protocol without intravenous contrast. Multiplanar CT image reconstructions of the cervical spine and maxillofacial structures were also generated.  Comparison:  05/27/2009 head CT  CT HEAD  Findings: There is no evidence for acute hemorrhage, hydrocephalus, mass lesion, or abnormal extra-axial fluid collection.  No definite CT evidence for acute infarction.  No displaced  calvarial fracture. Mastoid air cells are clear.  IMPRESSION: No acute intracranial abnormality.  CT MAXILLOFACIAL  Findings:  Globes are symmetric.  Lenses located.  No retrobulbar hematoma.  Orbital walls and sinus walls are intact.  Intact nasal bones and nasal septum.  Intact pterygoid plates and zygomatic arches.  Located temporomandibular joints.  Poor dentition.  No mandible fracture.  IMPRESSION: No acute maxillofacial bone fracture.  CT CERVICAL SPINE  Findings:   Maintained craniocervical relationship.  No dens fracture.  Maintained vertebral body  height alignment. Prevertebral soft tissues are within normal limits.  IMPRESSION: No acute osseous abnormality of the cervical spine.   Original Report Authenticated By: Jearld Lesch, M.D.    Ct Maxillofacial Wo Cm  08/30/2012  *RADIOLOGY REPORT*  Clinical Data:  UNRESPONSIVE, OVERDOSE.  CT HEAD WITHOUT CONTRAST CT MAXILLOFACIAL WITHOUT CONTRAST CT CERVICAL SPINE WITHOUT CONTRAST  Technique:  Multidetector CT imaging of the head, cervical spine, and maxillofacial structures were performed using the standard protocol without intravenous contrast. Multiplanar CT image reconstructions of the cervical spine and maxillofacial structures were also generated.  Comparison:  05/27/2009 head CT  CT HEAD  Findings: There is no evidence for acute hemorrhage, hydrocephalus, mass lesion, or abnormal extra-axial fluid collection.  No definite CT evidence for acute infarction.  No displaced calvarial fracture. Mastoid air cells are clear.  IMPRESSION: No acute intracranial abnormality.  CT MAXILLOFACIAL  Findings:  Globes are symmetric.  Lenses located.  No retrobulbar hematoma.  Orbital walls and sinus walls are intact.  Intact nasal bones and nasal septum.  Intact pterygoid plates and zygomatic arches.  Located temporomandibular joints.  Poor dentition.  No mandible fracture.  IMPRESSION: No acute maxillofacial bone fracture.  CT CERVICAL SPINE  Findings:   Maintained  craniocervical relationship.  No dens fracture.  Maintained vertebral body height alignment. Prevertebral soft tissues are within normal limits.  IMPRESSION: No acute osseous abnormality of the cervical spine.   Original Report Authenticated By: Jearld Lesch, M.D.     Microbiology: Recent Results (from the past 240 hour(s))  URINE CULTURE     Status: None   Collection Time    08/30/12  8:37 PM      Result Value Range Status   Specimen Description URINE, CATHETERIZED   Final   Special Requests NONE   Final   Culture  Setup Time 08/31/2012 18:41   Final   Colony Count NO GROWTH   Final   Culture NO GROWTH   Final   Report Status 09/01/2012 FINAL   Final  MRSA PCR SCREENING     Status: None   Collection Time    08/31/12  2:52 AM      Result Value Range Status   MRSA by PCR NEGATIVE  NEGATIVE Final   Comment:            The GeneXpert MRSA Assay (FDA     approved for NASAL specimens     only), is one component of a     comprehensive MRSA colonization     surveillance program. It is not     intended to diagnose MRSA     infection nor to guide or     monitor treatment for     MRSA infections.     Labs: Basic Metabolic Panel:  Recent Labs Lab 08/30/12 2035 08/31/12 0510 09/01/12 0335 09/01/12 1255 09/01/12 2100 09/02/12 0506  NA 136 144 139 139 138 138  K 4.8 3.1* 3.4* 3.7 3.1* 3.0*  CL 101 116* 114* 108 108 106  CO2 17* 18* 15* 18* 19 21  GLUCOSE 122* 117* 93 75 109* 103*  BUN 21 12 5* 5* 7 7  CREATININE 0.93 0.63 0.72 0.64 0.65 0.58  CALCIUM 9.5 8.6 8.6 8.9 9.1 9.1  MG  --  1.8  --   --   --   --   PHOS  --  1.9*  --   --   --   --    Liver Function Tests:  Recent Labs Lab 08/30/12 2035  AST 38*  ALT 22  ALKPHOS 60  BILITOT 0.3  PROT 7.9  ALBUMIN 4.6   No results found for this basename: LIPASE, AMYLASE,  in the last 168 hours No results found for this basename: AMMONIA,  in the last 168 hours CBC:  Recent Labs Lab 08/30/12 2035 08/31/12 0510   WBC 14.6* 10.8*  NEUTROABS 12.0*  --   HGB 13.8 12.6  HCT 39.7 36.5  MCV 87.4 87.3  PLT 253 229   Cardiac Enzymes:  Recent Labs Lab 08/30/12 2035  CKTOTAL 222*   BNP: BNP (last 3 results) No results found for this basename: PROBNP,  in the last 8760 hours CBG: No results found for this basename: GLUCAP,  in the last 168 hours     Signed:  Shikha Bibb C  [facility] 09/02/2012, 9:30 AM

## 2012-09-02 NOTE — BH Assessment (Signed)
[facility] Assessment Progress Note   At 18:13 I spoke to Jacquelyne Balint, Charity fundraiser, Neurosurgeon.  She reports that [employer], FNP has reconsidered pt and agrees to accept her to [facility] to the service of Geoffery Lyons, MD.  Room assignment has been changed to 305-1.  Pt's nurse, Judeth Cornfield, called subsequently and spoke directly to [facility], obtaining notification and room assignment.  Doylene Canning, MA Assessment Counselor 09/02/2012 @ 18:17

## 2012-09-02 NOTE — BH Assessment (Signed)
[facility] Assessment Progress Note  Per Jacquelyne Balint, RN, Administrative Coordinator, pt has been reviewed again by Assunta Found, FNP at 16:55.  She would like for the pt to be medically observed for another 24 hours.  Pt's nurse, name unspecified, called and spoke to Venda Rodes, [facility], Assessment Counselor, at 17:00 at which time she was notified.  Doylene Canning, MA Assessment Counselor 09/02/2012 @ 17:04

## 2012-09-03 ENCOUNTER — Encounter (HOSPITAL_COMMUNITY): Payer: Self-pay | Admitting: Physician Assistant

## 2012-09-03 DIAGNOSIS — F3110 Bipolar disorder, current episode manic without psychotic features, unspecified: Principal | ICD-10-CM

## 2012-09-03 DIAGNOSIS — F4310 Post-traumatic stress disorder, unspecified: Secondary | ICD-10-CM

## 2012-09-03 MED ORDER — DOXAZOSIN MESYLATE 4 MG PO TABS
4.0000 mg | ORAL_TABLET | Freq: Every day | ORAL | Status: DC
  Administered 2012-09-03 – 2012-09-04 (×2): 4 mg via ORAL
  Filled 2012-09-03: qty 4
  Filled 2012-09-03 (×3): qty 1

## 2012-09-03 MED ORDER — OLANZAPINE 5 MG PO TABS
5.0000 mg | ORAL_TABLET | Freq: Every day | ORAL | Status: DC
  Administered 2012-09-03 – 2012-09-04 (×2): 5 mg via ORAL
  Filled 2012-09-03: qty 4
  Filled 2012-09-03 (×3): qty 1

## 2012-09-03 MED ORDER — TRAZODONE HCL 50 MG PO TABS
50.0000 mg | ORAL_TABLET | Freq: Every evening | ORAL | Status: DC | PRN
  Administered 2012-09-03 – 2012-09-04 (×3): 50 mg via ORAL
  Filled 2012-09-03: qty 8
  Filled 2012-09-03 (×5): qty 1
  Filled 2012-09-03: qty 8
  Filled 2012-09-03: qty 1

## 2012-09-03 MED ORDER — LAMOTRIGINE 25 MG PO TABS
25.0000 mg | ORAL_TABLET | Freq: Every day | ORAL | Status: DC
  Administered 2012-09-03 – 2012-09-05 (×3): 25 mg via ORAL
  Filled 2012-09-03 (×2): qty 1
  Filled 2012-09-03: qty 4
  Filled 2012-09-03 (×3): qty 1

## 2012-09-03 MED ORDER — TOPIRAMATE 25 MG PO TABS
25.0000 mg | ORAL_TABLET | Freq: Three times a day (TID) | ORAL | Status: DC
  Administered 2012-09-03 – 2012-09-05 (×9): 25 mg via ORAL
  Filled 2012-09-03: qty 16
  Filled 2012-09-03 (×9): qty 1
  Filled 2012-09-03: qty 16
  Filled 2012-09-03: qty 1
  Filled 2012-09-03: qty 16
  Filled 2012-09-03: qty 1
  Filled 2012-09-03: qty 16
  Filled 2012-09-03: qty 1

## 2012-09-03 NOTE — H&P (Signed)
I have examined the patient an agreed with the findings of H&P 

## 2012-09-03 NOTE — Clinical Social Work Note (Signed)
[facility] Group Notes: (Clinical Social Work)   09/03/2012      Type of Therapy:  Group Therapy   Participation Level:  Did Not Attend  - Came to group, but immediately left   Ambrose Mantle, LCSW 09/03/2012, 11:12 AM

## 2012-09-03 NOTE — Progress Notes (Signed)
Pt has been isolative to room all evening, did not attend group. Reports menstrual cramping and pain from interstitial cystitis and rates it a 10/10. Was unable to come up to med window for meds. She is flat, anxious, depressed. Does deny SI/HI/AVH. Medicated per orders without difficulty. Support, encouragement given. Heat packs provided with some relief to abdominal/pelvic area. Will continue to monitor.  Lawrence Marseilles
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
## 2012-09-03 NOTE — Progress Notes (Signed)
Pt complaining of severe acute nausea @ 0320 "from the pain".  Has no orders for nausea meds, so given gatorade to try to calm stomach.  Will reassess by 0400 to determine if MD medication order is indicated. Dion Saucier RN

## 2012-09-03 NOTE — Progress Notes (Signed)
Patient ID: [name], female   DOB: 25-Nov-1980, 32 y.o.   MRN: [id number]  D: Patient with bright affect, pleasant and cooperative. Pt hyperverbal. A: Monitor patient Q 15 minutes for safety, encourage staff/peer interaction, group participation and administer medications as ordered. R: Patient denies SI or plans to harm herself and remains pleasant.

## 2012-09-04 MED ORDER — ONDANSETRON 4 MG PO TBDP
4.0000 mg | ORAL_TABLET | Freq: Four times a day (QID) | ORAL | Status: DC | PRN
  Administered 2012-09-04: 4 mg via ORAL

## 2012-09-04 MED ORDER — BACITRACIN-NEOMYCIN-POLYMYXIN OINTMENT TUBE
TOPICAL_OINTMENT | Freq: Four times a day (QID) | CUTANEOUS | Status: DC
  Administered 2012-09-04 – 2012-09-05 (×5): 1 via TOPICAL
  Filled 2012-09-04: qty 15

## 2012-09-04 MED ORDER — IBUPROFEN 600 MG PO TABS
600.0000 mg | ORAL_TABLET | Freq: Four times a day (QID) | ORAL | Status: DC | PRN
  Administered 2012-09-04 – 2012-09-05 (×2): 600 mg via ORAL
  Filled 2012-09-04 (×2): qty 1
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
## 2012-09-04 NOTE — Progress Notes (Signed)
Recreation Therapy Notes   Date: 02.24.2014  Time: 2:45pm  Location: [address] Day Room   Group Topic/Focus: Musician (AAA/T)   Participation Level:  Active   Participation Quality:  Appropriate   Affect:  Appropriate   Cognitive:  Appropriate   Additional Comments: AAA/T group session 02.24.2014 included AAA dog team. Patient with peers visited with dog team. Patient got to pet [place], the dog. Patient interacted appropriately with dog team, peers, and LRT.   Marykay Lex Aamina Skiff, LRT/CTRS   Ketan Renz L 09/04/2012 4:00 PM

## 2012-09-04 NOTE — Progress Notes (Signed)
Patient did not attend the evening speaker AA meeting. Pt was notified that group was beginning but remained in bed.   

## 2012-09-04 NOTE — Progress Notes (Signed)
[facility] MD Progress Note  09/04/2012 4:14 PM [name]  MRN:  [id number] Subjective:  [name] endorses that she is having difficulty with her period. States that when she has her  period "she goes crazy." She does not know what happened. She fell and she woke up with hematoma on her face. Things it had to do with all the medications she takes Diagnosis:  Mood Disorder, Anxiety Disorder  ADL's:  Intact  Sleep: Fair  Appetite:  Fair  Suicidal Ideation:  Plan:  denies Intent:  denies Means:  denies Homicidal Ideation:  Plan:  denies Intent:  denies Means:  denies AEB (as evidenced by):  Psychiatric Specialty Exam: Review of Systems  Constitutional: Negative.   HENT: Negative.   Eyes: Negative.   Respiratory: Negative.   Cardiovascular: Negative.   Gastrointestinal: Positive for abdominal pain.  Genitourinary: Negative.   Musculoskeletal: Negative.   Skin: Negative.   Neurological: Negative.   Endo/Heme/Allergies: Negative.   Psychiatric/Behavioral: Positive for depression. The patient is nervous/anxious.     Blood pressure 124/83, pulse 131, temperature 97 F (36.1 C), temperature source Oral, resp. rate 20, height 5\' 2"  (1.575 m), weight 57.607 kg (127 lb), last menstrual period 08/01/2012.Body mass index is 23.22 kg/(m^2).  General Appearance: Fairly Groomed  Patent attorney::  Fair  Speech:  Clear and Coherent and Slow  Volume:  Decreased  Mood:  Anxious and worried  Affect:  Restricted  Thought Process:  Coherent and Goal Directed  Orientation:  Full (Time, Place, and Person)  Thought Content:  worries, concerns  Suicidal Thoughts:  No  Homicidal Thoughts:  No  Memory:  Immediate;   Fair Recent;   Fair Remote;   Fair  Judgement:  Fair  Insight:  Shallow  Psychomotor Activity:  Restlessness  Concentration:  Fair  Recall:  Fair  Akathisia:  No  Handed:  Right  AIMS (if indicated):     Assets:  Desire for Improvement  Sleep:  Number of Hours: 6.25   Current  Medications: Current Facility-Administered Medications  Medication Dose Route Frequency Provider Last Rate Last Dose  . acetaminophen (TYLENOL) tablet 650 mg  650 mg Oral Q6H PRN Shuvon Rankin, NP   650 mg at 09/04/12 1547  . alum & mag hydroxide-simeth (MAALOX/MYLANTA) 200-200-20 MG/5ML suspension 30 mL  30 mL Oral Q4H PRN Shuvon Rankin, NP      . doxazosin (CARDURA) tablet 4 mg  4 mg Oral QHS [name], PA-C   4 mg at 09/03/12 2136  . ibuprofen (ADVIL,MOTRIN) tablet 600 mg  600 mg Oral Q6H PRN Rachael Fee, MD   600 mg at 09/04/12 1157  . lamoTRIgine (LAMICTAL) tablet 25 mg  25 mg Oral Daily [name], PA-C   25 mg at 09/04/12 0810  . magnesium hydroxide (MILK OF MAGNESIA) suspension 30 mL  30 mL Oral Daily PRN Shuvon Rankin, NP      . neomycin-bacitracin-polymyxin (NEOSPORIN) ointment   Topical QID Rachael Fee, MD   1 application at 09/04/12 1155  . OLANZapine (ZYPREXA) tablet 5 mg  5 mg Oral QHS [name], PA-C   5 mg at 09/03/12 2136  . ondansetron (ZOFRAN-ODT) disintegrating tablet 4 mg  4 mg Oral Q6H PRN Rachael Fee, MD   4 mg at 09/04/12 1508  . topiramate (TOPAMAX) tablet 25 mg  25 mg Oral TID WC & HS [name], PA-C   25 mg at 09/04/12 1155  . traZODone (DESYREL) tablet 50 mg  50 mg  Oral QHS,MR X 1 [name], PA-C   50 mg at 09/03/12 2249    Lab Results: No results found for this or any previous visit (from the past 48 hour(s)).  Physical Findings: AIMS: Facial and Oral Movements Muscles of Facial Expression: None, normal Lips and Perioral Area: None, normal Jaw: None, normal Tongue: None, normal,Extremity Movements Upper (arms, wrists, hands, fingers): None, normal Lower (legs, knees, ankles, toes): None, normal, Trunk Movements Neck, shoulders, hips: None, normal, Overall Severity Severity of abnormal movements (highest score from questions above): None, normal Incapacitation due to abnormal movements: None, normal Patient's awareness of abnormal movements (rate only  patient's report): No Awareness, Dental Status Current problems with teeth and/or dentures?: No Does patient usually wear dentures?: No  CIWA:    COWS:     Treatment Plan Summary: Daily contact with patient to assess and evaluate symptoms and progress in treatment Medication management  Plan: Supportive approach/coping skills           Reassess medications  Medical Decision Making Problem Points:  Review of psycho-social stressors (1) Data Points:  Review of medication regiment & side effects (2)  I certify that inpatient services furnished can reasonably be expected to improve the patient's condition.   Latissa Frick A 09/04/2012, 4:14 PM

## 2012-09-04 NOTE — Tx Team (Signed)
Interdisciplinary Treatment Plan Update (Adult)  Date: 09/04/2012  Time Reviewed: 9:59 AM   Progress in Treatment: Attending groups: Not regularly as yet Participating in groups: No Taking medication as prescribed:  Otho Ket medication:  Yes Family/Significant othe contact made: Not as yet Patient understands diagnosis: Yes Discussing patient identified problems/goals with staff: Yes Medical problems stabilized or resolved:  Yes Denies suicidal/homicidal ideation: Yes Patient has not harmed self or Others: Yes  New problem(s) identified: None Identified  Discharge Plan or Barriers:  CSW is assessing for appropriate referrals, will meet with patient to access  Additional comments: N/A  Reason for Continuation of Hospitalization: Medication stabilization Withdrawal symptoms   Estimated length of stay: 2-3 days  For review of initial/current patient goals, please see plan of care.  Attendees: Patient:     Family:     Physician:  Geoffery Lyons 09/04/2012 9:59 AM   Nursing:   Roswell Miners, RN 09/04/2012 9:59 AM   Clinical Social Worker Ronda Fairly 09/04/2012 9:59 AM   Other:  Chinita Greenland, RN 09/04/2012 9:59 AM   Other:  Linton Rump, RN 09/04/2012 9:59 AM   Other:  Olivia Mackie, Psych Intern 09/04/2012 9:59 AM   Other:   09/04/2012 9:59 AM    Scribe for Treatment Team:   Carney Bern, LCSWA  09/04/2012 9:59 AM

## 2012-09-04 NOTE — Plan of Care (Signed)
Problem: Ineffective individual coping Goal: STG: Patient will participate in after care plan Outcome: Not Progressing Patient not attending discharge planning groups as yet  Problem: Alteration in mood; excessive anxiety as evidenced by: Goal: LTG-Patient's behavior demonstrates decreased anxiety (Patient's behavior demonstrates anxiety and he/she is utilizing learned coping skills to deal with anxiety-producing situations)  Outcome: Not Progressing Patient not coming to groups as yet, when invited by CSW she did not make eye contact

## 2012-09-04 NOTE — Progress Notes (Signed)
[facility] LCSW Group Therapy - Late Entry  09/04/2012 1:15 PM  Type of Therapy:  Group Therapy at 1:15 to 2:30 PM  Participation Level:  None, came in 15 minutes before group ended yet did not share  Participation Quality: None; present only  Affect:Flat  Cognitive: Oriented  Insight: None shared  Engagement in Therapy: None  Modes of Intervention:  Orientation, support  Summary of Progress/Problems:  Patient came into group 15 minutes before session ended.  CSW oriented patient to group process and provided opportunities for patient to participate which patient declined.   [name] 09/05/2012 10:15 AM

## 2012-09-04 NOTE — Progress Notes (Signed)
[facility] LCSW Aftercare Discharge Planning Group Note  09/04/2012 8:45 AM  Participation Quality: Did Not Attend  [name]

## 2012-09-05 DIAGNOSIS — F314 Bipolar disorder, current episode depressed, severe, without psychotic features: Secondary | ICD-10-CM

## 2012-09-05 MED ORDER — TOPIRAMATE 25 MG PO TABS: 25.0000 mg | ORAL_TABLET | Freq: Three times a day (TID) | ORAL | Status: DC

## 2012-09-05 MED ORDER — ONDANSETRON HCL 8 MG PO TABS: 4.0000 mg | ORAL_TABLET | Freq: Three times a day (TID) | ORAL | Status: DC | PRN

## 2012-09-05 MED ORDER — DOXAZOSIN MESYLATE 4 MG PO TABS: 4.0000 mg | ORAL_TABLET | Freq: Every day | ORAL | Status: DC

## 2012-09-05 MED ORDER — OLANZAPINE 5 MG PO TABS: 5.0000 mg | ORAL_TABLET | Freq: Every day | ORAL | Status: DC

## 2012-09-05 MED ORDER — LAMOTRIGINE 25 MG PO TABS: 25.0000 mg | ORAL_TABLET | Freq: Every day | ORAL | Status: DC

## 2012-09-05 MED ORDER — TRAZODONE HCL 50 MG PO TABS: 50.0000 mg | ORAL_TABLET | Freq: Every evening | ORAL | Status: DC | PRN

## 2012-09-05 NOTE — Progress Notes (Signed)
Pt reports she is still having abdominal pain.  She had tylenol shortly before change of shift.  Pt reports she went to some groups.  She denies SI/HI/AV at this time.  She intends to return home at discharge.  Support/encouragement given.  Pain meds given as ordered.  Also will give a heat pack for comfort.  Pt makes her needs known to staff.  Safety maintained with q15 minute checks.

## 2012-09-05 NOTE — Progress Notes (Signed)
[facility] INPATIENT:  Family/Significant Other Suicide Prevention Education  Suicide Prevention Education:  Education Completed; Patient's mother, [name] at 29. [phone] has been identified by the patient as the family member/significant other with whom the patient will be residing, and identified as the person(s) who will aid the patient in the event of a mental health crisis (suicidal ideations/suicide attempt).  With written consent from the patient, the family member/significant other has been provided the following suicide prevention education, prior to the and/or following the discharge of the patient.  The suicide prevention education provided includes the following:  Suicide risk factors  Suicide prevention and interventions  National Suicide Hotline telephone number  [facility] assessment telephone number  [facility] Emergency Assistance 911  [facility] and/or Residential Mobile Crisis Unit telephone number  Request made of family/significant other to:  Remove weapons (e.g., guns, rifles, knives), all items previously/currently identified as safety concern.  [name] reports there are no firearms in either her home nor patient's home.   Remove drugs/medications (over-the-counter, prescriptions, illicit drugs), all items previously/currently identified as a safety concern.  The family member/significant other verbalizes understanding of the suicide prevention education information provided.  The family member/significant other agrees to remove the items of safety concern listed above.  [name] 09/05/2012, 10:16 AM

## 2012-09-05 NOTE — Progress Notes (Signed)
[facility] LCSW Aftercare Discharge Planning Group Note  09/05/2012 10:24 AM  Participation Quality:  Attentive and Sharing  Affect:  Anxious  Cognitive:  Alert and Oriented  Insight:  Limited  Engagement in Group:  Developing/Improving  Modes of Intervention:  Clarification, Exploration, Rapport Building, Reality Testing and Support  Summary of Progress/Problems: Briyonna denies suicidal ideation and reports she is relieved she did not hurt herself when she took the overdose last week.  Patient reports she is invested in following up with her therapist and psychiatrist and becoming more honest with them.  On a scale of 1 to 10 with ten being the most ever experienced, the patient rates depression at a 0 and anxiety at a 5. Patient signed release for CSW to speak with her mother and reports mother is off work today in order to transport her home at discharge.    [name] 09/05/2012, 10:24 AM

## 2012-09-05 NOTE — Discharge Summary (Signed)
Physician Discharge Summary Note  Patient:  [name] is an 32 y.o., female MRN:  [id number] DOB:  [birth year] Patient phone:  [phone] (home)  Patient address:   [address] [place] [address],   Date of Admission:  09/02/2012  Date of Discharge: 09/05/12  Reason for Admission:  Medication overdose  Discharge Diagnoses: Active Problems:   * No active hospital problems. *  Review of Systems  Constitutional: Negative.   HENT: Negative.   Eyes: Negative.   Respiratory: Negative.   Cardiovascular: Negative.   Gastrointestinal: Negative.   Genitourinary: Negative.   Musculoskeletal: Negative.   Skin: Negative.   Neurological: Negative.   Endo/Heme/Allergies: Negative.   Psychiatric/Behavioral: Positive for substance abuse. Negative for depression, suicidal ideas, hallucinations and memory loss. The patient has insomnia (Stabilized with medication prior to discharge). The patient is not nervous/anxious.    Axis Diagnosis:   AXIS I:  Bipolar I disorder with mania AXIS II:  Deferred AXIS III:   Past Medical History  Diagnosis Date  . Bipolar 1 disorder   . Migraine   . Mental disorder   . Anxiety   . Interstitial cystitis   . Galactorrhea   . Chronic back pain   . Seasonal allergies   . Sinusitis    AXIS IV:  other psychosocial or environmental problems AXIS V:  64  Level of Care:  OP  Hospital Course:  [name] is a 32 year old white female who is admitted to [facility] dult inpatient unit after being seen in consult by Dr. Mickeal Skinner, psychiatry, during an admission in the ICU secondary to overdosing on multiple prescription medications.  [name] reports that she was becoming hormonal, her interstitial cystitis returned, she was unable to sleep, and she became very depressed and overdosed on her medications. She was then taken to the emergency department and transferred to the ICU.  She reports to this provider today that she is not  depressed or suicidal. She expresses a significant amount of anxiety. She states that she has not slept for 3 days, and she has been off of her medications that she was admitted to the hospital.    After admission assessment and evaluation, it was determined that [name] will need some treatment regimen to help combat and stabilize her current depressive symptoms. She was ordered and received Olanzapine 5 mg for mood control, Lamotrigine 25 mg for mood stabilization, Topiramate 25 mg for mood stabilization and Trazodone 50 mg for sleep. She also was enrolled in group counseling sessions and activities in which she participated actively on daily basis. She learned coping skill that if applied as instructed will help her cope better and handle her condition effectively.   As her treatment regimen progressed, it was determined that patient is responding well to her treatment plan. This is evidenced by her daily reports of improved mood, reduction of symptoms and presentation of good affects/eye contact.   Besides treatment for her depressive mood, [name] also received medication management and monitoring for her other medical issues and concerns. She tolerated her treatment regimen without any significant adverse effects and or reactions reported.  Patient attended treatment team meeting this a.m and met with treatment team members. Her reason for admission, Symptoms, treatment plan, response to treatment and discharge plans discussed. [name] endorsed that her symptoms have improved. Pt also stated that she is stable for discharge to pursue the next phase of her psychiatric care.  It was agreed upon that patient will  continue psychiatric care on outpatient basis to maintain stability. She will follow-up care at the [facility] with Dr. Vickey Sages on 09/07/12 @ 10:30 am. The address, date and time for this appointment provided for patient.  As to what patient learned from being in this  hospital, she reported that from this hospital stay, she had learned to take care of her self by staying on her medications, report any possible adverse effects to her outpatient provider and keep her appointments as scheduled with her psychiatrist. Upon discharge, patient adamantly denies suicidal, homicidal ideations, auditory, visual hallucinations and or delusional thinking. She left [facility] with all personal belongings via personal arranged transportation in no apparent distress. She was provided with 2 weeks worth supply samples of her [facility] d/c medications.  Consults:  None  Significant Diagnostic Studies:  labs: CBC with diff, CMP, UDS, Toxicology tests, U/A  Discharge Vitals:   Blood pressure 153/85, pulse 107, temperature 97.5 F (36.4 C), temperature source Oral, resp. rate 15, height 5\' 2"  (1.575 m), weight 57.607 kg (127 lb), last menstrual period 08/01/2012. Body mass index is 23.22 kg/(m^2). Lab Results:   No results found for this or any previous visit (from the past 72 hour(s)).  Physical Findings: AIMS: Facial and Oral Movements Muscles of Facial Expression: None, normal Lips and Perioral Area: None, normal Jaw: None, normal Tongue: None, normal,Extremity Movements Upper (arms, wrists, hands, fingers): None, normal Lower (legs, knees, ankles, toes): None, normal, Trunk Movements Neck, shoulders, hips: None, normal, Overall Severity Severity of abnormal movements (highest score from questions above): None, normal Incapacitation due to abnormal movements: None, normal Patient's awareness of abnormal movements (rate only patient's report): No Awareness, Dental Status Current problems with teeth and/or dentures?: No Does patient usually wear dentures?: No  CIWA:    COWS:     Psychiatric Specialty Exam: See Psychiatric Specialty Exam and Suicide Risk Assessment completed by Attending Physician prior to discharge.  Discharge destination:  Home  Is patient on multiple  antipsychotic therapies at discharge:  No   Has Patient had three or more failed trials of antipsychotic monotherapy by history:  No  Recommended Plan for Multiple Antipsychotic Therapies: NA     Medication List    STOP taking these medications       azelastine 137 MCG/SPRAY nasal spray  Commonly known as:  ASTELIN     benzonatate 100 MG capsule  Commonly known as:  TESSALON     diclofenac sodium 1 % Gel  Commonly known as:  VOLTAREN     famotidine 20 MG tablet  Commonly known as:  PEPCID     fentaNYL 25 MCG/HR  Commonly known as:  DURAGESIC - dosed mcg/hr     fish oil-omega-3 fatty acids 1000 MG capsule     fluticasone 50 MCG/ACT nasal spray  Commonly known as:  FLONASE     levocetirizine 5 MG tablet  Commonly known as:  XYZAL     LORazepam 2 MG tablet  Commonly known as:  ATIVAN     oxybutynin 15 MG 24 hr tablet  Commonly known as:  DITROPAN XL     SUMAtriptan 100 MG tablet  Commonly known as:  IMITREX     temazepam 15 MG capsule  Commonly known as:  RESTORIL      TAKE these medications     Indication   doxazosin 4 MG tablet  Commonly known as:  CARDURA  Take 1 tablet (4 mg total) by mouth at bedtime. For high  blood pressure control   Indication:  High Blood Pressure     lamoTRIgine 25 MG tablet  Commonly known as:  LAMICTAL  Take 1 tablet (25 mg total) by mouth daily. For mood stabilization   Indication:  Manic-Depression, For mood stabilization     OLANZapine 5 MG tablet  Commonly known as:  ZYPREXA  Take 1 tablet (5 mg total) by mouth at bedtime. For mood control   Indication:  Manic-Depression, Mood control     ondansetron 8 MG tablet  Commonly known as:  ZOFRAN  Take 0.5 tablets (4 mg total) by mouth every 8 (eight) hours as needed for nausea. For nausea   Indication:  Nausea     topiramate 25 MG tablet  Commonly known as:  TOPAMAX  Take 1 tablet (25 mg total) by mouth 4 (four) times daily -  with meals and at bedtime. For mood  stabilization   Indication:  Mood stabilization     traZODone 50 MG tablet  Commonly known as:  DESYREL  Take 1 tablet (50 mg total) by mouth at bedtime and may repeat dose one time if needed. For depression/sleep   Indication:  Trouble Sleeping, Major Depressive Disorder       Follow-up Information   Follow up with [facility] On 09/07/2012. (Appointment with Dr Vickey Sages at 10:30 AM on Thursday 09/07/12 at [facility])    Contact information:   [address] [place] [address] [facility] [phone] Valinda Hoar [phone]       Follow-up recommendations:  Activity:  as tolerated Other:  Keep all scheduled follow-up appointments as recommended.  Comments: Take all your medications as prescribed by your mental healthcare provider. Report any adverse effects and or reactions from your medicines to your outpatient provider promptly. Patient is instructed and cautioned to not engage in alcohol and or illegal drug use while on prescription medicines. In the event of worsening symptoms, patient is instructed to call the crisis hotline, 911 and or go to the nearest ED for appropriate evaluation and treatment of symptoms. Follow-up with your primary care provider for your other medical issues, concerns and or health care needs.   Continue to work the relapse prevention plan Total Discharge Time:  Greater than 30 minutes  Signed: Armandina Stammer I 09/05/2012, 1:40 PM

## 2012-09-05 NOTE — Progress Notes (Signed)
Patient ID: [name], female   DOB: [birth year], 32 y.o.   MRN: [id number] She has been discharged home and was picked up by her mother. Discharge instruction given to mother and pt said she under stood instructions. She denies thoughts of harming self. All belongigs taken home with her.

## 2012-09-05 NOTE — Progress Notes (Signed)
[facility] Adult Case Management Discharge Plan :  Will you be returning to the same living situation after discharge: Yes,  home to apartment with mother staying over a minimum of two nights this week. At discharge, do you have transportation home?:Yes,  mother Do you have the ability to pay for your medications:Yes,  insurance benefits  Release of information consent forms completed and in the chart;  Patient's signature needed at discharge.  Patient to Follow up at: Follow-up Information   Follow up with [facility] On 09/07/2012. (Appointment with Dr Vickey Sages at 10:30 AM on Thursday 09/07/12 at [facility])    Contact information:   [address] [place] [address] [facility] [phone] Valinda Hoar [phone]       Patient denies SI/HI:   Yes,  denies both    Safety Planning and Suicide Prevention discussed:  Yes,  with patient and patient's mother  [name] 09/05/2012, 10:29 AM

## 2012-09-05 NOTE — [facility] Suicide Risk Assessment (Signed)
Suicide Risk Assessment  Discharge Assessment     Demographic Factors:  Caucasian  Mental Status Per Nursing Assessment::   On Admission:   (denies)  Current Mental Status by Physician: In full contact with reality. There are no suicidal ideas, plans or intent. She states she is ready to go home. She is committed to continue outpatient treatment.    Loss Factors: Decline in physical health  Historical Factors: NA  Risk Reduction Factors:   Positive social support  Continued Clinical Symptoms:  Bipolar Disorder:   Depressive phase  Cognitive Features That Contribute To Risk:  Closed-mindedness Polarized thinking Thought constriction (tunnel vision)    Suicide Risk:  Minimal: No identifiable suicidal ideation.  Patients presenting with no risk factors but with morbid ruminations; may be classified as minimal risk based on the severity of the depressive symptoms  Discharge Diagnoses:   AXIS I:  Bipolar Disorder, PTSD AXIS II:  Deferred AXIS III:   Past Medical History  Diagnosis Date  . Bipolar 1 disorder   . Migraine   . Mental disorder   . Anxiety   . Interstitial cystitis   . Galactorrhea   . Chronic back pain   . Seasonal allergies   . Sinusitis    AXIS IV:  other psychosocial or environmental problems AXIS V:  61-70 mild symptoms  Plan Of Care/Follow-up recommendations:  Activity:  As tolerated Diet:  regular Will follow up outpatient basis Is patient on multiple antipsychotic therapies at discharge:  No   Has Patient had three or more failed trials of antipsychotic monotherapy by history:  No  Recommended Plan for Multiple Antipsychotic Therapies: N/Anderson   [name] 09/05/2012, 11:33 AM

## 2012-09-05 NOTE — Care Management Utilization Note (Signed)
PER STATE REGULATIONS 482.30  THIS CHART WAS REVIEWED FOR MEDICAL NECESSITY WITH RESPECT TO THE PATIENT'S ADMISSION/ DURATION OF STAY.  NEXT REVIEW DATE: 09/08/2012  

## 2012-09-05 NOTE — Progress Notes (Signed)
Psychoeducational Group Note  Date:  09/05/2012 Time:  1100  Group Topic/Focus:  Recovery Goals:   The focus of this group is to identify appropriate goals for recovery and establish a plan to achieve them.  Participation Level: Did Not Attend  Participation Quality:  Not Applicable  Affect:  Not Applicable  Cognitive:  Not Applicable  Insight:  Not Applicable  Engagement in Group: Not Applicable  Additional Comments:  Pt remained in her room during group.  Sharyn Lull 09/05/2012, 1:25 PM

## 2012-09-08 NOTE — Progress Notes (Signed)
Patient Discharge Instructions:  After Visit Summary (AVS):   Faxed to:  09/08/12 Discharge Summary Note:   Faxed to:  09/08/12 Psychiatric Admission Assessment Note:   Faxed to:  09/08/12 Suicide Risk Assessment - Discharge Assessment:   Faxed to:  09/08/12 Faxed/Sent to the Next Level Care provider:  09/08/12 Faxed to [facility] @ [phone]  Jerelene Redden, 09/08/2012, 4:02 PM

## 2012-09-27 ENCOUNTER — Encounter: Payer: Self-pay | Admitting: Neurology

## 2012-09-27 DIAGNOSIS — G2581 Restless legs syndrome: Secondary | ICD-10-CM

## 2012-10-24 ENCOUNTER — Encounter: Payer: Self-pay | Admitting: Neurology

## 2012-10-24 ENCOUNTER — Ambulatory Visit (INDEPENDENT_AMBULATORY_CARE_PROVIDER_SITE_OTHER): Payer: Medicare Other | Admitting: Neurology

## 2012-10-24 VITALS — BP 115/78 | HR 80 | Ht 62.25 in | Wt 140.0 lb

## 2012-10-24 DIAGNOSIS — G43009 Migraine without aura, not intractable, without status migrainosus: Secondary | ICD-10-CM | POA: Insufficient documentation

## 2012-10-24 MED ORDER — SUMATRIPTAN SUCCINATE 100 MG PO TABS: 100.0000 mg | ORAL_TABLET | Freq: Two times a day (BID) | ORAL | Status: DC | PRN

## 2012-10-24 NOTE — Progress Notes (Signed)
Reason for visit: Headache  [name] is an 32 y.o. female  History of present illness:  [name] is a 32 year old right-handed white female with a history of migraine headaches, bipolar disorder, and restless leg syndrome. The patient was seen in January 2014 with restless legs, and the patient indicates that this has improved since she has come off of the fentanyl patch, and Cogentin was added to her regimen. The patient is on Zyprexa in the evenings. The patient has a long-standing history of migraine headaches, and she was [facility]. The headaches have become less frequent over time. The patient uses Imitrex for her headaches which has become effective at the 50 mg dose. The patient indicates that she has one headache every 2 weeks. The patient indicates that flashing lights or fluorescent lights are activators for her headache. The patient may have photophobia and phonophobia with the headache. The patient is on Topamax for her bipolar disorder, which likely is also helping her headaches. The patient returns to the office today for an evaluation.  Past Medical History  Diagnosis Date  . Bipolar 1 disorder   . Migraine   . Mental disorder   . Anxiety   . Interstitial cystitis   . Galactorrhea   . Chronic back pain   . Seasonal allergies   . Sinusitis   . Restless leg syndrome     Past Surgical History  Procedure Laterality Date  . Tubal ligation    . Colon surgery    . Interstim implant placement    . Bowel resection  1998    prolapsed bowel  . Foot surgery      bunionectomy bil. pins put in and taken out  . Bladder surgery      distenstion numerous times    Family History  Problem Relation Age of Onset  . Hypertension Mother   . Esophageal cancer Maternal Grandmother   . Hypertension Maternal Grandmother   . Heart disease Maternal Grandmother   . Cancer - Other Maternal Grandmother     stomach  . Diabetes Maternal  Grandfather   . Stroke Maternal Grandfather   . Depression Maternal Grandfather   . Anxiety disorder Maternal Grandfather   . Hypertension Maternal Grandfather   . Heart disease Maternal Grandfather   . Heart disease Other     Social history:  reports that she has quit smoking. She has never used smokeless tobacco. She reports that she does not drink alcohol or use illicit drugs.  Allergies:  Allergies  Allergen Reactions  . Augmentin (Amoxicillin-Pot Clavulanate) Nausea And Vomiting  . Ciprofloxacin Nausea Only  . Erythromycin Nausea And Vomiting    severe  . Gabapentin Other (See Comments)    SI INTENTIONS  . Other Other (See Comments)    Medication:Urised Reaction:"hurts bladder"  . Prozac (Fluoxetine Hcl)     SI  . Pyridium (Phenazopyridine Hcl)     Increased bladder pain    Medications:  Current Outpatient Prescriptions on File Prior to Visit  Medication Sig Dispense Refill  . azelastine (ASTELIN) 137 MCG/SPRAY nasal spray Place 1 spray into the nose daily. Use in each nostril as directed      . benzonatate (TESSALON) 100 MG capsule Take 100 mg by mouth 2 (two) times daily as needed for cough.      . desipramine (NORPRAMIN) 25 MG tablet Take 25 mg by mouth at bedtime.      Marland Kitchen doxazosin (CARDURA XL) 4 MG  24 hr tablet Take 4 mg by mouth daily with breakfast.      . doxazosin (CARDURA) 4 MG tablet Take 1 tablet (4 mg total) by mouth at bedtime. For high blood pressure control      . fluticasone (FLONASE) 50 MCG/ACT nasal spray Place 1 spray into the nose daily. One spray each nostril daily      . lamoTRIgine (LAMICTAL) 25 MG tablet Take 1 tablet (25 mg total) by mouth daily. For mood stabilization  30 tablet  0  . levocetirizine (XYZAL) 5 MG tablet Take 5 mg by mouth every evening.      Marland Kitchen OLANZapine (ZYPREXA) 5 MG tablet Take 1 tablet (5 mg total) by mouth at bedtime. For mood control  30 tablet  0  . ondansetron (ZOFRAN) 8 MG tablet Take 0.5 tablets (4 mg total) by mouth  every 8 (eight) hours as needed for nausea. For nausea  20 tablet    . oxybutynin (DITROPAN XL) 15 MG 24 hr tablet Take 15 mg by mouth daily.      . temazepam (RESTORIL) 15 MG capsule Take 15 mg by mouth daily. Two capsules at night if needed      . topiramate (TOPAMAX) 25 MG tablet Take 1 tablet (25 mg total) by mouth 4 (four) times daily -  with meals and at bedtime. For mood stabilization  120 tablet  0  . traZODone (DESYREL) 50 MG tablet Take 1 tablet (50 mg total) by mouth at bedtime and may repeat dose one time if needed. For depression/sleep  30 tablet  0  . [DISCONTINUED] chlorproMAZINE (THORAZINE) 25 MG tablet Take 25 mg by mouth 3 (three) times daily.      . [DISCONTINUED] diphenhydrAMINE (BENADRYL) 25 MG tablet Take 25 mg by mouth every 6 (six) hours as needed.       No current facility-administered medications on file prior to visit.    ROS:  Out of a complete 14 system review of symptoms, the patient complains only of the following symptoms, and all other reviewed systems are negative.  Weight gain Fatigue Headache Allergies Depression, anxiety, disinterest in activities, racing thoughts Change in appetite  Blood pressure 115/78, pulse 80, height 5' 2.25" (1.581 m), weight 140 lb (63.504 kg).  Physical Exam  General: The patient is alert and cooperative at the time of the examination.  Skin: No significant peripheral edema is noted.   Neurologic Exam  Cranial nerves: Facial symmetry is present. Speech is normal, no aphasia or dysarthria is noted. Extraocular movements are full. Visual fields are full.  Motor: The patient has good strength in all 4 extremities.  Coordination: The patient has good finger-nose-finger and heel-to-shin bilaterally.  Gait and station: The patient has a normal gait. Tandem gait is normal. Romberg is negative. No drift is seen.  Reflexes: Deep tendon reflexes are symmetric.   Assessment/Plan:  1. Migraine headache  2. Restless leg  syndrome  3. Bipolar disorder  The patient is doing relatively well at this time. The patient will be given refills on her Imitrex to take if needed. The patient will followup through this office in about 6 months.  Marlan Palau MD 10/24/2012 6:32 PM  [facility] [address] [place], [place] [address]  Phone [phone] Fax [phone]

## 2012-11-06 ENCOUNTER — Encounter (HOSPITAL_COMMUNITY): Payer: Self-pay | Admitting: *Deleted

## 2012-11-06 ENCOUNTER — Emergency Department (HOSPITAL_COMMUNITY)
Admission: EM | Admit: 2012-11-06 | Discharge: 2012-11-07 | Disposition: A | Discharge status: Home / Self Care | Payer: Medicare Other | Attending: Emergency Medicine | Admitting: Emergency Medicine

## 2012-11-06 DIAGNOSIS — K429 Umbilical hernia without obstruction or gangrene: Secondary | ICD-10-CM | POA: Insufficient documentation

## 2012-11-06 DIAGNOSIS — Z8709 Personal history of other diseases of the respiratory system: Secondary | ICD-10-CM | POA: Insufficient documentation

## 2012-11-06 DIAGNOSIS — Z9851 Tubal ligation status: Secondary | ICD-10-CM | POA: Insufficient documentation

## 2012-11-06 DIAGNOSIS — Z8669 Personal history of other diseases of the nervous system and sense organs: Secondary | ICD-10-CM | POA: Insufficient documentation

## 2012-11-06 DIAGNOSIS — Z9889 Other specified postprocedural states: Secondary | ICD-10-CM | POA: Insufficient documentation

## 2012-11-06 DIAGNOSIS — G8929 Other chronic pain: Secondary | ICD-10-CM | POA: Insufficient documentation

## 2012-11-06 DIAGNOSIS — G43909 Migraine, unspecified, not intractable, without status migrainosus: Secondary | ICD-10-CM | POA: Insufficient documentation

## 2012-11-06 DIAGNOSIS — Z87448 Personal history of other diseases of urinary system: Secondary | ICD-10-CM | POA: Insufficient documentation

## 2012-11-06 DIAGNOSIS — F411 Generalized anxiety disorder: Secondary | ICD-10-CM | POA: Insufficient documentation

## 2012-11-06 DIAGNOSIS — Z8742 Personal history of other diseases of the female genital tract: Secondary | ICD-10-CM | POA: Insufficient documentation

## 2012-11-06 DIAGNOSIS — Z3202 Encounter for pregnancy test, result negative: Secondary | ICD-10-CM | POA: Insufficient documentation

## 2012-11-06 DIAGNOSIS — Z87891 Personal history of nicotine dependence: Secondary | ICD-10-CM | POA: Insufficient documentation

## 2012-11-06 DIAGNOSIS — Z79899 Other long term (current) drug therapy: Secondary | ICD-10-CM | POA: Insufficient documentation

## 2012-11-06 DIAGNOSIS — F319 Bipolar disorder, unspecified: Secondary | ICD-10-CM | POA: Insufficient documentation

## 2012-11-06 LAB — COMPREHENSIVE METABOLIC PANEL
ALT: 26 U/L (ref 0–35)
AST: 21 U/L (ref 0–37)
Albumin: 4 g/dL (ref 3.5–5.2)
Alkaline Phosphatase: 72 U/L (ref 39–117)
BUN: 11 mg/dL (ref 6–23)
CO2: 28 mEq/L (ref 19–32)
Calcium: 9.5 mg/dL (ref 8.4–10.5)
Chloride: 101 mEq/L (ref 96–112)
Creatinine, Ser: 0.88 mg/dL (ref 0.50–1.10)
GFR calc Af Amer: >90 mL/min (ref >90)
GFR calc non Af Amer: 85 mL/min — ABNORMAL LOW (ref >90)
Glucose, Bld: 95 mg/dL (ref 70–99)
Potassium: 3.7 mEq/L (ref 3.5–5.1)
Sodium: 137 mEq/L (ref 135–145)
Total Bilirubin: 0.1 mg/dL — ABNORMAL LOW (ref 0.3–1.2)
Total Protein: 6.9 g/dL (ref 6.0–8.3)

## 2012-11-06 LAB — URINALYSIS, MICROSCOPIC ONLY
Bilirubin Urine: NEGATIVE
Glucose, UA: NEGATIVE mg/dL
Hgb urine dipstick: NEGATIVE
Ketones, ur: NEGATIVE mg/dL
Leukocytes, UA: NEGATIVE
Nitrite: NEGATIVE
Protein, ur: NEGATIVE mg/dL
Specific Gravity, Urine: 1.025 (ref 1.005–1.030)
Urobilinogen, UA: 1 mg/dL (ref 0.0–1.0)
pH: 6.5 (ref 5.0–8.0)

## 2012-11-06 LAB — CBC WITH DIFFERENTIAL/PLATELET
Basophils Absolute: 0 10*3/uL (ref 0.0–0.1)
Basophils Relative: 0 % (ref 0–1)
Eosinophils Absolute: 0.4 10*3/uL (ref 0.0–0.7)
Eosinophils Relative: 5 % (ref 0–5)
HCT: 39.6 % (ref 36.0–46.0)
Hemoglobin: 13.5 g/dL (ref 12.0–15.0)
Lymphocytes Relative: 28 % (ref 12–46)
Lymphs Abs: 1.9 10*3/uL (ref 0.7–4.0)
MCH: 30.8 pg (ref 26.0–34.0)
MCHC: 34.1 g/dL (ref 30.0–36.0)
MCV: 90.2 fL (ref 78.0–100.0)
Monocytes Absolute: 0.4 10*3/uL (ref 0.1–1.0)
Monocytes Relative: 6 % (ref 3–12)
Neutro Abs: 4.1 10*3/uL (ref 1.7–7.7)
Neutrophils Relative %: 60 % (ref 43–77)
Platelets: 232 10*3/uL (ref 150–400)
RBC: 4.39 MIL/uL (ref 3.87–5.11)
RDW: 12.6 % (ref 11.5–15.5)
WBC: 6.8 10*3/uL (ref 4.0–10.5)

## 2012-11-06 LAB — POCT PREGNANCY, URINE: Preg Test, Ur: NEGATIVE

## 2012-11-06 LAB — LIPASE, BLOOD: Lipase: 48 U/L (ref 11–59)

## 2012-11-06 MED ORDER — ONDANSETRON 4 MG PO TBDP
4.0000 mg | ORAL_TABLET | Freq: Once | ORAL | Status: AC
  Administered 2012-11-06: 4 mg via ORAL
  Filled 2012-11-06: qty 1

## 2012-11-06 NOTE — ED Notes (Signed)
Pt reports that she took Tramadol upon onset of her abdominal pain this morning but it did not work, states pain still persists.

## 2012-11-06 NOTE — ED Notes (Signed)
Pt states that she was urinating last pain and felt intense abd pain around her umbilicus; pain has continued to progress today; pt states that the pain is like a star that radiated out from her "belly button"; pt states that when she leans over or applies pressure to the area the pain increase and "shoots out further" per pt.

## 2012-11-06 NOTE — ED Notes (Signed)
POCT Preg result NEG.

## 2012-11-07 ENCOUNTER — Ambulatory Visit (INDEPENDENT_AMBULATORY_CARE_PROVIDER_SITE_OTHER): Payer: Medicare Other | Admitting: General Surgery

## 2012-11-07 ENCOUNTER — Encounter (INDEPENDENT_AMBULATORY_CARE_PROVIDER_SITE_OTHER): Payer: Self-pay | Admitting: General Surgery

## 2012-11-07 VITALS — BP 110/76 | HR 78 | Temp 97.2°F | Ht 62.0 in | Wt 137.4 lb

## 2012-11-07 DIAGNOSIS — R109 Unspecified abdominal pain: Secondary | ICD-10-CM

## 2012-11-07 MED ORDER — IBUPROFEN 800 MG PO TABS: 800.0000 mg | ORAL_TABLET | Freq: Three times a day (TID) | ORAL | Status: DC | PRN

## 2012-11-07 NOTE — ED Provider Notes (Signed)
Medical screening examination/treatment/procedure(s) were performed by non-physician practitioner and as supervising physician I was immediately available for consultation/collaboration.   Hanley Seamen, MD 11/07/12 [phone]

## 2012-11-07 NOTE — Progress Notes (Signed)
Patient ID: [name], female   DOB: 1981-05-29, 32 y.o.   MRN: [id number]  Chief Complaint  Patient presents with  . New Evaluation    eval hernia    HPI [name] is a 32 y.o. female.  Referred by Dr. Read Drivers HPI This a 32 year old female with bipolar disorder. She has a prior history of what sounds like a colon resection with a rectopexy after an episode of rectal prolapse. She also has had a tubal ligation through an umbilical incision. About 2 days ago she noted pain at her umbilicus it is radiating to her sides and superiorly. She does not note a bulge or mass to be present. She has some nausea secondary to pain but has no emesis it has been eating just fine. She is passing flatus and having normal bowel movements without any change at all. She denies any fevers. She also has a history of interstitial cystitis. She comes in today to discuss a source of her pain. She was seen in the emergency room last night who felt they definitively palpated a hernia.  Past Medical History  Diagnosis Date  . Bipolar 1 disorder   . Migraine   . Mental disorder   . Anxiety   . Interstitial cystitis   . Galactorrhea   . Chronic back pain   . Seasonal allergies   . Sinusitis   . Restless leg syndrome     Past Surgical History  Procedure Laterality Date  . Tubal ligation    . Colon surgery    . Interstim implant placement    . Bowel resection  1998    prolapsed bowel  . Foot surgery      bunionectomy bil. pins put in and taken out  . Bladder surgery      distenstion numerous times    Family History  Problem Relation Age of Onset  . Hypertension Mother   . Esophageal cancer Maternal Grandmother   . Hypertension Maternal Grandmother   . Heart disease Maternal Grandmother   . Cancer - Other Maternal Grandmother     stomach  . Diabetes Maternal Grandfather   . Stroke Maternal Grandfather   . Depression Maternal Grandfather   . Anxiety disorder Maternal Grandfather   .  Hypertension Maternal Grandfather   . Heart disease Maternal Grandfather   . Heart disease Other   . Diabetes Father   . Heart disease Father   . Stroke Father     Social History History  Substance Use Topics  . Smoking status: Former Games developer  . Smokeless tobacco: Never Used  . Alcohol Use: No    Allergies  Allergen Reactions  . Augmentin (Amoxicillin-Pot Clavulanate) Nausea And Vomiting  . Ciprofloxacin Nausea Only  . Erythromycin Nausea And Vomiting    severe  . Gabapentin Other (See Comments)    SI INTENTIONS  . Other Other (See Comments)    Medication:Urised Reaction:"hurts bladder"  . Prozac (Fluoxetine Hcl)     SI  . Pyridium (Phenazopyridine Hcl)     Increased bladder pain    Current Outpatient Prescriptions  Medication Sig Dispense Refill  . benztropine (COGENTIN) 0.5 MG tablet       . doxazosin (CARDURA) 4 MG tablet Take by mouth at bedtime.      . hydrOXYzine (VISTARIL) 25 MG capsule       . ibuprofen (ADVIL,MOTRIN) 800 MG tablet Take 1 tablet (800 mg total) by mouth every 8 (eight) hours as needed for pain.  30  tablet  0  . lamoTRIgine (LAMICTAL) 25 MG tablet Take 1 tablet (25 mg total) by mouth daily. For mood stabilization  30 tablet  0  . levocetirizine (XYZAL) 5 MG tablet Take 5 mg by mouth every evening.      . nitrofurantoin (MACRODANTIN) 50 MG capsule Take 50 mg by mouth at bedtime. Started 4/26      . OLANZapine (ZYPREXA) 5 MG tablet Take 1 tablet (5 mg total) by mouth at bedtime. For mood control  30 tablet  0  . ondansetron (ZOFRAN) 8 MG tablet Take 0.5 tablets (4 mg total) by mouth every 8 (eight) hours as needed for nausea. For nausea  20 tablet    . oxybutynin (DITROPAN XL) 15 MG 24 hr tablet Take 15 mg by mouth daily.      . SUMAtriptan (IMITREX) 100 MG tablet Take 1 tablet (100 mg total) by mouth 2 (two) times daily as needed for migraine.  9 tablet  5  . topiramate (TOPAMAX) 25 MG tablet Take 1 tablet (25 mg total) by mouth 4 (four) times daily  -  with meals and at bedtime. For mood stabilization  120 tablet  0  . traMADol (ULTRAM) 50 MG tablet Take 50 mg by mouth every 8 (eight) hours as needed for pain.       . traZODone (DESYREL) 50 MG tablet Take 1 tablet (50 mg total) by mouth at bedtime and may repeat dose one time if needed. For depression/sleep  30 tablet  0  . [DISCONTINUED] chlorproMAZINE (THORAZINE) 25 MG tablet Take 25 mg by mouth 3 (three) times daily.      . [DISCONTINUED] diphenhydrAMINE (BENADRYL) 25 MG tablet Take 25 mg by mouth every 6 (six) hours as needed.       No current facility-administered medications for this visit.    Review of Systems Review of Systems  Constitutional: Negative for fever, chills and unexpected weight change.  HENT: Negative for hearing loss, congestion, sore throat, trouble swallowing and voice change.   Eyes: Negative for visual disturbance.  Respiratory: Negative for cough and wheezing.   Cardiovascular: Negative for chest pain, palpitations and leg swelling.  Gastrointestinal: Positive for abdominal pain. Negative for nausea, vomiting, diarrhea, constipation, blood in stool, abdominal distention and anal bleeding.  Genitourinary: Negative for hematuria, vaginal bleeding and difficulty urinating.  Musculoskeletal: Negative for arthralgias.  Skin: Negative for rash and wound.  Neurological: Positive for headaches. Negative for seizures and syncope.  Hematological: Negative for adenopathy. Does not bruise/bleed easily.  Psychiatric/Behavioral: Negative for confusion.    Blood pressure 110/76, pulse 78, temperature 97.2 F (36.2 C), temperature source Temporal, height 5\' 2"  (1.575 m), weight 137 lb 6.4 oz (62.324 kg), SpO2 98.00%.  Physical Exam Physical Exam  Vitals reviewed. Constitutional: She appears well-developed and well-nourished.  Cardiovascular: Normal rate, regular rhythm and normal heart sounds.   Pulmonary/Chest: Effort normal and breath sounds normal.  Abdominal:       Data Reviewed  er note  Assessment    Umbilical pain, possible hernia     Plan    The emergency room thought she had a hernia last night. I certainly do not definitively feel a hernia on her examination today. We discussed that that would require an operation I would try to complete with the laparoscope. I think given her history that it would be wise to prove this is what is going on prior to proceeding. She certainly has some tenderness at the site but has no  other real abdominal symptoms and has no symptoms of obstruction at all. I will send her to get a CT scan as soon as we can get this completed and followup with her right after that.        Monifah Freehling 11/07/2012, 4:24 PM

## 2012-11-07 NOTE — ED Provider Notes (Signed)
History     CSN: [id number]  Arrival date & time 11/06/12  1928   First MD Initiated Contact with Patient 11/07/12 [phone]      Chief Complaint  Patient presents with  . Abdominal Pain   HPI  History provided by the patient. Patient is a 32 year old female with history of bipolar disorder, interstitial cystitis and chronic back pain who presents with complaints of pain around her belly button. Pain first began early this morning and seemed to increase and be persistent throughout the day. Patient noticed it shortly after using the bathroom. Patient does state that due to her interstitial cystitis is not uncommon for her to lean forward while on the toilet and bear down forcefully. She was doing this when she started to feel pain around her umbilical area. Patient does also have a history of lower midline abdominal surgery for her tubal ligation and bowel resections. She denies having any lumps or nodules or any bulging from the abdomen area. She denies having any diarrhea or constipation symptoms. Pain radiates some but primarily stays around the umbilical area. Pain is improved at rest and worse with any bearing down on the toilet. No other aggravating or alleviating test. No other associated symptoms. No fever, chills or sweats. No diarrhea constipation. No nausea vomiting.     Past Medical History  Diagnosis Date  . Bipolar 1 disorder   . Migraine   . Mental disorder   . Anxiety   . Interstitial cystitis   . Galactorrhea   . Chronic back pain   . Seasonal allergies   . Sinusitis   . Restless leg syndrome     Past Surgical History  Procedure Laterality Date  . Tubal ligation    . Colon surgery    . Interstim implant placement    . Bowel resection  1998    prolapsed bowel  . Foot surgery      bunionectomy bil. pins put in and taken out  . Bladder surgery      distenstion numerous times    Family History  Problem Relation Age of Onset  . Hypertension Mother   .  Esophageal cancer Maternal Grandmother   . Hypertension Maternal Grandmother   . Heart disease Maternal Grandmother   . Cancer - Other Maternal Grandmother     stomach  . Diabetes Maternal Grandfather   . Stroke Maternal Grandfather   . Depression Maternal Grandfather   . Anxiety disorder Maternal Grandfather   . Hypertension Maternal Grandfather   . Heart disease Maternal Grandfather   . Heart disease Other     History  Substance Use Topics  . Smoking status: Former Games developer  . Smokeless tobacco: Never Used  . Alcohol Use: No    OB History   Grav Para Term Preterm Abortions TAB SAB Ect Mult Living   0               Review of Systems  Constitutional: Negative for fever, chills and diaphoresis.  Gastrointestinal: Positive for abdominal pain. Negative for nausea, vomiting, diarrhea, constipation and blood in stool.  Genitourinary: Negative for dysuria, frequency, hematuria and flank pain.  All other systems reviewed and are negative.    Allergies  Augmentin; Ciprofloxacin; Erythromycin; Gabapentin; Other; Prozac; and Pyridium  Home Medications   Current Outpatient Rx  Name  Route  Sig  Dispense  Refill  . benztropine (COGENTIN) 0.5 MG tablet               .  doxazosin (CARDURA) 4 MG tablet   Oral   Take by mouth at bedtime.         . hydrOXYzine (VISTARIL) 25 MG capsule               . lamoTRIgine (LAMICTAL) 25 MG tablet   Oral   Take 1 tablet (25 mg total) by mouth daily. For mood stabilization   30 tablet   0   . levocetirizine (XYZAL) 5 MG tablet   Oral   Take 5 mg by mouth every evening.         . nitrofurantoin (MACRODANTIN) 50 MG capsule   Oral   Take 50 mg by mouth at bedtime. Started 4/26         . OLANZapine (ZYPREXA) 5 MG tablet   Oral   Take 1 tablet (5 mg total) by mouth at bedtime. For mood control   30 tablet   0   . ondansetron (ZOFRAN) 8 MG tablet   Oral   Take 0.5 tablets (4 mg total) by mouth every 8 (eight) hours as  needed for nausea. For nausea   20 tablet      . oxybutynin (DITROPAN XL) 15 MG 24 hr tablet   Oral   Take 15 mg by mouth daily.         . SUMAtriptan (IMITREX) 100 MG tablet   Oral   Take 1 tablet (100 mg total) by mouth 2 (two) times daily as needed for migraine.   9 tablet   5   . topiramate (TOPAMAX) 25 MG tablet   Oral   Take 1 tablet (25 mg total) by mouth 4 (four) times daily -  with meals and at bedtime. For mood stabilization   120 tablet   0   . traMADol (ULTRAM) 50 MG tablet   Oral   Take 50 mg by mouth every 8 (eight) hours as needed for pain.          . traZODone (DESYREL) 50 MG tablet   Oral   Take 1 tablet (50 mg total) by mouth at bedtime and may repeat dose one time if needed. For depression/sleep   30 tablet   0     BP 113/71  Pulse 73  Temp(Src) 97.5 F (36.4 C) (Oral)  Resp 18  SpO2 98%  Physical Exam  Nursing note and vitals reviewed. Constitutional: She is oriented to person, place, and time. She appears well-developed and well-nourished. No distress.  HENT:  Head: Normocephalic.  Cardiovascular: Normal rate and regular rhythm.   Pulmonary/Chest: Effort normal and breath sounds normal.  Abdominal: Soft. She exhibits no distension. There is no rebound and no guarding. A hernia is present.  Patient with very specific point tenderness around the umbilical area. There is an inferior midline abdominal surgical scar consistent with history of prior surgeries. Near the umbilicus and top part of the scar there is a 1-2 cm herniation without any bulging mass. This is tender. No signs concerning for incarceration.  Neurological: She is alert and oriented to person, place, and time.  Skin: Skin is warm and dry. No rash noted.  Psychiatric: She has a normal mood and affect. Her behavior is normal.    ED Course  Procedures         1. Umbilical hernia       MDM  1:50AM patient seen and evaluated. Patient appears well in no acute  distress. She moves about the bed normally. Abdomen appears benign. Does  appear to be small abdominal hernia near the umbilicus and top part of the surgical scar. signs concerning for incarceration. At this time we'll give referral for anti-inflammatories and refer to general surgeon.        Angus Seller, PA-C 11/07/12 [phone]

## 2012-11-08 ENCOUNTER — Other Ambulatory Visit (INDEPENDENT_AMBULATORY_CARE_PROVIDER_SITE_OTHER): Payer: Self-pay | Admitting: General Surgery

## 2012-11-08 ENCOUNTER — Telehealth (INDEPENDENT_AMBULATORY_CARE_PROVIDER_SITE_OTHER): Payer: Self-pay

## 2012-11-08 ENCOUNTER — Encounter (INDEPENDENT_AMBULATORY_CARE_PROVIDER_SITE_OTHER): Payer: Self-pay | Admitting: Surgery

## 2012-11-08 ENCOUNTER — Ambulatory Visit (INDEPENDENT_AMBULATORY_CARE_PROVIDER_SITE_OTHER): Payer: Medicare Other | Admitting: Surgery

## 2012-11-08 ENCOUNTER — Ambulatory Visit
Admission: RE | Admit: 2012-11-08 | Discharge: 2012-11-08 | Disposition: A | Discharge status: Home / Self Care | Payer: Medicare Other | Source: Ambulatory Visit | Attending: General Surgery | Admitting: General Surgery

## 2012-11-08 VITALS — BP 110/78 | HR 78 | Temp 97.6°F | Resp 18 | Ht 62.0 in | Wt 140.0 lb

## 2012-11-08 DIAGNOSIS — R109 Unspecified abdominal pain: Secondary | ICD-10-CM

## 2012-11-08 MED ORDER — IOHEXOL 300 MG/ML  SOLN
100.0000 mL | Freq: Once | INTRAMUSCULAR | Status: AC | PRN
  Administered 2012-11-08: 100 mL via INTRAVENOUS

## 2012-11-08 MED ORDER — CEPHALEXIN 500 MG PO CAPS: 500.0000 mg | ORAL_CAPSULE | Freq: Three times a day (TID) | ORAL | Status: AC

## 2012-11-08 NOTE — Patient Instructions (Signed)
We will start you on some antibiotics to see if this improves the drainage from the umbilical area. Will obtain a gallbladder ultrasound to see if some of her nausea is related to the gallstones. I think youwillneed a gastroenterologist to evaluate you for your chronic GI symptoms.

## 2012-11-08 NOTE — Progress Notes (Signed)
Chief complaint: Abdominal pain  History of present illness: This patient was seen in the emergency department and followed up in our office yesterday by Dr. Emelia Loron. This soft tissue might have an umbilical hernia causing abdominal pain. She has persistent pain in a bandlike area across her umbilicus extending to the right and left side. She's also having continued problems with nausea.Dr. Dwain Sarna ordered a CT scan which was done this morning. The patient has now noticed some "pus" draining from her umbilicus as well as some blood. The pain has continued.she came to the urgent office today for followup.  Past surgical history etc. Are all in the electronic medical record and reviewed  Exam Vital signs:BP 110/78  Pulse 78  Temp(Src) 97.6 F (36.4 C) (Temporal)  Resp 18  Ht 5\' 2"  (1.575 m)  Wt 140 lb (63.504 kg)  BMI 25.6 kg/m2 General: The patient is alert oriented generally healthy-appearing somewhat anxious Abdomen: Soft, not tender, not distended no obvious masses or organomegaly. There is not appear to be an umbilical hernia present. There is what looks like a little bit of dry blood in the umbilicus. Upon cleaning out all of his apparently she has a very deep umbilicus and at the very bottom there may be a tiny open area perhaps a millimeter with some clear fluid noted. There is no erythema or surrounding evidence of infection currently.  Impression: Abdominal pain with nausea of uncertain etiology with no evidence by CT scan of the local hernia or other acute process; drainage from the umbilical area, perhaps some low-grade infectious process or even a retained suture from her prior lower midline incision which extends to the base the umbilicus.  Plan: I will start her on Keflex 500 3 times a day. Will check a gallbladder ultrasound to be sure her nausea is not biliary related. If that's negative I think she needs some GI evaluation but appears to number for problems have been  chronic. It may be necessary at some point to do an umbilical exploration to see if there is something at the base umbilicus causing her drainage but we'll see if the antibiotics improve her situation.

## 2012-11-08 NOTE — Telephone Encounter (Signed)
Called [name] to notify her that Dr Dwain Sarna reviewed her CT results which were negative and so was the labwork. The [name] does not have anything surgical at this time so she really needs to f/u with her medical doctor. The [name] understands.

## 2012-11-13 ENCOUNTER — Ambulatory Visit
Admission: RE | Admit: 2012-11-13 | Discharge: 2012-11-13 | Disposition: A | Discharge status: Home / Self Care | Payer: Medicare Other | Source: Ambulatory Visit | Attending: Surgery | Admitting: Surgery

## 2012-11-13 ENCOUNTER — Telehealth (INDEPENDENT_AMBULATORY_CARE_PROVIDER_SITE_OTHER): Payer: Self-pay | Admitting: General Surgery

## 2012-11-13 DIAGNOSIS — R109 Unspecified abdominal pain: Secondary | ICD-10-CM

## 2012-11-13 NOTE — Telephone Encounter (Signed)
Message copied by Liliana Cline on Mon Nov 13, 2012  9:07 AM ------      Message from: Currie Paris      Created: Mon Nov 13, 2012  9:00 AM       Let her know that the ultrasound is normal and no etiology for her abdominal pain ------

## 2012-11-13 NOTE — Telephone Encounter (Signed)
Tried to call patient to make her aware ultrasound normal. Her mail box is full and I can not leave a message. Will try again later.

## 2012-11-13 NOTE — Telephone Encounter (Signed)
Patient made aware ultrasound normal. Advised her to see her PCP or have her PCP refer her to a gastroenterologist. She will call us if she needs anything surgical.

## 2012-11-14 ENCOUNTER — Encounter: Payer: Self-pay | Admitting: Internal Medicine

## 2012-11-16 ENCOUNTER — Encounter: Payer: Self-pay | Admitting: *Deleted

## 2013-01-16 ENCOUNTER — Ambulatory Visit: Payer: Medicare Other | Admitting: Internal Medicine

## 2013-03-19 ENCOUNTER — Emergency Department (HOSPITAL_COMMUNITY)
Admission: EM | Admit: 2013-03-19 | Discharge: 2013-03-19 | Disposition: A | Discharge status: Home / Self Care | Payer: Medicare Other | Attending: Emergency Medicine | Admitting: Emergency Medicine

## 2013-03-19 ENCOUNTER — Encounter (HOSPITAL_COMMUNITY): Payer: Self-pay | Admitting: *Deleted

## 2013-03-19 DIAGNOSIS — R197 Diarrhea, unspecified: Secondary | ICD-10-CM | POA: Insufficient documentation

## 2013-03-19 DIAGNOSIS — Z9851 Tubal ligation status: Secondary | ICD-10-CM | POA: Insufficient documentation

## 2013-03-19 DIAGNOSIS — Z87891 Personal history of nicotine dependence: Secondary | ICD-10-CM | POA: Insufficient documentation

## 2013-03-19 DIAGNOSIS — Z8742 Personal history of other diseases of the female genital tract: Secondary | ICD-10-CM | POA: Insufficient documentation

## 2013-03-19 DIAGNOSIS — F319 Bipolar disorder, unspecified: Secondary | ICD-10-CM | POA: Insufficient documentation

## 2013-03-19 DIAGNOSIS — G43909 Migraine, unspecified, not intractable, without status migrainosus: Secondary | ICD-10-CM | POA: Insufficient documentation

## 2013-03-19 DIAGNOSIS — F411 Generalized anxiety disorder: Secondary | ICD-10-CM | POA: Insufficient documentation

## 2013-03-19 DIAGNOSIS — Z8719 Personal history of other diseases of the digestive system: Secondary | ICD-10-CM | POA: Insufficient documentation

## 2013-03-19 DIAGNOSIS — R6883 Chills (without fever): Secondary | ICD-10-CM | POA: Insufficient documentation

## 2013-03-19 DIAGNOSIS — M549 Dorsalgia, unspecified: Secondary | ICD-10-CM | POA: Insufficient documentation

## 2013-03-19 DIAGNOSIS — IMO0001 Reserved for inherently not codable concepts without codable children: Secondary | ICD-10-CM | POA: Insufficient documentation

## 2013-03-19 DIAGNOSIS — Z79899 Other long term (current) drug therapy: Secondary | ICD-10-CM | POA: Insufficient documentation

## 2013-03-19 DIAGNOSIS — Z8709 Personal history of other diseases of the respiratory system: Secondary | ICD-10-CM | POA: Insufficient documentation

## 2013-03-19 DIAGNOSIS — R5381 Other malaise: Secondary | ICD-10-CM | POA: Insufficient documentation

## 2013-03-19 DIAGNOSIS — G8929 Other chronic pain: Secondary | ICD-10-CM | POA: Insufficient documentation

## 2013-03-19 DIAGNOSIS — N3010 Interstitial cystitis (chronic) without hematuria: Secondary | ICD-10-CM | POA: Insufficient documentation

## 2013-03-19 DIAGNOSIS — Z3202 Encounter for pregnancy test, result negative: Secondary | ICD-10-CM | POA: Insufficient documentation

## 2013-03-19 LAB — URINE MICROSCOPIC-ADD ON

## 2013-03-19 LAB — URINALYSIS, ROUTINE W REFLEX MICROSCOPIC
Bilirubin Urine: NEGATIVE
Glucose, UA: NEGATIVE mg/dL
Ketones, ur: 40 mg/dL — AB
Leukocytes, UA: NEGATIVE
Nitrite: NEGATIVE
Protein, ur: 30 mg/dL — AB
Specific Gravity, Urine: 1.034 — ABNORMAL HIGH (ref 1.005–1.030)
Urobilinogen, UA: 1 mg/dL (ref 0.0–1.0)
pH: 5.5 (ref 5.0–8.0)

## 2013-03-19 LAB — PREGNANCY, URINE: Preg Test, Ur: NEGATIVE

## 2013-03-19 MED ORDER — HYDROCODONE-ACETAMINOPHEN 5-325 MG PO TABS: 1.0000 | ORAL_TABLET | ORAL | Status: DC | PRN

## 2013-03-19 MED ORDER — METOCLOPRAMIDE HCL 10 MG PO TABS: 10.0000 mg | ORAL_TABLET | Freq: Four times a day (QID) | ORAL | Status: DC

## 2013-03-19 MED ORDER — MORPHINE SULFATE 4 MG/ML IJ SOLN
4.0000 mg | Freq: Once | INTRAMUSCULAR | Status: AC
  Administered 2013-03-19: 4 mg via INTRAVENOUS
  Filled 2013-03-19: qty 1

## 2013-03-19 MED ORDER — HYDROCODONE-ACETAMINOPHEN 5-325 MG PO TABS
2.0000 | ORAL_TABLET | Freq: Once | ORAL | Status: AC
  Administered 2013-03-19: 2 via ORAL
  Filled 2013-03-19: qty 2

## 2013-03-19 NOTE — ED Provider Notes (Signed)
CSN: [id number]     Arrival date & time 03/19/13  0108 History   First MD Initiated Contact with Patient 03/19/13 0120     Chief Complaint  Patient presents with  . Cystitis   (Consider location/radiation/quality/duration/timing/severity/associated sxs/prior Treatment) HPI History provided by pt.  Pt complains of IC exacerbation x 3 days.  Has had severe, constant, non-radiating suprapubic pain since her "PMS" started.  Describes PMS as period of time before menstrual cycle starts that she experiences hot flashes and chills.  Has had IC for 10 years and exacerbations have always occurred with PMS, but symptoms have been controlled for the past 4 years since receiving the Mirena IUD.  Current symptoms are typical with the exception that she has associated N/V.   Has had body aches and diarrhea as well which is normal.  Denies fever, dysuria and vaginal sx.  Is taking vicodin, prescribed for toothache, w/out any relief of abdominal pain.  Past abd surgeries include bowel resection.  Otherwise healthy. Past Medical History  Diagnosis Date  . Bipolar 1 disorder   . Migraine   . Mental disorder   . Anxiety   . Interstitial cystitis   . Galactorrhea   . Chronic back pain   . Seasonal allergies   . Sinusitis   . Restless leg syndrome   . IBS (irritable bowel syndrome)   . Fibromyalgia    Past Surgical History  Procedure Laterality Date  . Tubal ligation    . Bunionectomy    . Interstim implant placement    . Bowel resection  1998    prolapsed bowel  . Foot surgery      bunionectomy bil. pins put in and taken out  . Bladder surgery      distenstion numerous times   Family History  Problem Relation Age of Onset  . Hypertension Mother   . Esophageal cancer Maternal Grandmother   . Hypertension Maternal Grandmother   . Heart disease Maternal Grandmother   . Stomach cancer Maternal Grandmother   . Diabetes Maternal Grandfather   . Stroke Maternal Grandfather   . Depression Maternal  Grandfather   . Anxiety disorder Maternal Grandfather   . Hypertension Maternal Grandfather   . Heart disease Maternal Grandfather   . Heart disease Other   . Diabetes Father   . Heart disease Father   . Stroke Father    History  Substance Use Topics  . Smoking status: Former Games developer  . Smokeless tobacco: Never Used  . Alcohol Use: No   OB History   Grav Para Term Preterm Abortions TAB SAB Ect Mult Living   0              Review of Systems  All other systems reviewed and are negative.    Allergies  Augmentin; Ciprofloxacin; Erythromycin; Gabapentin; Other; Prozac; and Pyridium  Home Medications   Current Outpatient Rx  Name  Route  Sig  Dispense  Refill  . benztropine (COGENTIN) 0.5 MG tablet               . doxazosin (CARDURA) 4 MG tablet   Oral   Take by mouth at bedtime.         . hydrOXYzine (VISTARIL) 25 MG capsule               . ibuprofen (ADVIL,MOTRIN) 800 MG tablet   Oral   Take 1 tablet (800 mg total) by mouth every 8 (eight) hours as needed for pain.  30 tablet   0   . lamoTRIgine (LAMICTAL) 25 MG tablet   Oral   Take 1 tablet (25 mg total) by mouth daily. For mood stabilization   30 tablet   0   . levocetirizine (XYZAL) 5 MG tablet   Oral   Take 5 mg by mouth every evening.         . nitrofurantoin (MACRODANTIN) 50 MG capsule   Oral   Take 50 mg by mouth at bedtime. Started 4/26         . OLANZapine (ZYPREXA) 5 MG tablet   Oral   Take 1 tablet (5 mg total) by mouth at bedtime. For mood control   30 tablet   0   . ondansetron (ZOFRAN) 8 MG tablet   Oral   Take 0.5 tablets (4 mg total) by mouth every 8 (eight) hours as needed for nausea. For nausea   20 tablet      . oxybutynin (DITROPAN XL) 15 MG 24 hr tablet   Oral   Take 15 mg by mouth daily.         . SUMAtriptan (IMITREX) 100 MG tablet   Oral   Take 1 tablet (100 mg total) by mouth 2 (two) times daily as needed for migraine.   9 tablet   5   . topiramate  (TOPAMAX) 25 MG tablet   Oral   Take 1 tablet (25 mg total) by mouth 4 (four) times daily -  with meals and at bedtime. For mood stabilization   120 tablet   0   . traMADol (ULTRAM) 50 MG tablet   Oral   Take 50 mg by mouth every 8 (eight) hours as needed for pain.          . traZODone (DESYREL) 50 MG tablet   Oral   Take 1 tablet (50 mg total) by mouth at bedtime and may repeat dose one time if needed. For depression/sleep   30 tablet   0    BP 113/84  Pulse 98  Temp(Src) 98.2 F (36.8 C) (Oral)  Resp 18  SpO2 99%  LMP 02/23/2013 Physical Exam  Nursing note and vitals reviewed. Constitutional: She is oriented to person, place, and time. She appears well-developed and well-nourished. No distress.  HENT:  Head: Normocephalic and atraumatic.  Eyes:  Normal appearance  Neck: Normal range of motion.  Cardiovascular: Normal rate and regular rhythm.   Pulmonary/Chest: Effort normal and breath sounds normal. No respiratory distress.  Abdominal: Soft. Bowel sounds are normal. She exhibits no distension and no mass. There is no rebound and no guarding.  Vertical surgical scar inferior to umbilicus.  Diffuse lower abdominal and suprapubic ttp.   Genitourinary:  Mild R CVA tenderness  Musculoskeletal: Normal range of motion.  Neurological: She is alert and oriented to person, place, and time.  Skin: Skin is warm and dry. No rash noted.  Psychiatric: She has a normal mood and affect. Her behavior is normal.    ED Course  Procedures (including critical care time) Labs Review Labs Reviewed  URINALYSIS, ROUTINE W REFLEX MICROSCOPIC - Abnormal; Notable for the following:    Color, Urine AMBER (*)    APPearance CLOUDY (*)    Specific Gravity, Urine 1.034 (*)    Hgb urine dipstick MODERATE (*)    Ketones, ur 40 (*)    Protein, ur 30 (*)    All other components within normal limits  URINE MICROSCOPIC-ADD ON - Abnormal; Notable for the following:  Squamous Epithelial / LPF  MANY (*)    Bacteria, UA MANY (*)    Crystals CA OXALATE CRYSTALS (*)    All other components within normal limits  URINE CULTURE  PREGNANCY, URINE   Imaging Review No results found.  MDM   1. Interstitial cystitis    31yo F presents w/ suprapubic pain and vomiting x 3 days; pain typical of IC exacerbations but not usually associated w/ N/V.  On exam, afebrile, non-toxic appearing, abd soft/non-distended, diffuse, mild lower abd and suprapubic ttp. She requested 4mg  IM morphine for pain.  U/A and urine pregnancy pending.  Will reassess shortly.  1:58 AM   Pain improved.  U/A was not a clean catch.  Sent for culture.  Pt prescribed 15 more vicodin as well as reglan.  Return precautions discussed. 3:24 AM   Otilio Miu, PA-C 03/19/13 [phone]

## 2013-03-19 NOTE — ED Notes (Addendum)
Pt states she usually has inflammation around menstrual cycle of cystitis. Cystitis x 14years. Has IUD   Sx's of Vomiting d/t increase amount of pain, denies nausea now. Has intermittant Hot flashes has progressed over 4 days. Really bad acute onset of pain tonight. States she feels crippled from the pain.    States she has taken Hydrocodone and Tramadol, but not helping tonight.   Denies menstrual bleeding or "white stuff in urine"

## 2013-03-19 NOTE — ED Notes (Signed)
Pt has history of cystitis, flare up related to cycle, states it has 4 years since a flair up.

## 2013-03-19 NOTE — ED Provider Notes (Signed)
Medical screening examination/treatment/procedure(s) were performed by non-physician practitioner and as supervising physician I was immediately available for consultation/collaboration.   Junius Argyle, MD 03/19/13 [phone]

## 2013-03-20 LAB — URINE CULTURE: Colony Count: 55000

## 2013-03-21 NOTE — Progress Notes (Signed)
ED Antimicrobial Stewardship Positive Culture Follow Up   [name] is an 32 y.o. female who presented to [facility] on 03/19/2013 with a chief complaint of  Chief Complaint  Patient presents with  . Cystitis    Recent Results (from the past 720 hour(s))  URINE CULTURE     Status: None   Collection Time    03/19/13  3:15 AM      Result Value Range Status   Specimen Description URINE, RANDOM   Final   Special Requests NONE   Final   Culture  Setup Time     Final   Value: 03/19/2013 11:09     Performed at [facility] Count     Final   Value: 55,000 COLONIES/ML     Performed at [facility]   Culture     Final   Value: YEAST     Performed at [facility]   Report Status 03/20/2013 FINAL   Final   Patient was discharged without antimicrobial therapy. [name] recommends for patient to return to ED for repeat UA if she is still symptomatic (sample may have been contaminated).  ED Provider: Johnnette Gourd, PA-C   Cleon Dew 03/21/2013, 4:53 PM Infectious Diseases Pharmacist Phone# [phone]

## 2013-03-22 ENCOUNTER — Telehealth (HOSPITAL_COMMUNITY): Payer: Self-pay | Admitting: *Deleted

## 2013-03-22 NOTE — ED Notes (Signed)
Post ED Visit - Positive Culture Follow-up: Successful Patient Follow-Up  Culture assessed and recommendations reviewed by: [x]  Wes Dulaney, Pharm.D., BCPS []  Celedonio Miyamoto, Pharm.D., BCPS []  Georgina Pillion, Pharm.D., BCPS []  [place], [place].D., BCPS, AAHIVP []  Estella Husk, Pharm.D., BCPS, AAHIVP  Positive Urine culture  []  Patient discharged without antimicrobial prescription and treatment is now indicated [x]  Organism is resistant to prescribed ED discharge antimicrobial []  Patient with positive blood cultures  Changes discussed with ED provider: Johnnette Gourd Return for recheck if symptomatic  Larena Sox 03/22/2013, 12:37 PM

## 2013-03-22 NOTE — ED Notes (Signed)
Patient is feeling fine

## 2013-03-26 ENCOUNTER — Emergency Department (INDEPENDENT_AMBULATORY_CARE_PROVIDER_SITE_OTHER)
Admission: EM | Admit: 2013-03-26 | Discharge: 2013-03-26 | Disposition: A | Discharge status: Home / Self Care | Payer: Medicare Other | Source: Home / Self Care | Attending: Family Medicine | Admitting: Family Medicine

## 2013-03-26 ENCOUNTER — Encounter (HOSPITAL_COMMUNITY): Payer: Self-pay | Admitting: Emergency Medicine

## 2013-03-26 DIAGNOSIS — T50905A Adverse effect of unspecified drugs, medicaments and biological substances, initial encounter: Secondary | ICD-10-CM

## 2013-03-26 DIAGNOSIS — T887XXA Unspecified adverse effect of drug or medicament, initial encounter: Secondary | ICD-10-CM

## 2013-03-26 NOTE — ED Provider Notes (Signed)
CSN: [id number]     Arrival date & time 03/26/13  1925 History   First MD Initiated Contact with Patient 03/26/13 1940     Chief Complaint  Patient presents with  . Nausea   (Consider location/radiation/quality/duration/timing/severity/associated sxs/prior Treatment) Patient is a 32 y.o. female presenting with mental health disorder. The history is provided by the patient and a parent.  Mental Health Problem Presenting symptoms: agitation   Presenting symptoms comment:  Pt restless and nervous from medications, currently with no pain or problem, on a multitude of meds. Associated symptoms: anxiety     Past Medical History  Diagnosis Date  . Bipolar 1 disorder   . Migraine   . Mental disorder   . Anxiety   . Interstitial cystitis   . Galactorrhea   . Chronic back pain   . Seasonal allergies   . Sinusitis   . Restless leg syndrome   . IBS (irritable bowel syndrome)   . Fibromyalgia    Past Surgical History  Procedure Laterality Date  . Tubal ligation    . Bunionectomy    . Interstim implant placement    . Bowel resection  1998    prolapsed bowel  . Foot surgery      bunionectomy bil. pins put in and taken out  . Bladder surgery      distenstion numerous times   Family History  Problem Relation Age of Onset  . Hypertension Mother   . Esophageal cancer Maternal Grandmother   . Hypertension Maternal Grandmother   . Heart disease Maternal Grandmother   . Stomach cancer Maternal Grandmother   . Diabetes Maternal Grandfather   . Stroke Maternal Grandfather   . Depression Maternal Grandfather   . Anxiety disorder Maternal Grandfather   . Hypertension Maternal Grandfather   . Heart disease Maternal Grandfather   . Heart disease Other   . Diabetes Father   . Heart disease Father   . Stroke Father    History  Substance Use Topics  . Smoking status: Former Games developer  . Smokeless tobacco: Never Used  . Alcohol Use: No   OB History   Grav Para Term Preterm Abortions  TAB SAB Ect Mult Living   0              Review of Systems  Constitutional: Negative.   Respiratory: Negative.   Cardiovascular: Negative.   Gastrointestinal: Negative.   Psychiatric/Behavioral: Positive for agitation. The patient is nervous/anxious and is hyperactive.     Allergies  Augmentin; Ciprofloxacin; Erythromycin; Gabapentin; Other; Prozac; and Pyridium  Home Medications   Current Outpatient Rx  Name  Route  Sig  Dispense  Refill  . doxazosin (CARDURA) 4 MG tablet   Oral   Take by mouth at bedtime.         Marland Kitchen HYDROcodone-acetaminophen (NORCO/VICODIN) 5-325 MG per tablet   Oral   Take 1-2 tablets by mouth every 6 (six) hours as needed for pain.         Marland Kitchen HYDROcodone-acetaminophen (NORCO/VICODIN) 5-325 MG per tablet   Oral   Take 1 tablet by mouth every 4 (four) hours as needed for pain.   15 tablet   0   . levocetirizine (XYZAL) 5 MG tablet   Oral   Take 5 mg by mouth every evening.         . lurasidone (LATUDA) 40 MG TABS tablet   Oral   Take 40 mg by mouth 2 (two) times daily.         Marland Kitchen  metoCLOPramide (REGLAN) 10 MG tablet   Oral   Take 1 tablet (10 mg total) by mouth every 6 (six) hours.   20 tablet   0   . nitrofurantoin (MACRODANTIN) 50 MG capsule   Oral   Take 50 mg by mouth at bedtime. Started 4/26         . ondansetron (ZOFRAN) 8 MG tablet   Oral   Take 0.5 tablets (4 mg total) by mouth every 8 (eight) hours as needed for nausea. For nausea   20 tablet      . SUMAtriptan (IMITREX) 100 MG tablet   Oral   Take 1 tablet (100 mg total) by mouth 2 (two) times daily as needed for migraine.   9 tablet   5   . traMADol (ULTRAM) 50 MG tablet   Oral   Take 50 mg by mouth every 8 (eight) hours as needed for pain.          . traZODone (DESYREL) 50 MG tablet   Oral   Take 1 tablet (50 mg total) by mouth at bedtime and may repeat dose one time if needed. For depression/sleep   30 tablet   0   . zonisamide (ZONEGRAN) 100 MG  capsule   Oral   Take 100 mg by mouth 2 (two) times daily.          BP 117/83  Pulse 81  Temp(Src) 98.3 F (36.8 C) (Oral)  Resp 20  SpO2 99%  LMP 02/23/2013 Physical Exam  Nursing note and vitals reviewed. Constitutional: She is oriented to person, place, and time. She appears well-developed and well-nourished.  Neck: Normal range of motion. Neck supple.  Cardiovascular: Regular rhythm.   Abdominal: Soft. Bowel sounds are normal. There is no tenderness.  Neurological: She is alert and oriented to person, place, and time.  Skin: Skin is warm and dry.    ED Course  Procedures (including critical care time) Labs Review Labs Reviewed - No data to display Imaging Review No results found.  MDM      Linna Hoff, MD 03/26/13 [phone]

## 2013-03-26 NOTE — ED Notes (Signed)
C/o nausea and sweats/chills.  Patient states she feels anxious that's it is painful

## 2013-05-03 ENCOUNTER — Encounter: Payer: Self-pay | Admitting: Nurse Practitioner

## 2013-05-03 ENCOUNTER — Ambulatory Visit (INDEPENDENT_AMBULATORY_CARE_PROVIDER_SITE_OTHER): Payer: Medicare Other | Admitting: Nurse Practitioner

## 2013-05-03 ENCOUNTER — Encounter (INDEPENDENT_AMBULATORY_CARE_PROVIDER_SITE_OTHER): Payer: Self-pay

## 2013-05-03 VITALS — BP 108/83 | HR 94 | Ht 63.0 in | Wt 139.0 lb

## 2013-05-03 DIAGNOSIS — G43009 Migraine without aura, not intractable, without status migrainosus: Secondary | ICD-10-CM

## 2013-05-03 MED ORDER — SUMATRIPTAN SUCCINATE 100 MG PO TABS: 100.0000 mg | ORAL_TABLET | Freq: Two times a day (BID) | ORAL | Status: DC | PRN

## 2013-05-03 NOTE — Patient Instructions (Addendum)
Continue sumatriptan when necessary acute migraine Given information on migraine triggers Followup in 6 months

## 2013-05-03 NOTE — Progress Notes (Signed)
[facility]  PATIENT: [name] DOB: 05-18-1981   REASON FOR VISIT: Followup for headache   HISTORY OF PRESENT ILLNESS: Ms. Stiner 32 year old right-handed white female with a history of migraine headaches, bipolar disorder, and restless leg syndrome returns for followup. He was last in the office by Dr. Anne Hahn on 10/24/2012. She was  on Topamax at that time but is now off that medication. She has a headache about every 2 weeks relieved with Imitrex. She returns today for evaluation and medication refills.     HISTORY: The patient was seen in January 2014 with restless legs, and the patient indicates that this has improved since she has come off of the fentanyl patch, and Cogentin was added to her regimen. The patient is on Zyprexa in the evenings. The patient has a long-standing history of migraine headaches, and [facility]. The headaches have become less frequent over time. The patient uses Imitrex for her headaches which has become less effective at the 50 mg dose. The patient indicates that she has one headache every 2 weeks. The patient indicates that flashing lights or fluorescent lights are activators for her headache. The patient may have photophobia and phonophobia with the headache.     REVIEW OF SYSTEMS: Full 14 system review of systems performed and notable only for:  Constitutional: Weight loss, fatigue Cardiovascular: N/A  Ear/Nose/Throat: N/A  Skin: N/A  Eyes: N/A  Respiratory: N/A  Gastroitestinal: N/A  Hematology/Lymphatic: N/A  Endocrine: N/A Musculoskeletal:N/A  Allergy/Immunology: Allergies  Neurological: N/A Psychiatric: Depression, anxiety, too much sleep, insomnia, disinterest in activities  ALLERGIES: Allergies  Allergen Reactions  . Augmentin [Amoxicillin-Pot Clavulanate] Nausea And Vomiting  . Ciprofloxacin Nausea Only  . Erythromycin Nausea And Vomiting    severe  . Gabapentin Other  (See Comments)    SI INTENTIONS  . Other Other (See Comments)    Medication:Urised Reaction:"hurts bladder"  . Prozac [Fluoxetine Hcl]     SI  . Pyridium [Phenazopyridine Hcl]     Increased bladder pain    HOME MEDICATIONS: Outpatient Prescriptions Prior to Visit  Medication Sig Dispense Refill  . doxazosin (CARDURA) 4 MG tablet Take by mouth at bedtime.      Marland Kitchen levocetirizine (XYZAL) 5 MG tablet Take 5 mg by mouth every evening.      . nitrofurantoin (MACRODANTIN) 50 MG capsule Take 50 mg by mouth at bedtime. Started 4/26      . ondansetron (ZOFRAN) 8 MG tablet Take 0.5 tablets (4 mg total) by mouth every 8 (eight) hours as needed for nausea. For nausea  20 tablet    . SUMAtriptan (IMITREX) 100 MG tablet Take 1 tablet (100 mg total) by mouth 2 (two) times daily as needed for migraine.  9 tablet  5  . traMADol (ULTRAM) 50 MG tablet Take 50 mg by mouth every 8 (eight) hours as needed for pain.       . traZODone (DESYREL) 50 MG tablet Take 1 tablet (50 mg total) by mouth at bedtime and may repeat dose one time if needed. For depression/sleep  30 tablet  0  . zonisamide (ZONEGRAN) 100 MG capsule Take 100 mg by mouth 2 (two) times daily.      Marland Kitchen HYDROcodone-acetaminophen (NORCO/VICODIN) 5-325 MG per tablet Take 1-2 tablets by mouth every 6 (six) hours as needed for pain.      Marland Kitchen HYDROcodone-acetaminophen (NORCO/VICODIN) 5-325 MG per tablet Take 1 tablet by mouth every 4 (four)  hours as needed for pain.  15 tablet  0  . lurasidone (LATUDA) 40 MG TABS tablet Take 40 mg by mouth 2 (two) times daily.      . metoCLOPramide (REGLAN) 10 MG tablet Take 1 tablet (10 mg total) by mouth every 6 (six) hours.  20 tablet  0   No facility-administered medications prior to visit.    PAST MEDICAL HISTORY: Past Medical History  Diagnosis Date  . Bipolar 1 disorder   . Migraine   . Mental disorder   . Anxiety   . Interstitial cystitis   . Galactorrhea   . Chronic back pain   . Seasonal allergies   .  Sinusitis   . Restless leg syndrome   . IBS (irritable bowel syndrome)   . Fibromyalgia     PAST SURGICAL HISTORY: Past Surgical History  Procedure Laterality Date  . Tubal ligation    . Bunionectomy    . Interstim implant placement    . Bowel resection  1998    prolapsed bowel  . Foot surgery      bunionectomy bil. pins put in and taken out  . Bladder surgery      distenstion numerous times    FAMILY HISTORY: Family History  Problem Relation Age of Onset  . Hypertension Mother   . Esophageal cancer Maternal Grandmother   . Hypertension Maternal Grandmother   . Heart disease Maternal Grandmother   . Stomach cancer Maternal Grandmother   . Diabetes Maternal Grandfather   . Stroke Maternal Grandfather   . Depression Maternal Grandfather   . Anxiety disorder Maternal Grandfather   . Hypertension Maternal Grandfather   . Heart disease Maternal Grandfather   . Heart disease Other   . Diabetes Father   . Heart disease Father   . Stroke Father     SOCIAL HISTORY: History   Social History  . Marital Status: Single    Spouse Name: N/A    Number of Children: 0  . Years of Education: 12   Occupational History  . disability    Social History Main Topics  . Smoking status: Former Games developer  . Smokeless tobacco: Never Used  . Alcohol Use: No     Comment: only  on holidays  . Drug Use: No  . Sexual Activity: Not Currently    Birth Control/ Protection: IUD     Comment: Mirena 10/29/2010   Other Topics Concern  . Not on file   Social History Narrative   Patient is single.   Patient lives alone   Patient is not working.   Patient has high school education.   Patient has no children.           PHYSICAL EXAM  Filed Vitals:   05/03/13 1522  BP: 108/83  Pulse: 94  Height: 5\' 3"  (1.6 m)  Weight: 139 lb (63.05 kg)   Body mass index is 24.63 kg/(m^2).  Generalized: Well developed, in no acute distress  Neurological examination   Mentation: Alert oriented  to time, place, history taking. Follows all commands speech and language fluent  Cranial nerve II-XII: Pupils were equal round reactive to light extraocular movements were full, visual field were full on confrontational test. Facial sensation and strength were normal. hearing was intact to finger rubbing bilaterally. Uvula tongue midline. head turning and shoulder shrug and were normal and symmetric.Tongue protrusion into cheek strength was normal. Motor: normal bulk and tone, full strength in the BUE, BLE, fine finger movements normal, no pronator  drift. No focal weakness Coordination: finger-nose-finger, heel-to-shin bilaterally, no dysmetria Reflexes: Brachioradialis 2/2, biceps 2/2, triceps 2/2, patellar 2/2, Achilles 2/2, plantar responses were flexor bilaterally. Gait and Station: Rising up from seated position without assistance, normal stance,  moderate stride, good arm swing, smooth turning, able to perform tiptoe, and heel walking without difficulty. Tandem gait is stable  DIAGNOSTIC DATA (LABS, IMAGING, TESTING) - I reviewed patient records, labs, notes, testing and imaging myself where available.  Lab Results  Component Value Date   WBC 6.8 11/06/2012   HGB 13.5 11/06/2012   HCT 39.6 11/06/2012   MCV 90.2 11/06/2012   PLT 232 11/06/2012      Component Value Date/Time   NA 137 11/06/2012 2024   K 3.7 11/06/2012 2024   CL 101 11/06/2012 2024   CO2 28 11/06/2012 2024   GLUCOSE 95 11/06/2012 2024   BUN 11 11/06/2012 2024   CREATININE 0.88 11/06/2012 2024   CALCIUM 9.5 11/06/2012 2024   PROT 6.9 11/06/2012 2024   ALBUMIN 4.0 11/06/2012 2024   AST 21 11/06/2012 2024   ALT 26 11/06/2012 2024   ALKPHOS 72 11/06/2012 2024   BILITOT 0.1* 11/06/2012 2024   GFRNONAA 85* 11/06/2012 2024   GFRAA >90 11/06/2012 2024    ASSESSMENT AND PLAN  32 y.o. year old female  has a past medical history of Bipolar 1 disorder; Migraine; Mental disorder; Anxiety;  Restless leg syndrome; here to followup for her  headaches.   Continue sumatriptan when necessary acute migraine Given information on migraine triggers Followup in 6 months  Nilda Riggs, [facility], [facility], APRN  [facility] [address], [address] [place], [place] [address] [phone]

## 2013-05-03 NOTE — Progress Notes (Signed)
I have read the note, and I agree with the clinical assessment and plan.  [name] KEITH   

## 2013-05-09 ENCOUNTER — Emergency Department (HOSPITAL_COMMUNITY)
Admission: EM | Admit: 2013-05-09 | Discharge: 2013-05-09 | Discharge status: Against Medical Advice | Payer: Medicare Other | Attending: Emergency Medicine | Admitting: Emergency Medicine

## 2013-05-09 ENCOUNTER — Encounter (HOSPITAL_COMMUNITY): Payer: Self-pay | Admitting: Emergency Medicine

## 2013-05-09 DIAGNOSIS — N3010 Interstitial cystitis (chronic) without hematuria: Secondary | ICD-10-CM | POA: Insufficient documentation

## 2013-05-09 DIAGNOSIS — R3989 Other symptoms and signs involving the genitourinary system: Secondary | ICD-10-CM | POA: Insufficient documentation

## 2013-05-09 HISTORY — DX: Interstitial cystitis (chronic) without hematuria: N30.10

## 2013-05-09 NOTE — ED Notes (Signed)
Pt and pts mother up to desk stating that she feels better and are leaving. Charge nurse notified.

## 2013-05-09 NOTE — ED Notes (Signed)
Pt states having an IC flare up, states has an appt w/ doctor tomorrow but couldn't take the bladder pain/burning.

## 2013-05-13 ENCOUNTER — Emergency Department (HOSPITAL_COMMUNITY)
Admission: EM | Admit: 2013-05-13 | Discharge: 2013-05-13 | Disposition: A | Discharge status: Home / Self Care | Payer: Medicare Other | Attending: Emergency Medicine | Admitting: Emergency Medicine

## 2013-05-13 ENCOUNTER — Encounter (HOSPITAL_COMMUNITY): Payer: Self-pay | Admitting: Emergency Medicine

## 2013-05-13 DIAGNOSIS — G43909 Migraine, unspecified, not intractable, without status migrainosus: Secondary | ICD-10-CM | POA: Insufficient documentation

## 2013-05-13 DIAGNOSIS — Z79899 Other long term (current) drug therapy: Secondary | ICD-10-CM | POA: Insufficient documentation

## 2013-05-13 DIAGNOSIS — F319 Bipolar disorder, unspecified: Secondary | ICD-10-CM | POA: Insufficient documentation

## 2013-05-13 DIAGNOSIS — F411 Generalized anxiety disorder: Secondary | ICD-10-CM | POA: Insufficient documentation

## 2013-05-13 DIAGNOSIS — R110 Nausea: Secondary | ICD-10-CM | POA: Insufficient documentation

## 2013-05-13 DIAGNOSIS — N3010 Interstitial cystitis (chronic) without hematuria: Secondary | ICD-10-CM | POA: Insufficient documentation

## 2013-05-13 DIAGNOSIS — N949 Unspecified condition associated with female genital organs and menstrual cycle: Secondary | ICD-10-CM | POA: Insufficient documentation

## 2013-05-13 DIAGNOSIS — R102 Pelvic and perineal pain: Secondary | ICD-10-CM

## 2013-05-13 DIAGNOSIS — Z8709 Personal history of other diseases of the respiratory system: Secondary | ICD-10-CM | POA: Insufficient documentation

## 2013-05-13 DIAGNOSIS — Z760 Encounter for issue of repeat prescription: Secondary | ICD-10-CM

## 2013-05-13 DIAGNOSIS — Z8719 Personal history of other diseases of the digestive system: Secondary | ICD-10-CM | POA: Insufficient documentation

## 2013-05-13 DIAGNOSIS — IMO0001 Reserved for inherently not codable concepts without codable children: Secondary | ICD-10-CM | POA: Insufficient documentation

## 2013-05-13 DIAGNOSIS — G8929 Other chronic pain: Secondary | ICD-10-CM | POA: Insufficient documentation

## 2013-05-13 DIAGNOSIS — Z87891 Personal history of nicotine dependence: Secondary | ICD-10-CM | POA: Insufficient documentation

## 2013-05-13 MED ORDER — HYDROCODONE-ACETAMINOPHEN 5-325 MG PO TABS: ORAL_TABLET | ORAL | Status: DC

## 2013-05-13 MED ORDER — ONDANSETRON HCL 4 MG PO TABS
4.0000 mg | ORAL_TABLET | Freq: Once | ORAL | Status: AC
  Administered 2013-05-13: 4 mg via ORAL
  Filled 2013-05-13: qty 1

## 2013-05-13 MED ORDER — MORPHINE SULFATE 4 MG/ML IJ SOLN
4.0000 mg | Freq: Once | INTRAMUSCULAR | Status: AC
  Administered 2013-05-13: 4 mg via INTRAMUSCULAR
  Filled 2013-05-13: qty 1

## 2013-05-13 NOTE — ED Notes (Signed)
Pt c/o pain due to her chronic interstitial cystitis.  States that she hurts everywhere (worse x 3-4 days).

## 2013-05-13 NOTE — ED Provider Notes (Signed)
CSN: [id number]     Arrival date & time 05/13/13  0946 History   First MD Initiated Contact with Patient 05/13/13 1145     Chief Complaint  Patient presents with  . Pelvic Pain   (Consider location/radiation/quality/duration/timing/severity/associated sxs/prior Treatment) HPI Pt is a 32yo female with hx of IBS, fibromyalgia, and interstitial cystitis c/o worsening lower abdominal and pelvic pain for the last 3-4 days. States she is about to start her menstrual cycle which always makes her IC pain worse.  Pain is constant, waxes and wanes, cramping and aching, 10/10.  Has tried tramadol and zofran at home without relief.  Pt states she was recently seen by GYN who performed pelvic exam with STD testing, and plans to have pelvic ultrasound November 13th.  Pt does have Mirana in place but has noticed increased cramping.  Also reports having f/u with her urologist, Dr. Logan Bores, but the earliest appointment she can get is December 31st.  Pt is on daily macrobid.  Reports "normal" urinary symptoms of her IC. Denies fever, vomiting or diarrhea. Denies vaginal discharge or pain.  Past Medical History  Diagnosis Date  . Bipolar 1 disorder   . Migraine   . Mental disorder   . Anxiety   . Interstitial cystitis   . Galactorrhea   . Chronic back pain   . Seasonal allergies   . Sinusitis   . Restless leg syndrome   . IBS (irritable bowel syndrome)   . Fibromyalgia   . IC (interstitial cystitis)    Past Surgical History  Procedure Laterality Date  . Tubal ligation    . Bunionectomy    . Interstim implant placement    . Bowel resection  1998    prolapsed bowel  . Foot surgery      bunionectomy bil. pins put in and taken out  . Bladder surgery      distenstion numerous times   Family History  Problem Relation Age of Onset  . Hypertension Mother   . Esophageal cancer Maternal Grandmother   . Hypertension Maternal Grandmother   . Heart disease Maternal Grandmother   . Stomach cancer Maternal  Grandmother   . Diabetes Maternal Grandfather   . Stroke Maternal Grandfather   . Depression Maternal Grandfather   . Anxiety disorder Maternal Grandfather   . Hypertension Maternal Grandfather   . Heart disease Maternal Grandfather   . Heart disease Other   . Diabetes Father   . Heart disease Father   . Stroke Father    History  Substance Use Topics  . Smoking status: Former Games developer  . Smokeless tobacco: Never Used  . Alcohol Use: No     Comment: only  on holidays   OB History   Grav Para Term Preterm Abortions TAB SAB Ect Mult Living   0              Review of Systems  Constitutional: Negative for fever and chills.  Gastrointestinal: Positive for nausea and abdominal pain. Negative for vomiting.  Genitourinary: Positive for dysuria, frequency and pelvic pain. Negative for hematuria, vaginal bleeding, vaginal discharge and vaginal pain.  All other systems reviewed and are negative.    Allergies  Augmentin; Ciprofloxacin; Erythromycin; Gabapentin; Other; Prozac; and Pyridium  Home Medications   Current Outpatient Rx  Name  Route  Sig  Dispense  Refill  . buPROPion (WELLBUTRIN) 75 MG tablet      75 mg every morning.         Marland Kitchen doxazosin (  CARDURA) 4 MG tablet   Oral   Take by mouth at bedtime.         Marland Kitchen ibuprofen (ADVIL,MOTRIN) 200 MG tablet   Oral   Take 600 mg by mouth every 6 (six) hours as needed for pain (pain).         Marland Kitchen levocetirizine (XYZAL) 5 MG tablet   Oral   Take 5 mg by mouth every evening.         Marland Kitchen levonorgestrel (MIRENA) 20 MCG/24HR IUD   Intrauterine   1 each by Intrauterine route once.         . nitrofurantoin (MACRODANTIN) 50 MG capsule   Oral   Take 50 mg by mouth at bedtime. Started 4/26         . ondansetron (ZOFRAN) 8 MG tablet   Oral   Take 0.5 tablets (4 mg total) by mouth every 8 (eight) hours as needed for nausea. For nausea   20 tablet      . SUMAtriptan (IMITREX) 100 MG tablet   Oral   Take 1 tablet (100 mg  total) by mouth 2 (two) times daily as needed for migraine.   9 tablet   5   . traMADol (ULTRAM) 50 MG tablet   Oral   Take 50 mg by mouth every 8 (eight) hours as needed for pain.          . traZODone (DESYREL) 50 MG tablet   Oral   Take 1 tablet (50 mg total) by mouth at bedtime and may repeat dose one time if needed. For depression/sleep   30 tablet   0   . zonisamide (ZONEGRAN) 100 MG capsule   Oral   Take 100 mg by mouth 2 (two) times daily.         Marland Kitchen HYDROcodone-acetaminophen (NORCO/VICODIN) 5-325 MG per tablet      Take 1-2 pills every 4-6 hours as needed for pain.   15 tablet   0    BP 135/77  Pulse 67  Temp(Src) 98.3 F (36.8 C) (Oral)  Resp 15  SpO2 99%  LMP 04/20/2013 Physical Exam  Nursing note and vitals reviewed. Constitutional: She appears well-developed and well-nourished.  Pt lying on right side in fetal position, appears mildly uncomfortable.  HENT:  Head: Normocephalic and atraumatic.  Eyes: Conjunctivae are normal. No scleral icterus.  Neck: Normal range of motion.  Cardiovascular: Normal rate, regular rhythm and normal heart sounds.   Pulmonary/Chest: Effort normal and breath sounds normal. No respiratory distress. She has no wheezes. She has no rales. She exhibits no tenderness.  Abdominal: Soft. Bowel sounds are normal. She exhibits no distension and no mass. There is tenderness ( lower abdomen, pelvis). There is no rebound and no guarding.  Genitourinary: Vagina normal and uterus normal. No labial fusion. There is no rash, tenderness, lesion or injury on the right labia. There is no rash, tenderness, lesion or injury on the left labia. Cervix exhibits no motion tenderness and no friability. Right adnexum displays no mass, no tenderness and no fullness. Left adnexum displays no mass, no tenderness and no fullness. No erythema, tenderness or bleeding around the vagina. No foreign body around the vagina. No signs of injury around the vagina. No  vaginal discharge found.  External: no erythema, lesions or tenderness. Speculum: normal vaginal wall, no discharge. Cervix: IUD in place. No discharge. No CMT, adnexal tenderness or masses.  Musculoskeletal: Normal range of motion.  Neurological: She is alert.  Skin: Skin is  warm and dry.    ED Course  Procedures (including critical care time) Labs Review Labs Reviewed - No data to display Imaging Review No results found.  EKG Interpretation   None       MDM   1. Pelvic pain   2. IC (interstitial cystitis)   3. Medication refill    Pt with hx of IC c/o pelvic pain, consistent with previous IC flares just prior to starting her menstrual cycle.  Reports GYN just performed pelvic exam with swabs last week and she is scheduled for Pelvic U/S Nov. 13th but pain has become too intense to wait until then.  Pt sees Dr. Logan Bores for her IC but cannot f/u with him until Dec. 31st.  Denies fever or vaginal discharge. Tx in ED: morphine and zofran.  This did alleviate pt's pain.  Labs not repeated today as pt reports normal UA and negative pregnancy test just last week with GYN and swabs from pelvic exam pending as they were just sent off Thurs or Friday of last week.  Normal pelvic exam.  Rx: norco.  F/u as scheduled with urology and GYN.  Return precautions provided. Pt verbalized understanding and agreement with tx plan.    Junius Finner, PA-C 05/14/13 [id number]

## 2013-05-13 NOTE — ED Notes (Signed)
She c/o pelvic pain.  She states she recognizes it as her chronic I.C. Pain, for which she sees Dr. Logan Bores in [place], [place].  She states this pain became worse when she began her menses yesterday.  She is in no distress.  She denies any fever.

## 2013-05-14 NOTE — ED Provider Notes (Signed)
Medical screening examination/treatment/procedure(s) were performed by non-physician practitioner and as supervising physician I was immediately available for consultation/collaboration.  EKG Interpretation   None         Dagmar Hait, MD 05/14/13 [id number]

## 2013-06-06 ENCOUNTER — Encounter (HOSPITAL_COMMUNITY): Payer: Self-pay

## 2013-06-11 ENCOUNTER — Other Ambulatory Visit: Payer: Self-pay | Admitting: Obstetrics and Gynecology

## 2013-06-12 ENCOUNTER — Other Ambulatory Visit: Payer: Self-pay | Admitting: Obstetrics and Gynecology

## 2013-06-12 ENCOUNTER — Other Ambulatory Visit (HOSPITAL_COMMUNITY): Payer: Self-pay | Admitting: Obstetrics and Gynecology

## 2013-06-12 NOTE — H&P (Signed)
[name] is a 32 y.o. female G0P0 who presents for laparoscopic ovarian cystectomy because of pelvic pain and large ovarian cyst. Patient has had a Mirena IUD for over two years and reports recent irregular bleeding (3 times a month). Though the bleeding has not been heavy, it has lasted as long as 7 days and is accompanied by severe cramping.  She rates the cramping as a 10/10 on a 10 point pain scale with some relief when she takes Vicodin.  During her evaluation for irregular bleeding and worsening pelvic pain, she was found to have a uterus: 4.9 x 2.8 x 4.4 cm with IUD in proper orientation per 3D imaging with normal appearing ovaries except that the right ovary contained a 6.8 x 4.7 x 6.3 cm simple cyst with normal color Doppler flow.  The patient admits to urinary frequency but denies dysuria, incontinence, constipation, diarrhea or vaginitis symptoms.  Since she has interstitial cystits, she's not certain if her pelvic pain, at times is because of the cyst or her bladder but she does have increased discomfort with any "jarring"  movements.  Given her  increased discomfort, along with the findings on ultrasound,  the patient wants to proceed with removal of the ovarian cyst.  Past Medical History  OB History: G0P0   GYN History: menarche: 32 YO    LMP: 05/28/2013    Contracepton: Tubal Sterilization; Denies history of abnormal PAP smear or STD;  Last PAP smear: 05/2013  Medical History:  Bipolar disorder, migraine, interstitial cystitis, PTSD, anxiety, chronic back pain, IBS,  Surgical History: Bunionectomy, Interstim Placement, Colon Resection and Tubal Sterilization Denies problems with anesthesia or  history of blood transfusions  Family History: Stroke, diabetes mellitus, esophageal cancer, depression, anxiety and hypertension  Social History:  Single and is on disability;  Denies tobacco but admits to occasional alcohol intake Medications:  Buproprion 75 mg Doxazosin 4 mg Epi  Pen Vicodin 3/325 mg Zofran 8 mg Sumatriptan 100 mg Tramadol 50 mg Zonisamide 100 mg   Allergies  Allergen Reactions  . Augmentin [Amoxicillin-Pot Clavulanate] Nausea And Vomiting  . Ciprofloxacin Nausea Only  . Erythromycin Nausea And Vomiting    severe  . Gabapentin Other (See Comments)    SI INTENTIONS  . Morphine And Related Itching  . Other Other (See Comments)    Medication:Urised Reaction:"hurts bladder"  . Prozac [Fluoxetine Hcl]     SI  . Pyridium [Phenazopyridine Hcl]     Increased bladder pain   Denies sensitivity to peanuts, shellfish, soy, latex or adhesives.  ROS: Admits to glasses, tension headaches (in addition to her migraines), occasional nausea and vomiting in relation to stress, urinary frequency but   denies  vision changes, nasal congestion, dysphagia, tinnitus, dizziness, hoarseness, cough,  chest pain, shortness of breath,  diarrhea,constipation,  urinary urgency  dysuria, hematuria, vaginitis symptoms, swelling of joints,easy bruising,  myalgias, arthralgias, skin rashes, unexplained weight loss and except as is mentioned in the history of present illness, patient's review of systems is otherwise negative.  Physical Exam   Bp: 92/50    P: 64     R: 12     Temperature: 98.8 degrees F orally    Weight:  138 lbs.  Height: 5\' 2"     BMI: 25.2  Neck: supple without masses or thyromegaly Lungs: clear to auscultation Heart: regular rate and rhythm Abdomen: soft, non-tender and no organomegaly Pelvic:EGBUS- wnl; vagina-normal rugae; uterus-normal size, cervix without lesions or motion tenderness; adnexae- tender with no detectable  masses though exam was limited by patient discomfort. Extremities:  no clubbing, cyanosis or edema   Assesment:  Large Ovarian Cyst             Pelvic Pain   Disposition:  A discussion was held with patient regarding the indication for her procedure(s) along with the risks, which include but are not limited to: reaction to  anesthesia, damage to adjacent organs, infection, removal of ovary and excessive bleeding. The patient verbalized understanding of these risks and has consented to proceed with Laparoscopic Ovarian Cystectomy at [facility] [place] on June 13, 2013 at noon.   CSN# [id number]   Tildon Silveria J. Lowell Guitar, PA-C  for Dr. Woodroe Mode. Su Hilt

## 2013-06-13 ENCOUNTER — Encounter (HOSPITAL_COMMUNITY): Payer: Self-pay

## 2013-06-13 ENCOUNTER — Ambulatory Visit (HOSPITAL_COMMUNITY): Payer: Medicare Other | Admitting: Anesthesiology

## 2013-06-13 ENCOUNTER — Inpatient Hospital Stay (HOSPITAL_COMMUNITY)
Admission: RE | Admit: 2013-06-13 | Discharge: 2013-06-15 | DRG: 743 | Disposition: A | Discharge status: Home / Self Care | Payer: Medicare Other | Source: Ambulatory Visit | Attending: Obstetrics and Gynecology | Admitting: Obstetrics and Gynecology

## 2013-06-13 ENCOUNTER — Encounter (HOSPITAL_COMMUNITY)
Admission: RE | Disposition: A | Discharge status: Home / Self Care | Payer: Self-pay | Source: Ambulatory Visit | Attending: Obstetrics and Gynecology

## 2013-06-13 ENCOUNTER — Encounter (HOSPITAL_COMMUNITY): Payer: Medicare Other | Admitting: Anesthesiology

## 2013-06-13 DIAGNOSIS — N8000 Endometriosis of the uterus, unspecified: Secondary | ICD-10-CM | POA: Diagnosis present

## 2013-06-13 DIAGNOSIS — N8300 Follicular cyst of ovary, unspecified side: Secondary | ICD-10-CM | POA: Diagnosis present

## 2013-06-13 DIAGNOSIS — N949 Unspecified condition associated with female genital organs and menstrual cycle: Secondary | ICD-10-CM | POA: Diagnosis present

## 2013-06-13 DIAGNOSIS — N83209 Unspecified ovarian cyst, unspecified side: Principal | ICD-10-CM | POA: Diagnosis present

## 2013-06-13 DIAGNOSIS — Z5331 Laparoscopic surgical procedure converted to open procedure: Secondary | ICD-10-CM

## 2013-06-13 HISTORY — DX: Insomnia, unspecified: G47.00

## 2013-06-13 HISTORY — PX: LAPAROTOMY: SHX154

## 2013-06-13 HISTORY — DX: Gastro-esophageal reflux disease without esophagitis: K21.9

## 2013-06-13 HISTORY — PX: LAPAROSCOPY: SHX197

## 2013-06-13 LAB — CBC
HCT: 38 % (ref 36.0–46.0)
HCT: 33.3 % — ABNORMAL LOW (ref 36.0–46.0)
Hemoglobin: 12.8 g/dL (ref 12.0–15.0)
Hemoglobin: 11.3 g/dL — ABNORMAL LOW (ref 12.0–15.0)
MCH: 30 pg (ref 26.0–34.0)
MCH: 30.2 pg (ref 26.0–34.0)
MCHC: 33.7 g/dL (ref 30.0–36.0)
MCHC: 33.9 g/dL (ref 30.0–36.0)
MCV: 89 fL (ref 78.0–100.0)
MCV: 89 fL (ref 78.0–100.0)
Platelets: 248 10*3/uL (ref 150–400)
Platelets: 222 10*3/uL (ref 150–400)
RBC: 4.27 MIL/uL (ref 3.87–5.11)
RBC: 3.74 MIL/uL — ABNORMAL LOW (ref 3.87–5.11)
RDW: 13.2 % (ref 11.5–15.5)
RDW: 13.1 % (ref 11.5–15.5)
WBC: 5.2 10*3/uL (ref 4.0–10.5)
WBC: 11.5 10*3/uL — ABNORMAL HIGH (ref 4.0–10.5)

## 2013-06-13 LAB — HCG, SERUM, QUALITATIVE: Preg, Serum: NEGATIVE

## 2013-06-13 SURGERY — LAPAROSCOPY OPERATIVE
Anesthesia: General | Site: Abdomen | Laterality: Right

## 2013-06-13 MED ORDER — METOCLOPRAMIDE HCL 5 MG/ML IJ SOLN
INTRAMUSCULAR | Status: DC | PRN
  Administered 2013-06-13: 10 mg via INTRAVENOUS

## 2013-06-13 MED ORDER — HYDROCODONE-ACETAMINOPHEN 5-325 MG PO TABS
1.0000 | ORAL_TABLET | ORAL | Status: DC | PRN
  Administered 2013-06-14: 1 via ORAL
  Filled 2013-06-13: qty 2
  Filled 2013-06-13: qty 1

## 2013-06-13 MED ORDER — GLYCOPYRROLATE 0.2 MG/ML IJ SOLN
INTRAMUSCULAR | Status: DC | PRN
  Administered 2013-06-13: 0.2 mg via INTRAVENOUS
  Administered 2013-06-13: 0.6 mg via INTRAVENOUS

## 2013-06-13 MED ORDER — LACTATED RINGERS IV SOLN
INTRAVENOUS | Status: DC
  Administered 2013-06-13: 20:00:00 via INTRAVENOUS

## 2013-06-13 MED ORDER — LACTATED RINGERS IR SOLN
Status: DC | PRN
  Administered 2013-06-13: 3000 mL

## 2013-06-13 MED ORDER — HEPARIN SODIUM (PORCINE) 5000 UNIT/ML IJ SOLN
INTRAMUSCULAR | Status: DC | PRN
  Administered 2013-06-13: 5000 [IU] via SUBCUTANEOUS

## 2013-06-13 MED ORDER — GLYCOPYRROLATE 0.2 MG/ML IJ SOLN
INTRAMUSCULAR | Status: AC
  Filled 2013-06-13: qty 3

## 2013-06-13 MED ORDER — KETOROLAC TROMETHAMINE 30 MG/ML IJ SOLN
30.0000 mg | Freq: Four times a day (QID) | INTRAMUSCULAR | Status: DC
  Administered 2013-06-13: 30 mg via INTRAVENOUS
  Filled 2013-06-13 (×2): qty 1

## 2013-06-13 MED ORDER — ONDANSETRON HCL 4 MG/2ML IJ SOLN: 4.0000 mg | Freq: Four times a day (QID) | INTRAMUSCULAR | Status: DC | PRN

## 2013-06-13 MED ORDER — BUPIVACAINE HCL (PF) 0.25 % IJ SOLN
INTRAMUSCULAR | Status: DC | PRN
  Administered 2013-06-13: 28 mL

## 2013-06-13 MED ORDER — HYDROMORPHONE 0.3 MG/ML IV SOLN
INTRAVENOUS | Status: DC
  Administered 2013-06-13: 0.8 mg via INTRAVENOUS
  Administered 2013-06-13: 17:00:00 via INTRAVENOUS
  Administered 2013-06-13: 0.999 mg via INTRAVENOUS
  Administered 2013-06-14: 1.2 mg via INTRAVENOUS
  Filled 2013-06-13: qty 25

## 2013-06-13 MED ORDER — ZONISAMIDE 100 MG PO CAPS
100.0000 mg | ORAL_CAPSULE | Freq: Two times a day (BID) | ORAL | Status: DC
  Administered 2013-06-13 – 2013-06-15 (×4): 100 mg via ORAL
  Filled 2013-06-13 (×4): qty 1

## 2013-06-13 MED ORDER — GLYCOPYRROLATE 0.2 MG/ML IJ SOLN
INTRAMUSCULAR | Status: AC
  Filled 2013-06-13: qty 1

## 2013-06-13 MED ORDER — PHENYLEPHRINE 40 MCG/ML (10ML) SYRINGE FOR IV PUSH (FOR BLOOD PRESSURE SUPPORT)
PREFILLED_SYRINGE | INTRAVENOUS | Status: AC
  Filled 2013-06-13: qty 5

## 2013-06-13 MED ORDER — NALOXONE HCL 0.4 MG/ML IJ SOLN: 0.4000 mg | INTRAMUSCULAR | Status: DC | PRN

## 2013-06-13 MED ORDER — LIDOCAINE HCL (CARDIAC) 20 MG/ML IV SOLN
INTRAVENOUS | Status: AC
  Filled 2013-06-13: qty 5

## 2013-06-13 MED ORDER — IBUPROFEN 600 MG PO TABS
600.0000 mg | ORAL_TABLET | Freq: Four times a day (QID) | ORAL | Status: DC | PRN
  Administered 2013-06-14 – 2013-06-15 (×4): 600 mg via ORAL
  Filled 2013-06-13 (×4): qty 1

## 2013-06-13 MED ORDER — KETOROLAC TROMETHAMINE 30 MG/ML IJ SOLN: 15.0000 mg | Freq: Once | INTRAMUSCULAR | Status: DC | PRN

## 2013-06-13 MED ORDER — FENTANYL CITRATE 0.05 MG/ML IJ SOLN
INTRAMUSCULAR | Status: AC
  Filled 2013-06-13: qty 5

## 2013-06-13 MED ORDER — METOCLOPRAMIDE HCL 5 MG/ML IJ SOLN
INTRAMUSCULAR | Status: AC
  Filled 2013-06-13: qty 2

## 2013-06-13 MED ORDER — ONDANSETRON HCL 4 MG/2ML IJ SOLN
INTRAMUSCULAR | Status: DC | PRN
  Administered 2013-06-13: 4 mg via INTRAVENOUS

## 2013-06-13 MED ORDER — MIDAZOLAM HCL 2 MG/2ML IJ SOLN
INTRAMUSCULAR | Status: DC | PRN
  Administered 2013-06-13: 2 mg via INTRAVENOUS

## 2013-06-13 MED ORDER — PROPOFOL 10 MG/ML IV BOLUS
INTRAVENOUS | Status: DC | PRN
  Administered 2013-06-13: 180 mg via INTRAVENOUS
  Administered 2013-06-13: 20 mg via INTRAVENOUS

## 2013-06-13 MED ORDER — HYDROMORPHONE HCL PF 1 MG/ML IJ SOLN
INTRAMUSCULAR | Status: AC
  Filled 2013-06-13: qty 1

## 2013-06-13 MED ORDER — KETOROLAC TROMETHAMINE 30 MG/ML IJ SOLN
INTRAMUSCULAR | Status: AC
  Filled 2013-06-13: qty 1

## 2013-06-13 MED ORDER — ROCURONIUM BROMIDE 100 MG/10ML IV SOLN
INTRAVENOUS | Status: DC | PRN
  Administered 2013-06-13 (×2): 10 mg via INTRAVENOUS
  Administered 2013-06-13: 40 mg via INTRAVENOUS

## 2013-06-13 MED ORDER — PHENYLEPHRINE 40 MCG/ML (10ML) SYRINGE FOR IV PUSH (FOR BLOOD PRESSURE SUPPORT)
PREFILLED_SYRINGE | INTRAVENOUS | Status: AC
Start: 2013-06-13 — End: 2013-06-13
  Filled 2013-06-13: qty 10

## 2013-06-13 MED ORDER — MEPERIDINE HCL 25 MG/ML IJ SOLN: 6.2500 mg | INTRAMUSCULAR | Status: DC | PRN

## 2013-06-13 MED ORDER — FENTANYL CITRATE 0.05 MG/ML IJ SOLN
INTRAMUSCULAR | Status: DC | PRN
  Administered 2013-06-13: 100 ug via INTRAVENOUS

## 2013-06-13 MED ORDER — MIDAZOLAM HCL 2 MG/2ML IJ SOLN
INTRAMUSCULAR | Status: AC
  Filled 2013-06-13: qty 2

## 2013-06-13 MED ORDER — HYDROMORPHONE HCL PF 1 MG/ML IJ SOLN
0.2500 mg | INTRAMUSCULAR | Status: DC | PRN
  Administered 2013-06-13 (×2): 0.5 mg via INTRAVENOUS

## 2013-06-13 MED ORDER — ONDANSETRON 8 MG/NS 50 ML IVPB
8.0000 mg | Freq: Three times a day (TID) | INTRAVENOUS | Status: DC | PRN
  Filled 2013-06-13: qty 8

## 2013-06-13 MED ORDER — ACETAMINOPHEN 10 MG/ML IV SOLN
1000.0000 mg | Freq: Once | INTRAVENOUS | Status: AC
  Administered 2013-06-13: 1000 mg via INTRAVENOUS
  Filled 2013-06-13: qty 100

## 2013-06-13 MED ORDER — TRAZODONE HCL 150 MG PO TABS
150.0000 mg | ORAL_TABLET | Freq: Every evening | ORAL | Status: DC | PRN
  Administered 2013-06-13 – 2013-06-14 (×3): 150 mg via ORAL
  Filled 2013-06-13 (×4): qty 1

## 2013-06-13 MED ORDER — ONDANSETRON HCL 4 MG/2ML IJ SOLN
INTRAMUSCULAR | Status: AC
  Filled 2013-06-13: qty 2

## 2013-06-13 MED ORDER — ROCURONIUM BROMIDE 100 MG/10ML IV SOLN
INTRAVENOUS | Status: AC
  Filled 2013-06-13: qty 1

## 2013-06-13 MED ORDER — PROPOFOL 10 MG/ML IV EMUL
INTRAVENOUS | Status: AC
  Filled 2013-06-13: qty 20

## 2013-06-13 MED ORDER — DEXTROSE 5 % IV SOLN
Freq: Once | INTRAVENOUS | Status: AC
  Administered 2013-06-13: 100 mL via INTRAVENOUS
  Filled 2013-06-13: qty 7.5

## 2013-06-13 MED ORDER — SODIUM CHLORIDE 0.9 % IJ SOLN: 9.0000 mL | INTRAMUSCULAR | Status: DC | PRN

## 2013-06-13 MED ORDER — MIDAZOLAM HCL 2 MG/2ML IJ SOLN: 0.5000 mg | Freq: Once | INTRAMUSCULAR | Status: DC | PRN

## 2013-06-13 MED ORDER — HYDROMORPHONE HCL PF 1 MG/ML IJ SOLN
INTRAMUSCULAR | Status: DC | PRN
  Administered 2013-06-13 (×3): 0.5 mg via INTRAVENOUS
  Administered 2013-06-13: 1 mg via INTRAVENOUS
  Administered 2013-06-13: 0.5 mg via INTRAVENOUS
  Administered 2013-06-13: 1 mg via INTRAVENOUS

## 2013-06-13 MED ORDER — ONDANSETRON HCL 4 MG PO TABS
4.0000 mg | ORAL_TABLET | Freq: Three times a day (TID) | ORAL | Status: DC | PRN
  Administered 2013-06-14: 4 mg via ORAL
  Filled 2013-06-13: qty 1

## 2013-06-13 MED ORDER — PROMETHAZINE HCL 25 MG/ML IJ SOLN: 6.2500 mg | INTRAMUSCULAR | Status: DC | PRN

## 2013-06-13 MED ORDER — BUPROPION HCL 75 MG PO TABS
75.0000 mg | ORAL_TABLET | Freq: Every morning | ORAL | Status: DC
  Administered 2013-06-14 – 2013-06-15 (×2): 75 mg via ORAL
  Filled 2013-06-13 (×2): qty 1

## 2013-06-13 MED ORDER — HEPARIN SODIUM (PORCINE) 5000 UNIT/ML IJ SOLN
INTRAMUSCULAR | Status: AC
  Filled 2013-06-13: qty 1

## 2013-06-13 MED ORDER — DIPHENHYDRAMINE HCL 12.5 MG/5ML PO ELIX: 12.5000 mg | ORAL_SOLUTION | Freq: Four times a day (QID) | ORAL | Status: DC | PRN

## 2013-06-13 MED ORDER — LIDOCAINE HCL (CARDIAC) 20 MG/ML IV SOLN
INTRAVENOUS | Status: DC | PRN
  Administered 2013-06-13: 50 mg via INTRAVENOUS

## 2013-06-13 MED ORDER — DOXAZOSIN MESYLATE 4 MG PO TABS
4.0000 mg | ORAL_TABLET | Freq: Every day | ORAL | Status: DC
  Administered 2013-06-13 – 2013-06-14 (×2): 4 mg via ORAL
  Filled 2013-06-13: qty 1

## 2013-06-13 MED ORDER — HYDROMORPHONE HCL PF 1 MG/ML IJ SOLN
INTRAMUSCULAR | Status: AC
  Administered 2013-06-13: 0.5 mg via INTRAVENOUS
  Filled 2013-06-13: qty 1

## 2013-06-13 MED ORDER — MENTHOL 3 MG MT LOZG: 1.0000 | LOZENGE | OROMUCOSAL | Status: DC | PRN

## 2013-06-13 MED ORDER — PHENYLEPHRINE HCL 10 MG/ML IJ SOLN
INTRAMUSCULAR | Status: DC | PRN
  Administered 2013-06-13 (×4): 80 ug via INTRAVENOUS
  Administered 2013-06-13: 40 ug via INTRAVENOUS
  Administered 2013-06-13 (×2): 80 ug via INTRAVENOUS
  Administered 2013-06-13: 40 ug via INTRAVENOUS

## 2013-06-13 MED ORDER — NEOSTIGMINE METHYLSULFATE 1 MG/ML IJ SOLN
INTRAMUSCULAR | Status: AC
  Filled 2013-06-13: qty 1

## 2013-06-13 MED ORDER — LACTATED RINGERS IV SOLN
INTRAVENOUS | Status: DC
  Administered 2013-06-13 (×4): via INTRAVENOUS

## 2013-06-13 MED ORDER — BUPIVACAINE HCL (PF) 0.25 % IJ SOLN
INTRAMUSCULAR | Status: AC
  Filled 2013-06-13: qty 30

## 2013-06-13 MED ORDER — KETOROLAC TROMETHAMINE 30 MG/ML IJ SOLN
INTRAMUSCULAR | Status: DC | PRN
  Administered 2013-06-13: 30 mg via INTRAVENOUS

## 2013-06-13 MED ORDER — NEOSTIGMINE METHYLSULFATE 1 MG/ML IJ SOLN
INTRAMUSCULAR | Status: DC | PRN
  Administered 2013-06-13: 3 mg via INTRAVENOUS

## 2013-06-13 MED ORDER — DIPHENHYDRAMINE HCL 50 MG/ML IJ SOLN: 12.5000 mg | Freq: Four times a day (QID) | INTRAMUSCULAR | Status: DC | PRN

## 2013-06-13 SURGICAL SUPPLY — 50 items
ADH SKN CLS APL DERMABOND .7 (GAUZE/BANDAGES/DRESSINGS) ×2
BAG SPEC RTRVL LRG 6X4 10 (ENDOMECHANICALS)
BLADE 10 SAFETY STRL DISP (BLADE) ×1 IMPLANT
CABLE HIGH FREQUENCY MONO STRZ (ELECTRODE) IMPLANT
CHLORAPREP W/TINT 26ML (MISCELLANEOUS) ×3 IMPLANT
CLEANER TIP ELECTROSURG 2X2 (MISCELLANEOUS) ×1 IMPLANT
CLOTH BEACON ORANGE TIMEOUT ST (SAFETY) ×3 IMPLANT
DERMABOND ADVANCED (GAUZE/BANDAGES/DRESSINGS) ×1
DERMABOND ADVANCED .7 DNX12 (GAUZE/BANDAGES/DRESSINGS) ×2 IMPLANT
DISSECTOR BLUNT CHERRY 10MM (MISCELLANEOUS) ×1 IMPLANT
DRSG OPSITE POSTOP 4X10 (GAUZE/BANDAGES/DRESSINGS) ×1 IMPLANT
FORCEPS CUTTING 33CM 5MM (CUTTING FORCEPS) IMPLANT
FORCEPS CUTTING 45CM 5MM (CUTTING FORCEPS) IMPLANT
GLOVE BIO SURGEON STRL SZ7.5 (GLOVE) ×3 IMPLANT
GLOVE BIOGEL PI IND STRL 7.5 (GLOVE) ×2 IMPLANT
GLOVE BIOGEL PI INDICATOR 7.5 (GLOVE) ×1
GOWN STRL REIN XL XLG (GOWN DISPOSABLE) ×3 IMPLANT
HEMOSTAT SURGICEL 2X14 (HEMOSTASIS) ×1 IMPLANT
HEMOSTAT SURGICEL 4X8 (HEMOSTASIS) ×1 IMPLANT
NEEDLE INSUFFLATION 120MM (ENDOMECHANICALS) IMPLANT
NS IRRIG 1000ML POUR BTL (IV SOLUTION) ×3 IMPLANT
PACK LAPAROSCOPY BASIN (CUSTOM PROCEDURE TRAY) ×3 IMPLANT
PENCIL BUTTON HOLSTER BLD 10FT (ELECTRODE) ×1 IMPLANT
POUCH SPECIMEN RETRIEVAL 10MM (ENDOMECHANICALS) IMPLANT
PROTECTOR NERVE ULNAR (MISCELLANEOUS) ×3 IMPLANT
SET IRRIG TUBING LAPAROSCOPIC (IRRIGATION / IRRIGATOR) IMPLANT
SOLUTION ELECTROLUBE (MISCELLANEOUS) IMPLANT
SPONGE LAP 18X18 X RAY DECT (DISPOSABLE) ×2 IMPLANT
SUT CHROMIC 2 0 CT 1 (SUTURE) ×1 IMPLANT
SUT MNCRL AB 3-0 PS2 27 (SUTURE) ×1 IMPLANT
SUT MON AB 4-0 PS1 27 (SUTURE) ×3 IMPLANT
SUT PLAIN 2 0 XLH (SUTURE) ×1 IMPLANT
SUT VIC AB 0 CT1 27 (SUTURE) ×12
SUT VIC AB 0 CT1 27XBRD ANBCTR (SUTURE) IMPLANT
SUT VIC AB 3-0 SH 27 (SUTURE) ×3
SUT VIC AB 3-0 SH 27X BRD (SUTURE) IMPLANT
SUT VICRYL 0 ENDOLOOP (SUTURE) IMPLANT
SUT VICRYL 0 TIES 12 18 (SUTURE) ×1 IMPLANT
SUT VICRYL 0 UR6 27IN ABS (SUTURE) ×4 IMPLANT
SYR 50ML LL SCALE MARK (SYRINGE) IMPLANT
TOWEL OR 17X24 6PK STRL BLUE (TOWEL DISPOSABLE) ×6 IMPLANT
TRAY FOLEY CATH 14FR (SET/KITS/TRAYS/PACK) ×3 IMPLANT
TROCAR BALLN 12MMX100 BLUNT (TROCAR) ×1 IMPLANT
TROCAR XCEL NON-BLD 11X100MML (ENDOMECHANICALS) ×3 IMPLANT
TROCAR XCEL NON-BLD 5MMX100MML (ENDOMECHANICALS) ×4 IMPLANT
TROCAR XCEL OPT SLVE 5M 100M (ENDOMECHANICALS) IMPLANT
TUBING NON-CON 1/4 X 20 CONN (TUBING) ×1 IMPLANT
WARMER LAPAROSCOPE (MISCELLANEOUS) ×3 IMPLANT
WATER STERILE IRR 1000ML POUR (IV SOLUTION) ×3 IMPLANT
YANKAUER SUCT BULB TIP NO VENT (SUCTIONS) ×1 IMPLANT

## 2013-06-13 NOTE — Preoperative (Signed)
Beta Blockers   Reason not to administer Beta Blockers:Not Applicable 

## 2013-06-13 NOTE — H&P (View-Only) (Signed)
[name] is a 32 y.o. female G0P0 who presents for laparoscopic ovarian cystectomy because of pelvic pain and large ovarian cyst. Patient has had a Mirena IUD for over two years and reports recent irregular bleeding (3 times a month). Though the bleeding has not been heavy, it has lasted as long as 7 days and is accompanied by severe cramping.  She rates the cramping as a 10/10 on a 10 point pain scale with some relief when she takes Vicodin.  During her evaluation for irregular bleeding and worsening pelvic pain, she was found to have a uterus: 4.9 x 2.8 x 4.4 cm with IUD in proper orientation per 3D imaging with normal appearing ovaries except that the right ovary contained a 6.8 x 4.7 x 6.3 cm simple cyst with normal color Doppler flow.  The patient admits to urinary frequency but denies dysuria, incontinence, constipation, diarrhea or vaginitis symptoms.  Since she has interstitial cystits, she's not certain if her pelvic pain, at times is because of the cyst or her bladder but she does have increased discomfort with any "jarring"  movements.  Given her  increased discomfort, along with the findings on ultrasound,  the patient wants to proceed with removal of the ovarian cyst.  Past Medical History  OB History: G0P0   GYN History: menarche: 32 YO    LMP: 05/28/2013    Contracepton: Tubal Sterilization; Denies history of abnormal PAP smear or STD;  Last PAP smear: 05/2013  Medical History:  Bipolar disorder, migraine, interstitial cystitis, PTSD, anxiety, chronic back pain, IBS,  Surgical History: Bunionectomy, Interstim Placement, Colon Resection and Tubal Sterilization Denies problems with anesthesia or  history of blood transfusions  Family History: Stroke, diabetes mellitus, esophageal cancer, depression, anxiety and hypertension  Social History:  Single and is on disability;  Denies tobacco but admits to occasional alcohol intake Medications:  Buproprion 75 mg Doxazosin 4 mg Epi  Pen Vicodin 3/325 mg Zofran 8 mg Sumatriptan 100 mg Tramadol 50 mg Zonisamide 100 mg   Allergies  Allergen Reactions  . Augmentin [Amoxicillin-Pot Clavulanate] Nausea And Vomiting  . Ciprofloxacin Nausea Only  . Erythromycin Nausea And Vomiting    severe  . Gabapentin Other (See Comments)    SI INTENTIONS  . Morphine And Related Itching  . Other Other (See Comments)    Medication:Urised Reaction:"hurts bladder"  . Prozac [Fluoxetine Hcl]     SI  . Pyridium [Phenazopyridine Hcl]     Increased bladder pain   Denies sensitivity to peanuts, shellfish, soy, latex or adhesives.  ROS: Admits to glasses, tension headaches (in addition to her migraines), occasional nausea and vomiting in relation to stress, urinary frequency but   denies  vision changes, nasal congestion, dysphagia, tinnitus, dizziness, hoarseness, cough,  chest pain, shortness of breath,  diarrhea,constipation,  urinary urgency  dysuria, hematuria, vaginitis symptoms, swelling of joints,easy bruising,  myalgias, arthralgias, skin rashes, unexplained weight loss and except as is mentioned in the history of present illness, patient's review of systems is otherwise negative.  Physical Exam   Bp: 92/50    P: 64     R: 12     Temperature: 98.8 degrees F orally    Weight:  138 lbs.  Height: 5' 2"    BMI: 25.2  Neck: supple without masses or thyromegaly Lungs: clear to auscultation Heart: regular rate and rhythm Abdomen: soft, non-tender and no organomegaly Pelvic:EGBUS- wnl; vagina-normal rugae; uterus-normal size, cervix without lesions or motion tenderness; adnexae- tender with no detectable   masses though exam was limited by patient discomfort. Extremities:  no clubbing, cyanosis or edema   Assesment:  Large Ovarian Cyst             Pelvic Pain   Disposition:  A discussion was held with patient regarding the indication for her procedure(s) along with the risks, which include but are not limited to: reaction to  anesthesia, damage to adjacent organs, infection, removal of ovary and excessive bleeding. The patient verbalized understanding of these risks and has consented to proceed with Laparoscopic Ovarian Cystectomy at [facility] on June 13, 2013 at noon.   CSN# [id number]   Susi Goslin J. Noland Pizano, PA-C  for Dr. Angela Y. Roberts   

## 2013-06-13 NOTE — Anesthesia Preprocedure Evaluation (Addendum)
Anesthesia Evaluation  Patient identified by MRN, date of birth, ID band Patient awake    Reviewed: Allergy & Precautions, H&P , Patient's Chart, lab work & pertinent test results, reviewed documented beta blocker date and time   History of Anesthesia Complications Negative for: history of anesthetic complications  Airway Mallampati: II TM Distance: >3 FB Neck ROM: full    Dental   Pulmonary former smoker,  breath sounds clear to auscultation        Cardiovascular Exercise Tolerance: Good Rhythm:regular Rate:Normal     Neuro/Psych  Headaches, PSYCHIATRIC DISORDERS Anxiety Depression  Neuromuscular disease    GI/Hepatic GERD-  ,  Endo/Other    Renal/GU      Musculoskeletal   Abdominal   Peds  Hematology   Anesthesia Other Findings Chronic back pain  Reproductive/Obstetrics                          Anesthesia Physical Anesthesia Plan  ASA: III  Anesthesia Plan: General ETT   Post-op Pain Management:    Induction:   Airway Management Planned:   Additional Equipment:   Intra-op Plan:   Post-operative Plan:   Informed Consent: I have reviewed the patients History and Physical, chart, labs and discussed the procedure including the risks, benefits and alternatives for the proposed anesthesia with the patient or authorized representative who has indicated his/her understanding and acceptance.   Dental Advisory Given  Plan Discussed with: CRNA and Surgeon  Anesthesia Plan Comments:        Anesthesia Quick Evaluation

## 2013-06-13 NOTE — Transfer of Care (Signed)
Immediate Anesthesia Transfer of Care Note  Patient: [name]  Procedure(s) Performed: Procedure(s): LAPAROSCOPY OPERATIVE WITH OVARIAN CYSTECTOMY convert to open 1329 (Right) EXPLORATORY LAPAROTOMY, ovarian cyst (Right)  Patient Location: PACU  Anesthesia Type:General  Level of Consciousness: awake  Airway & Oxygen Therapy: Patient Spontanous Breathing  Post-op Assessment: Report given to PACU RN  Post vital signs: stable  Filed Vitals:   06/13/13 1750  BP: 130/81  Pulse: 114  Temp: 36.5 C  Resp: 14    Complications: No apparent anesthesia complications

## 2013-06-13 NOTE — Interval H&P Note (Signed)
History and Physical Interval Note:  06/13/2013 11:45 AM  [name]  has presented today for surgery, with the diagnosis of Ovarian Cyst  The various methods of treatment have been discussed with the patient and family. After consideration of risks, benefits and other options for treatment, the patient has consented to  Procedure(s): LAPAROSCOPY OPERATIVE WITH OVARIAN CYSTECTOMY (N/A)/POSSIBLE OOPHORECTOMY as a surgical intervention .  The patient's history has been reviewed, patient examined, no change in status, stable for surgery.  I have reviewed the patient's chart and labs.  Questions were answered to the patient's satisfaction.     Purcell Nails

## 2013-06-13 NOTE — Anesthesia Postprocedure Evaluation (Signed)
  Anesthesia Post Note  Patient: [name]  Procedure(s) Performed: Procedure(s) (LRB): LAPAROSCOPY OPERATIVE WITH OVARIAN CYSTECTOMY convert to open 1329 (Right) EXPLORATORY LAPAROTOMY, ovarian cyst (Right)  Anesthesia type: GA  Patient location: PACU  Post pain: Pain level controlled although difficult to control  Post assessment: Post-op Vital signs reviewed  Last Vitals:  Filed Vitals:   06/13/13 1615  BP:   Pulse: 107  Temp:   Resp: 32    Post vital signs: Reviewed  Level of consciousness: sedated  Complications: No apparent anesthesia complications

## 2013-06-14 LAB — CBC
HCT: 29.5 % — ABNORMAL LOW (ref 36.0–46.0)
Hemoglobin: 9.9 g/dL — ABNORMAL LOW (ref 12.0–15.0)
MCH: 29.7 pg (ref 26.0–34.0)
MCHC: 33.6 g/dL (ref 30.0–36.0)
MCV: 88.6 fL (ref 78.0–100.0)
Platelets: 189 10*3/uL (ref 150–400)
RBC: 3.33 MIL/uL — ABNORMAL LOW (ref 3.87–5.11)
RDW: 13.1 % (ref 11.5–15.5)
WBC: 6.1 10*3/uL (ref 4.0–10.5)

## 2013-06-14 LAB — BASIC METABOLIC PANEL
BUN: 5 mg/dL — ABNORMAL LOW (ref 6–23)
CO2: 28 mEq/L (ref 19–32)
Calcium: 8.3 mg/dL — ABNORMAL LOW (ref 8.4–10.5)
Chloride: 105 mEq/L (ref 96–112)
Creatinine, Ser: 0.68 mg/dL (ref 0.50–1.10)
GFR calc Af Amer: >90 mL/min (ref >90)
GFR calc non Af Amer: >90 mL/min (ref >90)
Glucose, Bld: 97 mg/dL (ref 70–99)
Potassium: 3.8 mEq/L (ref 3.5–5.1)
Sodium: 140 mEq/L (ref 135–145)

## 2013-06-14 MED ORDER — OXYCODONE-ACETAMINOPHEN 5-325 MG PO TABS: 1.0000 | ORAL_TABLET | Freq: Four times a day (QID) | ORAL | Status: DC | PRN

## 2013-06-14 MED ORDER — METOCLOPRAMIDE HCL 5 MG/ML IJ SOLN: 10.0000 mg | Freq: Four times a day (QID) | INTRAMUSCULAR | Status: DC | PRN

## 2013-06-14 MED ORDER — OXYCODONE-ACETAMINOPHEN 5-325 MG PO TABS
1.0000 | ORAL_TABLET | ORAL | Status: DC | PRN
Start: 2013-06-14 — End: 2013-06-15
  Administered 2013-06-14 – 2013-06-15 (×4): 2 via ORAL
  Filled 2013-06-14 (×4): qty 2

## 2013-06-14 MED FILL — Heparin Sodium (Porcine) Inj 5000 Unit/ML: INTRAMUSCULAR | Qty: 1 | Status: AC

## 2013-06-14 NOTE — Progress Notes (Signed)
Ur chart review completed.  

## 2013-06-14 NOTE — Anesthesia Postprocedure Evaluation (Signed)
  Anesthesia Post-op Note  Patient: [name]  Procedure(s) Performed: Procedure(s): LAPAROSCOPY OPERATIVE WITH OVARIAN CYSTECTOMY convert to open 1329 (Right) EXPLORATORY LAPAROTOMY, ovarian cyst (Right)  Patient Location: [facility] unit  Anesthesia Type:General  Level of Consciousness: awake, alert  and oriented  Airway and Oxygen Therapy: Patient Spontanous Breathing and Patient connected to nasal cannula oxygen  Post-op Pain: none  Post-op Assessment: Post-op Vital signs reviewed and Patient's Cardiovascular Status Stable  Post-op Vital Signs: Reviewed and stable  Complications: No apparent anesthesia complications

## 2013-06-14 NOTE — Discharge Summary (Signed)
Physician Discharge Summary  Patient ID: [name] MRN: [id number] DOB/AGE: [birth year] 32 y.o.  Admit date: 06/13/2013 Discharge date: 06/14/2013  Admission Diagnoses:1.Pelvic Pain 2.Right Ovarian Cyst  Discharge Diagnoses: 1.Pelvic Pain 2.Right Ovarian Cyst 3.Adhesions 4.Probable Endometriosis 5.h/o Bowel Resection 6.h/o Narcotic Addiction  Discharged Condition: good  Hospital Course: s/p Laparotomy with right ovarian cystectomy, LOA, converted from Laparoscopy  Consults: None  Significant Diagnostic Studies: labs: Hgb 9.9 at discharge  Treatments: IV hydration, analgesia: Dilaudid and percocet and ibuprofen and surgery: as above  Hospital Course:  On the date of admission the patient underwent the aforementioned procedures and tolerated them well.  Post operative course was unremarkable with the patient resuming bowel and bladder function by post operative day #2 and was therefore deemed ready for discharge home.  Discharge Exam: Blood pressure 105/60, pulse 77, temperature 98.7 F (37.1 C), temperature source Oral, resp. rate 18, height 5\' 2"  (1.575 m), weight 59.875 kg (132 lb), last menstrual period 06/01/2013, SpO2 99.00%. General appearance: alert and no distress Resp: clear to auscultation bilaterally Cardio: regular rate and rhythm GI: soft, app tender, ND, NABS, no guarding or rebound Extremities: extremities normal, atraumatic, no cyanosis or edema, nontender Incision/Wound:incision c/d and dressed with dermabond and honeycomb dressing  Disposition: 01-Home or Self Care   Future Appointments Provider Department Dept Phone   11/01/2013 9:00 AM Nilda Riggs, NP [facility] [phone]       Medication List         buPROPion 75 MG tablet  Commonly known as:  WELLBUTRIN  75 mg every morning.     doxazosin 4 MG tablet  Commonly known as:  CARDURA  Take 4 mg by mouth at bedtime as needed and may repeat dose one time if needed.     HYDROcodone-acetaminophen 5-325 MG per tablet  Commonly known as:  NORCO/VICODIN  Take 1-2 pills every 4-6 hours as needed for pain.     ibuprofen 600 MG tablet  Commonly known as:  ADVIL,MOTRIN  Take 600 mg by mouth every 6 (six) hours as needed for headache, mild pain or moderate pain.     levocetirizine 5 MG tablet  Commonly known as:  XYZAL  Take 5 mg by mouth every evening.     levonorgestrel 20 MCG/24HR IUD  Commonly known as:  MIRENA  1 each by Intrauterine route once.     ondansetron 8 MG tablet  Commonly known as:  ZOFRAN  Take 0.5 tablets (4 mg total) by mouth every 8 (eight) hours as needed for nausea. For nausea     oxyCODONE-acetaminophen 5-325 MG per tablet  Commonly known as:  PERCOCET/ROXICET  Take 1-2 tablets by mouth every 6 (six) hours as needed for moderate pain.     SUMAtriptan 100 MG tablet  Commonly known as:  IMITREX  Take 1 tablet (100 mg total) by mouth 2 (two) times daily as needed for migraine.     traMADol 50 MG tablet  Commonly known as:  ULTRAM  Take 50 mg by mouth every 8 (eight) hours as needed for pain.     traZODone 50 MG tablet  Commonly known as:  DESYREL  Take 150 mg by mouth at bedtime and may repeat dose one time if needed. For depression/sleep     zonisamide 100 MG capsule  Commonly known as:  ZONEGRAN  Take 100 mg by mouth 2 (two) times daily.           Follow-up Information   Follow up  with Purcell Nails, MD In 6 weeks. (for post op appt)    Specialty:  Obstetrics and Gynecology   Contact information:   [address] [place] [address] [phone]     Post operative appointment is July 16, 2013 at 4:30 p.m.  SignedPurcell Nails 06/14/2013, 5:18 PM

## 2013-06-14 NOTE — Op Note (Signed)
Preop Diagnosis: Right Ovarian Cyst   Postop Diagnosis: 1.Right Ovarian Cyst 2.Adhesions  Procedure: 1.Dx LAPAROSCOPY OVARIAN CYSTECTOMY 2.LAPAROTOMY 3.RIGHT OVARIAN CYSTECTOMY 4.LOA 5.h/o BOWEL RESECTION  Anesthesia: General   Anesthesiologist: Brayton Caves, MD   Attending: Purcell Nails, MD   Assistant:  Henreitta Leber, PA-C  Findings: Rt ovarian cyst.  Both ovaries had kidney shaped appearance.  The rt cyst appeared mildly inflamed.  She looked like she had a spot of endometriosis on the uterus.  Adhesions of bowel to rt ovary.  Pathology: 1.Pelvic Washings 2.Rt Ovarian Cyst Wall  Fluids: See Flowsheet  UOP: See Flowsheet  EBL: See Flowsheet  Complications: None  Procedure: The patient was taken to the operating room after the risks, benefits, alternatives, complications, treatment options, and expected outcomes were discussed with the patient. The patient verbalized understanding, the patient concurred with the proposed plan and consent signed and witnessed. The patient was taken to the Operating Room, identified as [name] and the procedure verified as laparoscopic right ovarian cystectomy.  The patient was placed under general anesthesia per anesthesia staff, the patient was placed in modified dorsal lithotomy position and was prepped, draped, and foley catheter placed in the normal, sterile fashion.   A Time Out was held and the above information confirmed.  The cervix was visualized and an intrauterine manipulator (acorn) was placed.  A 10 mm umbilical incision was then performed. Veress needle was passed with a lot of resistance at site of prior scar.  Open laparoscopy was performed by incising the adhesions down to the level of the fascia and peritoneum entered with hemostat. A 10 mm trocar was advanced into the intraabdominal cavity, the operative laparoscope was introduced and good placement noted.  Pneumoperitoneum was established and findings as noted  above.  Patient was placed in trendelenburg and marcaine injected in the RLQ and a 5 mm incision was made and 5 mm trocar advanced into the intraabdominal cavity.  The same was done in the suprapubic area (except incision and trocar were 10mm) and in the LLQ.  After lysing some adhesions and noting the difficulty at isolating the cyst with the bowel adherent, the decision was made to convert to laparotomy.  Prior to converting to laparotomy what appeared to be a hemorrhagic corpus luteal cyst ruptured with some bleeding and pelvic washings collected.  A pfannenstiel incision was made and carried down to the fascia which was incised bilaterally with the bovie and extended with the mayo scissors.  Kocher clamps were placed on the inferior aspect of the fascial incision and the rectus muscle excised from the fascia.  The same was done on the superior aspect of the fascial incision.  The peritoneum was tented up and entered with the metzenbaum scissors in a transparent area.  The incision was extended manually.  Three laparotomy sponges were placed and O'Connor O'Sullivan self-retaining retractor placed.  The rt ovary was grasped with the babcock and incision made over the inflamed cystic area and cyst ruptured and fluid leaked out.  After removing cyst, the bed was cauterized with the bovie and good hemostasis was obtained.  Surgicel was placed in this area as well to ensure hemostasis.  Attention was then turned to the corpus luteal cyst which was still oozing and it was noted to be right at the hilum of the ovary.  This area was cauterized extensively until hemostasis was achieved and surgicel placed on the inside of the cyst and on the outer surface as well to  ensure hemostasis.  The intraabdominal cavity was copiously irrigated and hemostasis was noted.  The peritoneum was repaired with 2-0 chromic after removing three laparotomy sponges and the retractor.  The fascia was repaired with 0 vicryl using two  stitches.  The subcutaneous tissue was reapproximated with 3 interrupted stitches of 2-0 plain.  The skin was reapproximated with 3-0 monocryl.  The fascia at the umbilicus was repaired with 0 vicryl and the skin reapproximated with 4-0 monocryl.  The 5mm incisions were repaired with dermabond and dermabond applied to the umbilical incision and the lateral edges of the pfannenstiel incision.  The acorn was removed.  Sponge lap and needle count was corrected.  The patient tolerated the procedure well and was awaiting transfer to the recovery room in good condition.

## 2013-06-14 NOTE — Progress Notes (Signed)
[name] is a32 y.o.  [id number]  Post Op Date # 1;  Diagnostic Laparoscopy/Laparotomy with Right Ovarian Cystectomy/LOA  Subjective: Patient is Doing well postoperatively. Patient has good pain control with NSAIDs and Hydrocodone/Acetaminophen., has had transient nausea-now resolved. Diet:regular diet Patient has voided without difficulty and ambulated in the halls.  Objective: Vital signs in last 24 hours: Temp:  [97.3 F (36.3 C)-99.3 F (37.4 C)] 99.3 F (37.4 C) (12/04 0607) Pulse Rate:  [80-118] 90 (12/04 0607) Resp:  [12-32] 16 (12/04 0607) BP: (81-130)/(51-82) 81/51 mmHg (12/04 0608) SpO2:  [94 %-100 %] 98 % (12/04 0607) Weight:  [132 lb (59.875 kg)] 132 lb (59.875 kg) (12/03 1700)  Intake/Output from previous day: 12/03 0701 - 12/04 0700 In: 4313.6 [P.O.:660; I.V.:3653.6] Out: 2975 [Urine:2725] Intake/Output this shift: Total I/O In: 1313.6 [P.O.:660; I.V.:653.6] Out: 1675 [Urine:1675]  Recent Labs Lab 06/13/13 1057 06/13/13 1838 06/14/13 0535  WBC 5.2 11.5* 6.1  HGB 12.8 11.3* 9.9*  HCT 38.0 33.3* 29.5*  PLT 248 222 189     Recent Labs Lab 06/14/13 0535  NA 140  K 3.8  CL 105  CO2 28  BUN 5*  CREATININE 0.68  CALCIUM 8.3*  GLUCOSE 97    EXAM: General: alert, cooperative and no distress Resp: clear to auscultation bilaterally Cardio: regular rate and rhythm, S1, S2 normal, no murmur, click, rub or gallop GI: Soft, bandage intact and dry but visible stain right aspect of honeycomb bandage. Extremities: No calf tenderness and negative Homan's Patient removed SCD hose   Assessment: s/p Procedure(s): LAPAROSCOPY OPERATIVE WITH OVARIAN CYSTECTOMY convert to open 1329 EXPLORATORY LAPAROTOMY, ovarian cyst: stable, progressing well and anemia  Plan: Encourage ambulation Routine care.  LOS: 1 day    POWELL,ELMIRA, PA-C 06/14/2013 6:41 AM  Doing well.  Has ambulated several times without light headedness or dizziness.  Has not passed  flatus but tolerating po without nausea or vomiting.  Her pain is under good control despite her prior h/o narcotic addiction.  Pt given d/c instructions for d/c tomorrow.  Pt asked to go home tonight but I would like to continue to observe overnight.  She has good bowel sounds.  Will send home with percocet and she already has ibuprofen at home.

## 2013-06-14 NOTE — Clinical Social Work Psychosocial (Signed)
    Clinical Social Work Department BRIEF PSYCHOSOCIAL ASSESSMENT 06/14/2013  Patient:  [name], [name]     Account Number:  [id number]     Admit date:  06/13/2013  Clinical Social Worker:  Melene Plan  Date/Time:  06/14/2013 01:13 PM  Referred by:  RN  Date Referred:  06/14/2013 Referred for  Behavioral Health Issues   Other Referral:   Bipolar disorder, PTSD, Anxiety & SI history   Interview type:  Patient Other interview type:    PSYCHOSOCIAL DATA Living Status:  ALONE Admitted from facility:   Level of care:   Primary support name:  [name], Grandmother Primary support relationship to patient:   Degree of support available:   Involved    CURRENT CONCERNS Current Concerns  Behavioral Health Issues   Other Concerns:    SOCIAL WORK ASSESSMENT / PLAN CSW referral received to assess pt's current social situation after bedside RN, expressed concern.  Documentation from 2/14 [facility] admission), noted by this CSW.  Pt lives home alone.  She identifies her grandmother as her primary support person.  She has an extensive psych history.  She is currently being treated by "Dr. Mervyn Skeeters," who she sees once a month or twice a month if needed.  Now, she states she is doing well & therefore is only seeing him once a month.  Pt also has a psychologist, "Florentina Addison" who she is seeing.  Pt was not able to verbalize the names of the agencies or offices that either mental health professional works.  Her mental illnesses are being treated with Wellbutrin & Trazodone, both of which are helpful.  Pt denies SI, noted earlier this year.  She explained that she was in a lot of pain because of an urinary tract infection & became dehydrated. She started to "sleep walk" & took some pills.  She remembers throwing the pills up, so she took more & fell. When she woke up, she had two black eyes.  Even though pt was hospitalized, she said her psychiatrist said it was because she was dehydrated & wasn't aware of her  actions. It was difficult for CSW to gain a concrete understanding of the situation.  Pt's accounts of incident & documentation (2/14), differ.  She plans to follow up with mental health treatment & denies any SI now.  Pt states she is ready to discharge & asked CSW to call MD.  CSW informed RN of pt's request.   Assessment/plan status:  No Further Intervention Required Other assessment/ plan:   Information/referral to community resources:   Pt will continue mental health treatment in the community.    PATIENT'S/FAMILY'S RESPONSE TO PLAN OF CARE: Pt seems eager to discharge & does not identify any resources from this CSW.

## 2013-06-14 NOTE — Progress Notes (Signed)
Pt refused to wear the ETCO2 monitor at night. Pt is tolerating PO fluids and voiding. PCA d/c.

## 2013-06-14 NOTE — Progress Notes (Signed)
I met Arabela and her mother when she was walking in the hallway.  She requested prayer that she will soon be home and that her pain will continue to decrease.  I offered prayer and spiritual support.  [employer] Pager, [phone] 1:47 PM   06/14/13 1300  Clinical Encounter Type  Visited With Patient and family together  Visit Type Spiritual support;Initial  Spiritual Encounters  Spiritual Needs Prayer;Emotional

## 2013-06-15 ENCOUNTER — Encounter (HOSPITAL_COMMUNITY): Payer: Self-pay | Admitting: Obstetrics and Gynecology

## 2013-06-15 NOTE — Progress Notes (Signed)
[name] is a32 y.o.  [id number]  Post Op Date # 2; Diagnostic Laparoscopy with Exploratory Laparotomy, Right Ovarian Cystectomy and LOA  Subjective: Patient is Doing well postoperatively. Patient has soreness only, relaying that pain is under good control., Tolerating regular diet, passed flatus, voiding and ambulating without difficulty.   Objective: Vital signs in last 24 hours: Temp:  [98.3 F (36.8 C)-98.9 F (37.2 C)] 98.3 F (36.8 C) (12/05 0507) Pulse Rate:  [77-84] 84 (12/05 0507) Resp:  [15-18] 15 (12/05 0507) BP: (94-105)/(52-64) 100/52 mmHg (12/05 0507) SpO2:  [98 %-100 %] 100 % (12/05 0507)  Intake/Output from previous day: 12/04 0701 - 12/05 0700 In: -  Out: 175 [Urine:175] Intake/Output this shift:    Recent Labs Lab 06/13/13 1057 06/13/13 1838 06/14/13 0535  WBC 5.2 11.5* 6.1  HGB 12.8 11.3* 9.9*  HCT 38.0 33.3* 29.5*  PLT 248 222 189     Recent Labs Lab 06/14/13 0535  NA 140  K 3.8  CL 105  CO2 28  BUN 5*  CREATININE 0.68  CALCIUM 8.3*  GLUCOSE 97    EXAM: General: alert, cooperative and no distress Resp: clear to auscultation bilaterally Cardio: regular rate and rhythm, S1, S2 normal, no murmur, click, rub or gallop GI: Soft, bowel sounds present, incision with good wound edge approximation, without evidence of infection; area around incision with mild diffuse ecchemosis. Extremities: Homans sign is negative, no sign of DVT Vaginal Bleeding: Scant dried brown stain.   Assessment: s/p Procedure(s): LAPAROSCOPY OPERATIVE WITH OVARIAN CYSTECTOMY convert to open 1329 EXPLORATORY LAPAROTOMY, ovarian cyst: stable and anemia which pt is tolerating well  Plan: Discharge home  LOS: 2 days    POWELL,ELMIRA, PA-C 06/15/2013 7:33 AM

## 2013-06-15 NOTE — Progress Notes (Signed)
Pt ambulated out  Teaching complete  Home in taxi with grandmother

## 2013-06-19 ENCOUNTER — Encounter (HOSPITAL_COMMUNITY): Payer: Self-pay

## 2013-06-19 ENCOUNTER — Inpatient Hospital Stay (HOSPITAL_COMMUNITY)
Admission: AD | Admit: 2013-06-19 | Discharge: 2013-06-19 | Disposition: A | Discharge status: Home / Self Care | Payer: Medicare Other | Source: Ambulatory Visit | Attending: Obstetrics and Gynecology | Admitting: Obstetrics and Gynecology

## 2013-06-19 DIAGNOSIS — R1031 Right lower quadrant pain: Secondary | ICD-10-CM | POA: Insufficient documentation

## 2013-06-19 DIAGNOSIS — R110 Nausea: Secondary | ICD-10-CM | POA: Insufficient documentation

## 2013-06-19 DIAGNOSIS — N949 Unspecified condition associated with female genital organs and menstrual cycle: Secondary | ICD-10-CM | POA: Insufficient documentation

## 2013-06-19 LAB — CBC
HCT: 29.1 % — ABNORMAL LOW (ref 36.0–46.0)
Hemoglobin: 9.7 g/dL — ABNORMAL LOW (ref 12.0–15.0)
MCH: 29.8 pg (ref 26.0–34.0)
MCHC: 33.3 g/dL (ref 30.0–36.0)
MCV: 89.5 fL (ref 78.0–100.0)
Platelets: 277 10*3/uL (ref 150–400)
RBC: 3.25 MIL/uL — ABNORMAL LOW (ref 3.87–5.11)
RDW: 13.2 % (ref 11.5–15.5)
WBC: 5.2 10*3/uL (ref 4.0–10.5)

## 2013-06-19 LAB — URINALYSIS, ROUTINE W REFLEX MICROSCOPIC
Bilirubin Urine: NEGATIVE
Glucose, UA: NEGATIVE mg/dL
Ketones, ur: NEGATIVE mg/dL
Leukocytes, UA: NEGATIVE
Nitrite: NEGATIVE
Protein, ur: NEGATIVE mg/dL
Specific Gravity, Urine: 1.01 (ref 1.005–1.030)
Urobilinogen, UA: 0.2 mg/dL (ref 0.0–1.0)
pH: 5.5 (ref 5.0–8.0)

## 2013-06-19 LAB — URINE MICROSCOPIC-ADD ON

## 2013-06-19 MED ORDER — METOCLOPRAMIDE HCL 10 MG PO TABS
10.0000 mg | ORAL_TABLET | Freq: Once | ORAL | Status: AC
  Administered 2013-06-19: 10 mg via ORAL
  Filled 2013-06-19: qty 1

## 2013-06-19 MED ORDER — METOCLOPRAMIDE HCL 10 MG PO TABS: 10.0000 mg | ORAL_TABLET | Freq: Four times a day (QID) | ORAL | Status: DC | PRN

## 2013-06-19 MED ORDER — HYDROMORPHONE HCL PF 1 MG/ML IJ SOLN
2.0000 mg | Freq: Once | INTRAMUSCULAR | Status: AC
  Administered 2013-06-19: 2 mg via INTRAMUSCULAR
  Filled 2013-06-19: qty 2

## 2013-06-19 NOTE — MAU Provider Note (Signed)
History     CSN: [id number]  Arrival date and time: 06/19/13 0209   None     Chief Complaint  Patient presents with  . Pelvic Pain  . Post-op Problem   HPI Comments: Pt is a 32yo s/p laparotomy on 12/2 for R ovarian cyst. Arrives tonight after calling on call provider w c/o abdominal pain that started this evening, as well as nausea, no vomiting. Last ate at about noon. Last took percocet and motrin at midnight without any relief. Reports having regular BM's, last one today. Pt states pain is localized to RLQ. Nothing seems to help the pain and moving or changing positions makes it worse. Pt denies having done any extra activity today, except taking a shower.   Abdominal Pain This is a new problem. The current episode started today. The onset quality is gradual. The problem occurs constantly. The problem has been unchanged. The pain is located in the RLQ. The pain is at a severity of 10/10. The quality of the pain is sharp. The abdominal pain does not radiate. The pain is aggravated by certain positions. The pain is relieved by nothing. She has tried oral narcotic analgesics for the symptoms. The treatment provided no relief. Her past medical history is significant for abdominal surgery and irritable bowel syndrome.     Past Medical History  Diagnosis Date  . Bipolar 1 disorder   . Migraine   . Mental disorder   . Anxiety   . Interstitial cystitis   . Galactorrhea   . Chronic back pain   . Seasonal allergies   . Sinusitis   . IBS (irritable bowel syndrome)   . IC (interstitial cystitis)   . Insomnia   . GERD (gastroesophageal reflux disease)     Past Surgical History  Procedure Laterality Date  . Tubal ligation    . Bunionectomy    . Interstim implant placement    . Bowel resection  1998    prolapsed bowel  . Foot surgery      bunionectomy bil. pins put in and taken out  . Bladder surgery      distenstion numerous times  . Laparoscopy Right 06/13/2013    Procedure:  LAPAROSCOPY OPERATIVE WITH OVARIAN CYSTECTOMY convert to open 1329;  Surgeon: Purcell Nails, MD;  Location: [facility];  Service: Gynecology;  Laterality: Right;  . Laparotomy Right 06/13/2013    Procedure: EXPLORATORY LAPAROTOMY, ovarian cyst;  Surgeon: Purcell Nails, MD;  Location: [facility];  Service: Gynecology;  Laterality: Right;  . Intrauterine device insertion  2 yrs ago    Mirena    Family History  Problem Relation Age of Onset  . Hypertension Mother   . Esophageal cancer Maternal Grandmother   . Hypertension Maternal Grandmother   . Heart disease Maternal Grandmother   . Stomach cancer Maternal Grandmother   . Diabetes Maternal Grandfather   . Stroke Maternal Grandfather   . Depression Maternal Grandfather   . Anxiety disorder Maternal Grandfather   . Hypertension Maternal Grandfather   . Heart disease Maternal Grandfather   . Heart disease Other   . Diabetes Father   . Heart disease Father   . Stroke Father     History  Substance Use Topics  . Smoking status: Former Smoker -- 2.5 years    Types: Cigars    Quit date: 06/06/2001  . Smokeless tobacco: Never Used  . Alcohol Use: No     Comment: only  on holidays    Allergies:  Allergies  Allergen Reactions  . Augmentin [Amoxicillin-Pot Clavulanate] Nausea And Vomiting  . Ciprofloxacin Nausea Only  . Erythromycin Nausea And Vomiting    severe  . Gabapentin Other (See Comments)    SI INTENTIONS  . Morphine And Related Itching  . Other Other (See Comments)    Medication:Urised Reaction:"hurts bladder"  . Prozac [Fluoxetine Hcl]     SI  . Pyridium [Phenazopyridine Hcl]     Increased bladder pain    Prescriptions prior to admission  Medication Sig Dispense Refill  . buPROPion (WELLBUTRIN) 75 MG tablet 75 mg every morning.      Marland Kitchen doxazosin (CARDURA) 4 MG tablet Take 4 mg by mouth at bedtime as needed and may repeat dose one time if needed.      Marland Kitchen HYDROcodone-acetaminophen (NORCO/VICODIN) 5-325 MG per tablet  Take 1-2 pills every 4-6 hours as needed for pain.  15 tablet  0  . ibuprofen (ADVIL,MOTRIN) 600 MG tablet Take 600 mg by mouth every 6 (six) hours as needed for headache, mild pain or moderate pain.      Marland Kitchen levocetirizine (XYZAL) 5 MG tablet Take 5 mg by mouth every evening.      Marland Kitchen levonorgestrel (MIRENA) 20 MCG/24HR IUD 1 each by Intrauterine route once.      . ondansetron (ZOFRAN) 8 MG tablet Take 0.5 tablets (4 mg total) by mouth every 8 (eight) hours as needed for nausea. For nausea  20 tablet    . oxyCODONE-acetaminophen (PERCOCET/ROXICET) 5-325 MG per tablet Take 1-2 tablets by mouth every 6 (six) hours as needed for moderate pain.  30 tablet  0  . SUMAtriptan (IMITREX) 100 MG tablet Take 1 tablet (100 mg total) by mouth 2 (two) times daily as needed for migraine.  9 tablet  5  . traMADol (ULTRAM) 50 MG tablet Take 50 mg by mouth every 8 (eight) hours as needed for pain.       . traZODone (DESYREL) 50 MG tablet Take 150 mg by mouth at bedtime and may repeat dose one time if needed. For depression/sleep      . zonisamide (ZONEGRAN) 100 MG capsule Take 100 mg by mouth 2 (two) times daily.        Review of Systems  Gastrointestinal: Positive for abdominal pain.  All other systems reviewed and are negative.   Physical Exam   Blood pressure 115/71, pulse 90, temperature 98.5 F (36.9 C), temperature source Oral, resp. rate 18, last menstrual period 06/01/2013.  Physical Exam  Nursing note and vitals reviewed. Constitutional: She is oriented to person, place, and time. She appears well-developed and well-nourished. No distress.  HENT:  Head: Normocephalic.  Eyes: Pupils are equal, round, and reactive to light.  Neck: Normal range of motion.  Cardiovascular: Normal rate, regular rhythm and normal heart sounds.   Respiratory: Effort normal and breath sounds normal.  GI: Soft. Bowel sounds are normal. She exhibits no distension. There is no tenderness. There is no rebound and no guarding.   Bruising at incisional site noted.  Incisions appear to be healing well,   Musculoskeletal: Normal range of motion.  Neurological: She is alert and oriented to person, place, and time. She has normal reflexes.  Skin: Skin is warm and dry.  Psychiatric: She has a normal mood and affect. Her behavior is normal.   Results for orders placed during the hospital encounter of 06/19/13 (from the past 24 hour(s))  URINALYSIS, ROUTINE W REFLEX MICROSCOPIC     Status: Abnormal  Collection Time    06/19/13  2:24 AM      Result Value Range   Color, Urine YELLOW  YELLOW   APPearance CLEAR  CLEAR   Specific Gravity, Urine 1.010  1.005 - 1.030   pH 5.5  5.0 - 8.0   Glucose, UA NEGATIVE  NEGATIVE mg/dL   Hgb urine dipstick TRACE (*) NEGATIVE   Bilirubin Urine NEGATIVE  NEGATIVE   Ketones, ur NEGATIVE  NEGATIVE mg/dL   Protein, ur NEGATIVE  NEGATIVE mg/dL   Urobilinogen, UA 0.2  0.0 - 1.0 mg/dL   Nitrite NEGATIVE  NEGATIVE   Leukocytes, UA NEGATIVE  NEGATIVE  URINE MICROSCOPIC-ADD ON     Status: None   Collection Time    06/19/13  2:24 AM      Result Value Range   Squamous Epithelial / LPF RARE  RARE   WBC, UA 0-2  <3 WBC/hpf   RBC / HPF 0-2  <3 RBC/hpf   Bacteria, UA RARE  RARE  CBC     Status: Abnormal   Collection Time    06/19/13  3:00 AM      Result Value Range   WBC 5.2  4.0 - 10.5 K/uL   RBC 3.25 (*) 3.87 - 5.11 MIL/uL   Hemoglobin 9.7 (*) 12.0 - 15.0 g/dL   HCT 16.1 (*) 09.6 - 04.5 %   MCV 89.5  78.0 - 100.0 fL   MCH 29.8  26.0 - 34.0 pg   MCHC 33.3  30.0 - 36.0 g/dL   RDW 40.9  81.1 - 91.4 %   Platelets 277  150 - 400 K/uL     MAU Course  Procedures    Assessment and Plan  S/p laparotomy for R ovarian cystectomy on 12/2 by Dr Su Hilt R sided abdominal pain, unrelieved w vicodin  Nausea  Exam appears benign D/w Dr Dion Body Will give dilaudid IM and reglan PO, if no relief, consider CT scan    Tahjae Durr M 06/19/2013, 3:57 AM

## 2013-06-19 NOTE — MAU Note (Signed)
Right lower abd pain after taking a shower around noon.  Pt is post -op , had surgery 06/13/13, to remove right ovarian cyst, reports hx of adhesions and endometriosis. Reports light bleeding today, pinkish red in color.  Denies fever.

## 2013-06-20 LAB — URINE CULTURE
Colony Count: NO GROWTH
Culture: NO GROWTH

## 2013-06-24 ENCOUNTER — Encounter (HOSPITAL_COMMUNITY): Payer: Self-pay | Admitting: *Deleted

## 2013-06-24 ENCOUNTER — Inpatient Hospital Stay (HOSPITAL_COMMUNITY)
Admission: AD | Admit: 2013-06-24 | Discharge: 2013-06-24 | Disposition: A | Discharge status: Home / Self Care | Payer: Medicare Other | Source: Ambulatory Visit | Attending: Obstetrics and Gynecology | Admitting: Obstetrics and Gynecology

## 2013-06-24 DIAGNOSIS — R109 Unspecified abdominal pain: Secondary | ICD-10-CM | POA: Insufficient documentation

## 2013-06-24 DIAGNOSIS — G8918 Other acute postprocedural pain: Secondary | ICD-10-CM | POA: Insufficient documentation

## 2013-06-24 DIAGNOSIS — Z87891 Personal history of nicotine dependence: Secondary | ICD-10-CM | POA: Insufficient documentation

## 2013-06-24 LAB — CBC WITH DIFFERENTIAL/PLATELET
Basophils Absolute: 0 10*3/uL (ref 0.0–0.1)
Basophils Relative: 1 % (ref 0–1)
Eosinophils Absolute: 0.4 10*3/uL (ref 0.0–0.7)
Eosinophils Relative: 5 % (ref 0–5)
HCT: 32.2 % — ABNORMAL LOW (ref 36.0–46.0)
Hemoglobin: 10.9 g/dL — ABNORMAL LOW (ref 12.0–15.0)
Lymphocytes Relative: 20 % (ref 12–46)
Lymphs Abs: 1.6 10*3/uL (ref 0.7–4.0)
MCH: 30.4 pg (ref 26.0–34.0)
MCHC: 33.9 g/dL (ref 30.0–36.0)
MCV: 89.7 fL (ref 78.0–100.0)
Monocytes Absolute: 0.5 10*3/uL (ref 0.1–1.0)
Monocytes Relative: 7 % (ref 3–12)
Neutro Abs: 5.3 10*3/uL (ref 1.7–7.7)
Neutrophils Relative %: 68 % (ref 43–77)
Platelets: 341 10*3/uL (ref 150–400)
RBC: 3.59 MIL/uL — ABNORMAL LOW (ref 3.87–5.11)
RDW: 13.5 % (ref 11.5–15.5)
WBC: 7.8 10*3/uL (ref 4.0–10.5)

## 2013-06-24 LAB — COMPREHENSIVE METABOLIC PANEL
ALT: 8 U/L (ref 0–35)
AST: 14 U/L (ref 0–37)
Albumin: 3.7 g/dL (ref 3.5–5.2)
Alkaline Phosphatase: 64 U/L (ref 39–117)
BUN: 10 mg/dL (ref 6–23)
CO2: 27 mEq/L (ref 19–32)
Calcium: 9.1 mg/dL (ref 8.4–10.5)
Chloride: 101 mEq/L (ref 96–112)
Creatinine, Ser: 0.73 mg/dL (ref 0.50–1.10)
GFR calc Af Amer: >90 mL/min (ref >90)
GFR calc non Af Amer: >90 mL/min (ref >90)
Glucose, Bld: 97 mg/dL (ref 70–99)
Potassium: 4.4 mEq/L (ref 3.5–5.1)
Sodium: 135 mEq/L (ref 135–145)
Total Bilirubin: <0.1 mg/dL — ABNORMAL LOW (ref 0.3–1.2)
Total Protein: 6.2 g/dL (ref 6.0–8.3)

## 2013-06-24 LAB — URINALYSIS, ROUTINE W REFLEX MICROSCOPIC
Bilirubin Urine: NEGATIVE
Glucose, UA: NEGATIVE mg/dL
Hgb urine dipstick: NEGATIVE
Ketones, ur: 15 mg/dL — AB
Leukocytes, UA: NEGATIVE
Nitrite: NEGATIVE
Protein, ur: NEGATIVE mg/dL
Specific Gravity, Urine: >1.03 — ABNORMAL HIGH (ref 1.005–1.030)
Urobilinogen, UA: 0.2 mg/dL (ref 0.0–1.0)
pH: 6 (ref 5.0–8.0)

## 2013-06-24 LAB — POCT PREGNANCY, URINE: Preg Test, Ur: NEGATIVE

## 2013-06-24 MED ORDER — OXYCODONE-ACETAMINOPHEN 5-325 MG PO TABS: 1.0000 | ORAL_TABLET | Freq: Four times a day (QID) | ORAL | Status: DC | PRN

## 2013-06-24 MED ORDER — OXYCODONE HCL 5 MG PO TABS
5.0000 mg | ORAL_TABLET | Freq: Once | ORAL | Status: AC
  Administered 2013-06-24: 5 mg via ORAL
  Filled 2013-06-24: qty 1

## 2013-06-24 NOTE — MAU Provider Note (Signed)
History     CSN: [id number]  Arrival date and time: 06/24/13 1724   None     No chief complaint on file.  HPI Comments: Pt is a 32yo G0 arrived via EMS for abdominal pain. S/p laparotomy w r ovarian cyst removal on 12-2. Pt also seen here in MAU on 12-9 for post-op pain. Pain was seen by Dr Su Hilt in the office on 12-12. Pt denies any nausea/vomiting. Last BM today. Voiding well. Last took hydrocodone and motrin about 4pm.      Past Medical History  Diagnosis Date  . Bipolar 1 disorder   . Migraine   . Mental disorder   . Anxiety   . Interstitial cystitis   . Galactorrhea   . Chronic back pain   . Seasonal allergies   . Sinusitis   . IBS (irritable bowel syndrome)   . IC (interstitial cystitis)   . Insomnia   . GERD (gastroesophageal reflux disease)     Past Surgical History  Procedure Laterality Date  . Tubal ligation    . Bunionectomy    . Interstim implant placement    . Bowel resection  1998    prolapsed bowel  . Foot surgery      bunionectomy bil. pins put in and taken out  . Bladder surgery      distenstion numerous times  . Laparoscopy Right 06/13/2013    Procedure: LAPAROSCOPY OPERATIVE WITH OVARIAN CYSTECTOMY convert to open 1329;  Surgeon: Purcell Nails, MD;  Location: [facility];  Service: Gynecology;  Laterality: Right;  . Laparotomy Right 06/13/2013    Procedure: EXPLORATORY LAPAROTOMY, ovarian cyst;  Surgeon: Purcell Nails, MD;  Location: [facility];  Service: Gynecology;  Laterality: Right;  . Intrauterine device insertion  2 yrs ago    Mirena    Family History  Problem Relation Age of Onset  . Hypertension Mother   . Esophageal cancer Maternal Grandmother   . Hypertension Maternal Grandmother   . Heart disease Maternal Grandmother   . Stomach cancer Maternal Grandmother   . Diabetes Maternal Grandfather   . Stroke Maternal Grandfather   . Depression Maternal Grandfather   . Anxiety disorder Maternal Grandfather   . Hypertension Maternal  Grandfather   . Heart disease Maternal Grandfather   . Heart disease Other   . Diabetes Father   . Heart disease Father   . Stroke Father     History  Substance Use Topics  . Smoking status: Former Smoker -- 2.5 years    Types: Cigars    Quit date: 06/06/2001  . Smokeless tobacco: Never Used  . Alcohol Use: Yes     Comment: last drink a few days ago    Allergies:  Allergies  Allergen Reactions  . Augmentin [Amoxicillin-Pot Clavulanate] Nausea And Vomiting  . Ciprofloxacin Nausea Only  . Erythromycin Nausea And Vomiting    severe  . Gabapentin Other (See Comments)    SI INTENTIONS  . Morphine And Related Itching  . Other Other (See Comments)    Medication:Urised Reaction:"hurts bladder"  . Prozac [Fluoxetine Hcl]     SI  . Pyridium [Phenazopyridine Hcl]     Increased bladder pain    Prescriptions prior to admission  Medication Sig Dispense Refill  . buPROPion (WELLBUTRIN) 75 MG tablet 75 mg every morning.      . diclofenac sodium (VOLTAREN) 1 % GEL Apply 4 g topically 3 (three) times daily as needed (pain).      Marland Kitchen doxazosin (CARDURA)  4 MG tablet Take 4 mg by mouth at bedtime as needed and may repeat dose one time if needed.      Marland Kitchen HYDROcodone-acetaminophen (NORCO/VICODIN) 5-325 MG per tablet Take 1 tablet by mouth every 6 (six) hours as needed for moderate pain.      Marland Kitchen ibuprofen (ADVIL,MOTRIN) 200 MG tablet Take 200 mg by mouth every 6 (six) hours as needed for mild pain.      Marland Kitchen ibuprofen (ADVIL,MOTRIN) 600 MG tablet Take 600 mg by mouth every 6 (six) hours as needed for headache, mild pain or moderate pain.      Marland Kitchen levocetirizine (XYZAL) 5 MG tablet Take 5 mg by mouth every evening.      . metoCLOPramide (REGLAN) 10 MG tablet Take 1 tablet (10 mg total) by mouth every 6 (six) hours as needed for nausea.  30 tablet  0  . mupirocin cream (BACTROBAN) 2 % Apply 1 application topically 2 (two) times daily.      . ondansetron (ZOFRAN) 8 MG tablet Take 0.5 tablets (4 mg total)  by mouth every 8 (eight) hours as needed for nausea. For nausea  20 tablet    . oxyCODONE-acetaminophen (PERCOCET/ROXICET) 5-325 MG per tablet Take 1-2 tablets by mouth every 6 (six) hours as needed for moderate pain.  30 tablet  0  . oxymetazoline (AFRIN) 0.05 % nasal spray Place 1 spray into both nostrils 2 (two) times daily as needed for congestion.      . SUMAtriptan (IMITREX) 100 MG tablet Take 1 tablet (100 mg total) by mouth 2 (two) times daily as needed for migraine.  9 tablet  5  . traZODone (DESYREL) 50 MG tablet Take 150 mg by mouth at bedtime and may repeat dose one time if needed. For depression/sleep      . levonorgestrel (MIRENA) 20 MCG/24HR IUD 1 each by Intrauterine route once.      . traMADol (ULTRAM) 50 MG tablet Take 50 mg by mouth every 8 (eight) hours as needed for pain.         Review of Systems  Constitutional: Negative for fever.  Gastrointestinal: Positive for abdominal pain. Negative for nausea, vomiting, diarrhea and constipation.  Genitourinary: Negative for dysuria.  All other systems reviewed and are negative.   Physical Exam   Blood pressure 102/65, pulse 71, temperature 98.1 F (36.7 C), temperature source Oral, resp. rate 16, last menstrual period 06/01/2013.  Physical Exam  Nursing note and vitals reviewed. Constitutional: She is oriented to person, place, and time. She appears well-developed and well-nourished. No distress.  HENT:  Head: Normocephalic.  Eyes: Pupils are equal, round, and reactive to light.  Neck: Normal range of motion.  Cardiovascular: Normal rate, regular rhythm and normal heart sounds.   Respiratory: Effort normal and breath sounds normal.  GI: Soft. Bowel sounds are normal. She exhibits no distension and no mass. There is no tenderness. There is no rebound and no guarding.  Genitourinary:  Pelvic deferred   Musculoskeletal: Normal range of motion.  Neurological: She is alert and oriented to person, place, and time. She has  normal reflexes.  Skin: Skin is warm and dry.  Incisions appear to be healing well, well approximated and no erythema noted  Has gauze pad over umbilicus and across suprapubic incision   Psychiatric: She has a normal mood and affect. Her behavior is normal.    MAU Course  Procedures    Assessment and Plan  S/p laparotomy on 12-2 post-op pain  Check cbc  and cmet D/w Dr Estanislado Pandy and she will report to Dr Su Hilt,  Will wait for further orders  Apolonio Cutting M 06/24/2013, 6:59 PM   Addendum: at 2011  S: rcv'd PO oxycodone 1 tab with report of decreased pain  O: VSS Results for orders placed during the hospital encounter of 06/24/13 (from the past 24 hour(s))  URINALYSIS, ROUTINE W REFLEX MICROSCOPIC     Status: Abnormal   Collection Time    06/24/13  5:30 PM      Result Value Range   Color, Urine YELLOW  YELLOW   APPearance CLEAR  CLEAR   Specific Gravity, Urine >1.030 (*) 1.005 - 1.030   pH 6.0  5.0 - 8.0   Glucose, UA NEGATIVE  NEGATIVE mg/dL   Hgb urine dipstick NEGATIVE  NEGATIVE   Bilirubin Urine NEGATIVE  NEGATIVE   Ketones, ur 15 (*) NEGATIVE mg/dL   Protein, ur NEGATIVE  NEGATIVE mg/dL   Urobilinogen, UA 0.2  0.0 - 1.0 mg/dL   Nitrite NEGATIVE  NEGATIVE   Leukocytes, UA NEGATIVE  NEGATIVE  CBC WITH DIFFERENTIAL     Status: Abnormal   Collection Time    06/24/13  6:25 PM      Result Value Range   WBC 7.8  4.0 - 10.5 K/uL   RBC 3.59 (*) 3.87 - 5.11 MIL/uL   Hemoglobin 10.9 (*) 12.0 - 15.0 g/dL   HCT 54.0 (*) 98.1 - 19.1 %   MCV 89.7  78.0 - 100.0 fL   MCH 30.4  26.0 - 34.0 pg   MCHC 33.9  30.0 - 36.0 g/dL   RDW 47.8  29.5 - 62.1 %   Platelets 341  150 - 400 K/uL   Neutrophils Relative % 68  43 - 77 %   Neutro Abs 5.3  1.7 - 7.7 K/uL   Lymphocytes Relative 20  12 - 46 %   Lymphs Abs 1.6  0.7 - 4.0 K/uL   Monocytes Relative 7  3 - 12 %   Monocytes Absolute 0.5  0.1 - 1.0 K/uL   Eosinophils Relative 5  0 - 5 %   Eosinophils Absolute 0.4  0.0 - 0.7 K/uL    Basophils Relative 1  0 - 1 %   Basophils Absolute 0.0  0.0 - 0.1 K/uL  COMPREHENSIVE METABOLIC PANEL     Status: Abnormal   Collection Time    06/24/13  6:25 PM      Result Value Range   Sodium 135  135 - 145 mEq/L   Potassium 4.4  3.5 - 5.1 mEq/L   Chloride 101  96 - 112 mEq/L   CO2 27  19 - 32 mEq/L   Glucose, Bld 97  70 - 99 mg/dL   BUN 10  6 - 23 mg/dL   Creatinine, Ser 3.08  0.50 - 1.10 mg/dL   Calcium 9.1  8.4 - 65.7 mg/dL   Total Protein 6.2  6.0 - 8.3 g/dL   Albumin 3.7  3.5 - 5.2 g/dL   AST 14  0 - 37 U/L   ALT 8  0 - 35 U/L   Alkaline Phosphatase 64  39 - 117 U/L   Total Bilirubin <0.1 (*) 0.3 - 1.2 mg/dL   GFR calc non Af Amer >90  >90 mL/min   GFR calc Af Amer >90  >90 mL/min     A: s/p laparotomy on 12-2  P: d/w Dr Su Hilt  Per instructions from Dr Su Hilt, Will d/c home w  Rx percocet 5/325mg  #20 and no refills,  rv'd w pt percocet is to last until pt's f/u appt w Dr Su Hilt on Jan 6th.  Pt to call if pain persists, fever or incisions not healing dc'd home in stable condition   S.Nivea Wojdyla, CNM

## 2013-06-24 NOTE — MAU Note (Signed)
Pt arrived via EMS with c/o low abd pain unrelieved by hydrocodone.  Had ovarian cyct removed on  Dec 3rd. EMT  stated pt was doubled over in bed upon arrival stating pain was 10/10.  Once in truck, pt was laughing with EMT and never mentioned her pain.  Stated she had to have a BM when she arrived.

## 2013-08-16 ENCOUNTER — Other Ambulatory Visit: Payer: Self-pay

## 2013-09-26 ENCOUNTER — Emergency Department (HOSPITAL_COMMUNITY)
Admission: EM | Admit: 2013-09-26 | Discharge: 2013-09-26 | Disposition: A | Discharge status: Home / Self Care | Payer: Medicare Other | Source: Home / Self Care | Attending: Family Medicine | Admitting: Family Medicine

## 2013-09-26 ENCOUNTER — Emergency Department (HOSPITAL_COMMUNITY)
Admission: EM | Admit: 2013-09-26 | Discharge: 2013-09-26 | Discharge status: Against Medical Advice | Payer: Medicare Other | Attending: Emergency Medicine | Admitting: Emergency Medicine

## 2013-09-26 ENCOUNTER — Encounter (HOSPITAL_COMMUNITY): Payer: Self-pay | Admitting: Emergency Medicine

## 2013-09-26 DIAGNOSIS — F489 Nonpsychotic mental disorder, unspecified: Secondary | ICD-10-CM | POA: Insufficient documentation

## 2013-09-26 DIAGNOSIS — M549 Dorsalgia, unspecified: Secondary | ICD-10-CM | POA: Insufficient documentation

## 2013-09-26 DIAGNOSIS — Z975 Presence of (intrauterine) contraceptive device: Secondary | ICD-10-CM | POA: Insufficient documentation

## 2013-09-26 DIAGNOSIS — Z87448 Personal history of other diseases of urinary system: Secondary | ICD-10-CM | POA: Insufficient documentation

## 2013-09-26 DIAGNOSIS — Z9889 Other specified postprocedural states: Secondary | ICD-10-CM | POA: Insufficient documentation

## 2013-09-26 DIAGNOSIS — G43909 Migraine, unspecified, not intractable, without status migrainosus: Secondary | ICD-10-CM | POA: Insufficient documentation

## 2013-09-26 DIAGNOSIS — K219 Gastro-esophageal reflux disease without esophagitis: Secondary | ICD-10-CM | POA: Insufficient documentation

## 2013-09-26 DIAGNOSIS — F319 Bipolar disorder, unspecified: Secondary | ICD-10-CM | POA: Insufficient documentation

## 2013-09-26 DIAGNOSIS — Z87891 Personal history of nicotine dependence: Secondary | ICD-10-CM | POA: Insufficient documentation

## 2013-09-26 DIAGNOSIS — F411 Generalized anxiety disorder: Secondary | ICD-10-CM | POA: Insufficient documentation

## 2013-09-26 DIAGNOSIS — Z8709 Personal history of other diseases of the respiratory system: Secondary | ICD-10-CM | POA: Insufficient documentation

## 2013-09-26 DIAGNOSIS — Z79899 Other long term (current) drug therapy: Secondary | ICD-10-CM | POA: Insufficient documentation

## 2013-09-26 DIAGNOSIS — Z9851 Tubal ligation status: Secondary | ICD-10-CM | POA: Insufficient documentation

## 2013-09-26 DIAGNOSIS — Z8742 Personal history of other diseases of the female genital tract: Secondary | ICD-10-CM | POA: Insufficient documentation

## 2013-09-26 DIAGNOSIS — G4700 Insomnia, unspecified: Secondary | ICD-10-CM | POA: Insufficient documentation

## 2013-09-26 DIAGNOSIS — G8929 Other chronic pain: Secondary | ICD-10-CM

## 2013-09-26 DIAGNOSIS — K5900 Constipation, unspecified: Secondary | ICD-10-CM | POA: Insufficient documentation

## 2013-09-26 DIAGNOSIS — R112 Nausea with vomiting, unspecified: Secondary | ICD-10-CM | POA: Insufficient documentation

## 2013-09-26 DIAGNOSIS — Z3202 Encounter for pregnancy test, result negative: Secondary | ICD-10-CM | POA: Insufficient documentation

## 2013-09-26 DIAGNOSIS — N946 Dysmenorrhea, unspecified: Secondary | ICD-10-CM | POA: Insufficient documentation

## 2013-09-26 LAB — URINALYSIS, ROUTINE W REFLEX MICROSCOPIC
Bilirubin Urine: NEGATIVE
Glucose, UA: NEGATIVE mg/dL
Ketones, ur: 15 mg/dL — AB
Nitrite: NEGATIVE
Protein, ur: NEGATIVE mg/dL
Specific Gravity, Urine: 1.013 (ref 1.005–1.030)
Urobilinogen, UA: 0.2 mg/dL (ref 0.0–1.0)
pH: 6 (ref 5.0–8.0)

## 2013-09-26 LAB — POC URINE PREG, ED: Preg Test, Ur: NEGATIVE

## 2013-09-26 LAB — CBC
HCT: 39.5 % (ref 36.0–46.0)
Hemoglobin: 13.7 g/dL (ref 12.0–15.0)
MCH: 31 pg (ref 26.0–34.0)
MCHC: 34.7 g/dL (ref 30.0–36.0)
MCV: 89.4 fL (ref 78.0–100.0)
Platelets: 224 10*3/uL (ref 150–400)
RBC: 4.42 MIL/uL (ref 3.87–5.11)
RDW: 12.7 % (ref 11.5–15.5)
WBC: 5 10*3/uL (ref 4.0–10.5)

## 2013-09-26 LAB — URINE MICROSCOPIC-ADD ON

## 2013-09-26 LAB — BASIC METABOLIC PANEL WITH GFR
BUN: 7 mg/dL (ref 6–23)
CO2: 22 meq/L (ref 19–32)
Calcium: 9.4 mg/dL (ref 8.4–10.5)
Chloride: 100 meq/L (ref 96–112)
Creatinine, Ser: 0.77 mg/dL (ref 0.50–1.10)
GFR calc Af Amer: >90 mL/min
GFR calc non Af Amer: >90 mL/min
Glucose, Bld: 111 mg/dL — ABNORMAL HIGH (ref 70–99)
Potassium: 3.9 meq/L (ref 3.7–5.3)
Sodium: 137 meq/L (ref 137–147)

## 2013-09-26 LAB — LIPASE, BLOOD: Lipase: 32 U/L (ref 11–59)

## 2013-09-26 MED ORDER — HYDROCODONE-ACETAMINOPHEN 5-325 MG PO TABS
1.0000 | ORAL_TABLET | Freq: Once | ORAL | Status: AC
  Administered 2013-09-26: 1 via ORAL
  Filled 2013-09-26: qty 1

## 2013-09-26 NOTE — ED Provider Notes (Signed)
CSN: [id number]     Arrival date & time 09/26/13  [id number] History   First MD Initiated Contact with Patient 09/26/13 1105     Chief Complaint  Patient presents with  . Abdominal Cramping  . Pain     (Consider location/radiation/quality/duration/timing/severity/associated sxs/prior Treatment) HPI Comments: Patient presents emergency department with chief complaint of chronic pain. She states that she has run out of her pain medications, and is supposed to see her chronic pain specialist tomorrow. She is requesting a dose of hydrocodone. She states that the majority of her pain is in her abdomen, and is related to her menstrual cycle, which she started today. She denies any fevers or chills, but does report intermittent nausea, and vomiting. She states that the only thing she wants today is a pain pill. Says her pain is 10 out of 10.  The history is provided by the patient. No language interpreter was used.    Past Medical History  Diagnosis Date  . Bipolar 1 disorder   . Migraine   . Mental disorder   . Anxiety   . Interstitial cystitis   . Galactorrhea   . Chronic back pain   . Seasonal allergies   . Sinusitis   . IBS (irritable bowel syndrome)   . IC (interstitial cystitis)   . Insomnia   . GERD (gastroesophageal reflux disease)    Past Surgical History  Procedure Laterality Date  . Tubal ligation    . Bunionectomy    . Interstim implant placement    . Bowel resection  1998    prolapsed bowel  . Foot surgery      bunionectomy bil. pins put in and taken out  . Bladder surgery      distenstion numerous times  . Laparoscopy Right 06/13/2013    Procedure: LAPAROSCOPY OPERATIVE WITH OVARIAN CYSTECTOMY convert to open 1329;  Surgeon: Purcell NailsAngela Y Roberts, MD;  Location: [facility];  Service: Gynecology;  Laterality: Right;  . Laparotomy Right 06/13/2013    Procedure: EXPLORATORY LAPAROTOMY, ovarian cyst;  Surgeon: Purcell NailsAngela Y Roberts, MD;  Location: [facility];  Service: Gynecology;  Laterality:  Right;  . Intrauterine device insertion  2 yrs ago    Mirena   Family History  Problem Relation Age of Onset  . Hypertension Mother   . Esophageal cancer Maternal Grandmother   . Hypertension Maternal Grandmother   . Heart disease Maternal Grandmother   . Stomach cancer Maternal Grandmother   . Diabetes Maternal Grandfather   . Stroke Maternal Grandfather   . Depression Maternal Grandfather   . Anxiety disorder Maternal Grandfather   . Hypertension Maternal Grandfather   . Heart disease Maternal Grandfather   . Heart disease Other   . Diabetes Father   . Heart disease Father   . Stroke Father    History  Substance Use Topics  . Smoking status: Former Smoker -- 2.5 years    Types: Cigars    Quit date: 06/06/2001  . Smokeless tobacco: Never Used  . Alcohol Use: Yes     Comment: last drink a few days ago   OB History   Grav Para Term Preterm Abortions TAB SAB Ect Mult Living   0              Review of Systems  Constitutional: Negative for fever and chills.  Respiratory: Negative for shortness of breath.   Cardiovascular: Negative for chest pain.  Gastrointestinal: Positive for nausea, vomiting, abdominal pain and constipation. Negative for diarrhea.  Genitourinary: Negative for dysuria.      Allergies  Augmentin; Ciprofloxacin; Erythromycin; Gabapentin; Morphine and related; Other; Prozac; and Pyridium  Home Medications   Current Outpatient Rx  Name  Route  Sig  Dispense  Refill  . buPROPion (WELLBUTRIN) 75 MG tablet      75 mg every morning.         . diclofenac sodium (VOLTAREN) 1 % GEL   Topical   Apply 4 g topically 3 (three) times daily as needed (pain).         Marland Kitchen doxazosin (CARDURA) 4 MG tablet   Oral   Take 4 mg by mouth at bedtime as needed and may repeat dose one time if needed.         Marland Kitchen ibuprofen (ADVIL,MOTRIN) 200 MG tablet   Oral   Take 200 mg by mouth every 6 (six) hours as needed for mild pain.         Marland Kitchen ibuprofen (ADVIL,MOTRIN)  600 MG tablet   Oral   Take 600 mg by mouth every 6 (six) hours as needed for headache, mild pain or moderate pain.         Marland Kitchen levocetirizine (XYZAL) 5 MG tablet   Oral   Take 5 mg by mouth every evening.         Marland Kitchen levonorgestrel (MIRENA) 20 MCG/24HR IUD   Intrauterine   1 each by Intrauterine route once.         . metoCLOPramide (REGLAN) 10 MG tablet   Oral   Take 1 tablet (10 mg total) by mouth every 6 (six) hours as needed for nausea.   30 tablet   0   . mupirocin cream (BACTROBAN) 2 %   Topical   Apply 1 application topically 2 (two) times daily.         . ondansetron (ZOFRAN) 8 MG tablet   Oral   Take 0.5 tablets (4 mg total) by mouth every 8 (eight) hours as needed for nausea. For nausea   20 tablet      . oxyCODONE-acetaminophen (PERCOCET/ROXICET) 5-325 MG per tablet   Oral   Take 1-2 tablets by mouth every 6 (six) hours as needed for moderate pain.   30 tablet   0   . oxyCODONE-acetaminophen (ROXICET) 5-325 MG per tablet   Oral   Take 1 tablet by mouth every 6 (six) hours as needed for severe pain.   20 tablet   0   . oxymetazoline (AFRIN) 0.05 % nasal spray   Each Nare   Place 1 spray into both nostrils 2 (two) times daily as needed for congestion.         . SUMAtriptan (IMITREX) 100 MG tablet   Oral   Take 1 tablet (100 mg total) by mouth 2 (two) times daily as needed for migraine.   9 tablet   5   . traMADol (ULTRAM) 50 MG tablet   Oral   Take 50 mg by mouth every 8 (eight) hours as needed for pain.          . traZODone (DESYREL) 50 MG tablet   Oral   Take 150 mg by mouth at bedtime and may repeat dose one time if needed. For depression/sleep          BP 116/70  Pulse 101  Temp(Src) 97.4 F (36.3 C) (Oral)  Resp 18  SpO2 99%  LMP 08/24/2013 Physical Exam  Nursing note and vitals reviewed. Constitutional: She is oriented to person,  place, and time. She appears well-developed and well-nourished.  HENT:  Head: Normocephalic  and atraumatic.  Eyes: Conjunctivae and EOM are normal. Pupils are equal, round, and reactive to light.  Neck: Normal range of motion. Neck supple.  Cardiovascular: Normal rate and regular rhythm.  Exam reveals no gallop and no friction rub.   No murmur heard. Pulmonary/Chest: Effort normal and breath sounds normal. No respiratory distress. She has no wheezes. She has no rales. She exhibits no tenderness.  Abdominal: Soft. Bowel sounds are normal. She exhibits no distension and no mass. There is no tenderness. There is no rebound and no guarding.  No focal abdominal tenderness, no RLQ tenderness or pain at McBurney's point, no RUQ tenderness or Murphy's sign, no left-sided abdominal tenderness, no fluid wave, or signs of peritonitis   Musculoskeletal: Normal range of motion. She exhibits no edema and no tenderness.  Neurological: She is alert and oriented to person, place, and time.  Skin: Skin is warm and dry.  Psychiatric: She has a normal mood and affect. Her behavior is normal. Judgment and thought content normal.    ED Course  Procedures (including critical care time) Results for orders placed during the hospital encounter of 09/26/13  CBC      Result Value Ref Range   WBC 5.0  4.0 - 10.5 K/uL   RBC 4.42  3.87 - 5.11 MIL/uL   Hemoglobin 13.7  12.0 - 15.0 g/dL   HCT 16.1  09.6 - 04.5 %   MCV 89.4  78.0 - 100.0 fL   MCH 31.0  26.0 - 34.0 pg   MCHC 34.7  30.0 - 36.0 g/dL   RDW 40.9  81.1 - 91.4 %   Platelets 224  150 - 400 K/uL  BASIC METABOLIC PANEL      Result Value Ref Range   Sodium 137  137 - 147 mEq/L   Potassium 3.9  3.7 - 5.3 mEq/L   Chloride 100  96 - 112 mEq/L   CO2 22  19 - 32 mEq/L   Glucose, Bld 111 (*) 70 - 99 mg/dL   BUN 7  6 - 23 mg/dL   Creatinine, Ser 7.82  0.50 - 1.10 mg/dL   Calcium 9.4  8.4 - 95.6 mg/dL   GFR calc non Af Amer >90  >90 mL/min   GFR calc Af Amer >90  >90 mL/min  LIPASE, BLOOD      Result Value Ref Range   Lipase 32  11 - 59 U/L  POC  URINE PREG, ED      Result Value Ref Range   Preg Test, Ur NEGATIVE  NEGATIVE   No results found.    EKG Interpretation None      MDM   Final diagnoses:  Chronic pain    Patient with chronic pain.  Asks for hydrocodone.  She reports diffuse lower abdominal pain, however, exam is benign.    1:23 PM Went to reassess patient after having given 1 dose of hydrocodone, and the patient had eloped from the ED.  Patient discharged as AMA because she left prior to completing evaluation.    Roxy Horseman, PA-C 09/26/13 1325

## 2013-09-26 NOTE — ED Notes (Signed)
PT left after pain med given

## 2013-09-26 NOTE — ED Notes (Signed)
PT instructed to collect urine. Pt attempted to collect urine but not able to at this time.

## 2013-09-26 NOTE — Discharge Instructions (Signed)
Chronic Pain Discharge Instructions  °Emergency care providers appreciate that many patients coming to us are in severe pain and we wish to address their pain in the safest, most responsible manner.  It is important to recognize however, that the proper treatment of chronic pain differs from that of the pain of injuries and acute illnesses.  Our goal is to provide quality, safe, personalized care and we thank you for giving us the opportunity to serve you. °The use of narcotics and related agents for chronic pain syndromes may lead to additional physical and psychological problems.  Nearly as many people die from prescription narcotics each year as die from car crashes.  Additionally, this risk is increased if such prescriptions are obtained from a variety of sources.  Therefore, only your primary care physician or a pain management specialist is able to safely treat such syndromes with narcotic medications long-term.   ° °Documentation revealing such prescriptions have been sought from multiple sources may prohibit us from providing a refill or different narcotic medication.  Your name may be checked first through the [place] Controlled Substances Reporting System.  This database is a record of controlled substance medication prescriptions that the patient has received.  This has been established by [employer] in an effort to eliminate the dangerous, and often life threatening, practice of obtaining multiple prescriptions from different medical providers.  ° °If you have a chronic pain syndrome (i.e. chronic headaches, recurrent back or neck pain, dental pain, abdominal or pelvis pain without a specific diagnosis, or neuropathic pain such as fibromyalgia) or recurrent visits for the same condition without an acute diagnosis, you may be treated with non-narcotics and other non-addictive medicines.  Allergic reactions or negative side effects that may be reported by a patient to such medications will not  typically lead to the use of a narcotic analgesic or other controlled substance as an alternative. °  °Patients managing chronic pain with a personal physician should have provisions in place for breakthrough pain.  If you are in crisis, you should call your physician.  If your physician directs you to the emergency department, please have the doctor call and speak to our attending physician concerning your care. °  °When patients come to the Emergency Department (ED) with acute medical conditions in which the Emergency Department physician feels appropriate to prescribe narcotic or sedating pain medication, the physician will prescribe these in very limited quantities.  The amount of these medications will last only until you can see your primary care physician in his/her office.  Any patient who returns to the ED seeking refills should expect only non-narcotic pain medications.  ° °In the event of an acute medical condition exists and the emergency physician feels it is necessary that the patient be given a narcotic or sedating medication -  a responsible adult driver should be present in the room prior to the medication being given by the nurse. °  °Prescriptions for narcotic or sedating medications that have been lost, stolen or expired will not be refilled in the Emergency Department.   ° °Patients who have chronic pain may receive non-narcotic prescriptions until seen by their primary care physician.  It is every patient’s personal responsibility to maintain active prescriptions with his or her primary care physician or specialist. °

## 2013-09-26 NOTE — ED Notes (Addendum)
Pt reports 10/10 lower abdominal pain/cramping x 1 week with N/V. States this is normal pain prior to period. Denies taking anything for pain.

## 2013-09-26 NOTE — ED Provider Notes (Signed)
Medical screening examination/treatment/procedure(s) were performed by non-physician practitioner and as supervising physician I was immediately available for consultation/collaboration.  Seaver Machia L Ardyn Forge, MD 09/26/13 1639 

## 2013-09-26 NOTE — ED Notes (Signed)
PT collected less than 10 ML of urine for sample. Pt stated " they only need to put it on paper." PT instructed that more than 10 ml is needed for testing.

## 2013-10-05 ENCOUNTER — Telehealth: Payer: Self-pay | Admitting: Neurology

## 2013-10-05 MED ORDER — RIZATRIPTAN BENZOATE 10 MG PO TBDP: 10.0000 mg | ORAL_TABLET | ORAL | Status: DC | PRN

## 2013-10-05 NOTE — Telephone Encounter (Signed)
Pt's mother called.  She stated that Pt is having more headaches.  She may have a migraine for 3 days and it stop for 2 then have another one for 3 days.  She states that the Pt is not on any particular headache medicine and the only one she is on is supposed to be used every so often.  Pt has an appointment scheduled for 4-23 @ 3:15 but doesn't think she can wait that long.  Please call to advise.  Thank you.

## 2013-10-05 NOTE — Telephone Encounter (Signed)
Called and talked to Mom. Will change  Imitrex to Maxalt MLT. Faxed to pharmacyPt has had some nausea. Increase fluids. Call back next week if not effective. She is agreeable

## 2013-10-05 NOTE — Telephone Encounter (Signed)
Called pt's mother Nena Jordanerrie and she stated that the pt is having more headaches and lasting for 3 days at a time. Mother states that the medication that the pt has for headaches is not working, which is sumatriptan. Pt has an appt on 11/01/13 and would like to come in sooner concerning this problem. Pt can be reached at [phone]. Please advise

## 2013-10-16 HISTORY — PX: ABDOMINAL HYSTERECTOMY: SHX81

## 2013-10-29 ENCOUNTER — Emergency Department (INDEPENDENT_AMBULATORY_CARE_PROVIDER_SITE_OTHER)
Admission: EM | Admit: 2013-10-29 | Discharge: 2013-10-29 | Disposition: A | Discharge status: Home / Self Care | Payer: Medicare Other | Source: Home / Self Care | Attending: Family Medicine | Admitting: Family Medicine

## 2013-10-29 ENCOUNTER — Encounter (HOSPITAL_COMMUNITY): Payer: Self-pay | Admitting: Emergency Medicine

## 2013-10-29 DIAGNOSIS — N390 Urinary tract infection, site not specified: Secondary | ICD-10-CM

## 2013-10-29 LAB — POCT URINALYSIS DIP (DEVICE)
Glucose, UA: 250 mg/dL — AB
Hgb urine dipstick: NEGATIVE
Nitrite: POSITIVE — AB
Protein, ur: 100 mg/dL — AB
Specific Gravity, Urine: 1.01 (ref 1.005–1.030)
Urobilinogen, UA: >=8 mg/dL (ref 0.0–1.0)
pH: 7 (ref 5.0–8.0)

## 2013-10-29 LAB — POCT PREGNANCY, URINE: Preg Test, Ur: NEGATIVE

## 2013-10-29 MED ORDER — CEPHALEXIN 500 MG PO CAPS: 500.0000 mg | ORAL_CAPSULE | Freq: Four times a day (QID) | ORAL | Status: DC

## 2013-10-29 MED ORDER — NITROFURANTOIN MONOHYD MACRO 100 MG PO CAPS: 100.0000 mg | ORAL_CAPSULE | Freq: Two times a day (BID) | ORAL | Status: DC

## 2013-10-29 NOTE — ED Notes (Signed)
Pt  Reports  Symptoms  Of  Urinary  Frequency         As  Well      Back pain  And  Body  Aches       She  Has  Been taking  otc  meds  For  Bladder  Problems                Symptoms  X  2  Days

## 2013-10-29 NOTE — ED Provider Notes (Signed)
CSN: [id number]     Arrival date & time 10/29/13  1054 History   First MD Initiated Contact with Patient 10/29/13 1132     Chief Complaint  Patient presents with  . Urinary Frequency   (Consider location/radiation/quality/duration/timing/severity/associated sxs/prior Treatment) HPI Comments: [facility] (surgery 2 weeks ago).   Patient is a 33 y.o. female presenting with dysuria. The history is provided by the patient.  Dysuria Pain quality:  Burning Pain severity:  Moderate Onset quality:  Gradual Duration:  2 days Timing:  Constant Progression:  Worsening Chronicity:  Recurrent Recent urinary tract infections: yes (reports chronic hx of UTIs and IC)   Ineffective treatments: States she already takes Macrobid daily as prescribed by her urologist. Urinary symptoms: frequent urination   Urinary symptoms: no hematuria   Associated symptoms: nausea and vomiting   Associated symptoms: no abdominal pain, no fever, no flank pain, no genital lesions and no vaginal discharge   Associated symptoms comment:  +lower back discomfort.   Past Medical History  Diagnosis Date  . Bipolar 1 disorder   . Migraine   . Mental disorder   . Anxiety   . Interstitial cystitis   . Galactorrhea   . Chronic back pain   . Seasonal allergies   . Sinusitis   . IBS (irritable bowel syndrome)   . IC (interstitial cystitis)   . Insomnia   . GERD (gastroesophageal reflux disease)    Past Surgical History  Procedure Laterality Date  . Tubal ligation    . Bunionectomy    . Interstim implant placement    . Bowel resection  1998    prolapsed bowel  . Foot surgery      bunionectomy bil. pins put in and taken out  . Bladder surgery      distenstion numerous times  . Laparoscopy Right 06/13/2013    Procedure: LAPAROSCOPY OPERATIVE WITH OVARIAN CYSTECTOMY convert to open 1329;  Surgeon: Purcell NailsAngela Y Roberts, MD;  Location: [facility];  Service: Gynecology;  Laterality:  Right;  . Laparotomy Right 06/13/2013    Procedure: EXPLORATORY LAPAROTOMY, ovarian cyst;  Surgeon: Purcell NailsAngela Y Roberts, MD;  Location: [facility];  Service: Gynecology;  Laterality: Right;  . Intrauterine device insertion  2 yrs ago    Mirena   Family History  Problem Relation Age of Onset  . Hypertension Mother   . Esophageal cancer Maternal Grandmother   . Hypertension Maternal Grandmother   . Heart disease Maternal Grandmother   . Stomach cancer Maternal Grandmother   . Diabetes Maternal Grandfather   . Stroke Maternal Grandfather   . Depression Maternal Grandfather   . Anxiety disorder Maternal Grandfather   . Hypertension Maternal Grandfather   . Heart disease Maternal Grandfather   . Heart disease Other   . Diabetes Father   . Heart disease Father   . Stroke Father    History  Substance Use Topics  . Smoking status: Former Smoker -- 2.5 years    Types: Cigars    Quit date: 06/06/2001  . Smokeless tobacco: Never Used  . Alcohol Use: Yes     Comment: last drink a few days ago   OB History   Grav Para Term Preterm Abortions TAB SAB Ect Mult Living   0              Review of Systems  Constitutional: Negative for fever.  HENT: Negative.   Respiratory: Negative.   Cardiovascular: Negative.   Gastrointestinal: Positive for  nausea, vomiting and diarrhea. Negative for abdominal pain, constipation and blood in stool.  Endocrine: Negative for polydipsia, polyphagia and polyuria.  Genitourinary: Positive for dysuria, urgency and frequency. Negative for hematuria, flank pain, vaginal discharge, genital sores and pelvic pain.  Skin: Negative.     Allergies  Augmentin; Ciprofloxacin; Erythromycin; Gabapentin; Morphine and related; Other; Prozac; and Pyridium  Home Medications   Prior to Admission medications   Medication Sig Start Date End Date Taking? Authorizing Provider  buPROPion (WELLBUTRIN) 75 MG tablet 75 mg every morning. 04/12/13   Historical Provider, MD  diclofenac  sodium (VOLTAREN) 1 % GEL Apply 4 g topically 3 (three) times daily as needed (pain).    Historical Provider, MD  doxazosin (CARDURA) 4 MG tablet Take 4 mg by mouth at bedtime as needed and may repeat dose one time if needed (for high blood pressure).     Historical Provider, MD  ibuprofen (ADVIL,MOTRIN) 200 MG tablet Take 200 mg by mouth every 6 (six) hours as needed for mild pain.    Historical Provider, MD  ibuprofen (ADVIL,MOTRIN) 600 MG tablet Take 600 mg by mouth every 6 (six) hours as needed for headache, mild pain or moderate pain.    Historical Provider, MD  levocetirizine (XYZAL) 5 MG tablet Take 5 mg by mouth every evening.    Historical Provider, MD  levonorgestrel (MIRENA) 20 MCG/24HR IUD 1 each by Intrauterine route once.    Historical Provider, MD  mupirocin cream (BACTROBAN) 2 % Apply 1 application topically 2 (two) times daily.    Historical Provider, MD  oxyCODONE-acetaminophen (PERCOCET/ROXICET) 5-325 MG per tablet Take 1-2 tablets by mouth every 6 (six) hours as needed for moderate pain. 06/14/13   Purcell NailsAngela Y Roberts, MD  rizatriptan (MAXALT-MLT) 10 MG disintegrating tablet Take 1 tablet (10 mg total) by mouth as needed for migraine. May repeat in 2 hours if needed 10/05/13   Nilda RiggsNancy Carolyn Shilling, NP  traMADol (ULTRAM) 50 MG tablet Take 50 mg by mouth every 8 (eight) hours as needed for pain.  10/04/12   Historical Provider, MD  traZODone (DESYREL) 50 MG tablet Take 150 mg by mouth at bedtime and may repeat dose one time if needed. For depression/sleep 09/05/12   Sanjuana KavaAgnes I Nwoko, NP   BP 111/56  Pulse 74  Temp(Src) 97.8 F (36.6 C) (Oral)  Resp 14  SpO2 98%  LMP 09/27/2013 Physical Exam  Nursing note and vitals reviewed. Constitutional: She is oriented to person, place, and time. She appears well-developed. No distress.  HENT:  Head: Normocephalic and atraumatic.  Eyes: Conjunctivae are normal. No scleral icterus.  Cardiovascular: Normal rate, regular rhythm and normal heart  sounds.   Pulmonary/Chest: Effort normal and breath sounds normal.  Abdominal: Soft. Bowel sounds are normal. She exhibits no distension. There is no tenderness. There is no CVA tenderness.  Musculoskeletal: Normal range of motion.  Neurological: She is alert and oriented to person, place, and time.  Skin: Skin is warm and dry.  Psychiatric: She has a normal mood and affect. Her behavior is normal.    ED Course  Procedures (including critical care time) Labs Review Labs Reviewed  POCT URINALYSIS DIP (DEVICE) - Abnormal; Notable for the following:    Glucose, UA 250 (*)    Bilirubin Urine SMALL (*)    Ketones, ur TRACE (*)    Protein, ur 100 (*)    Nitrite POSITIVE (*)    Leukocytes, UA LARGE (*)    All other components within normal limits  URINE CULTURE  POCT PREGNANCY, URINE    Results for orders placed during the hospital encounter of 10/29/13  POCT PREGNANCY, URINE      Result Value Ref Range   Preg Test, Ur NEGATIVE  NEGATIVE  POCT URINALYSIS DIP (DEVICE)      Result Value Ref Range   Glucose, UA 250 (*) NEGATIVE mg/dL   Bilirubin Urine SMALL (*) NEGATIVE   Ketones, ur TRACE (*) NEGATIVE mg/dL   Specific Gravity, Urine 1.010  1.005 - 1.030   Hgb urine dipstick NEGATIVE  NEGATIVE   pH 7.0  5.0 - 8.0   Protein, ur 100 (*) NEGATIVE mg/dL   Urobilinogen, UA >=1.6  0.0 - 1.0 mg/dL   Nitrite POSITIVE (*) NEGATIVE   Leukocytes, UA LARGE (*) NEGATIVE   Imaging Review No results found.   MDM   1. UTI (lower urinary tract infection)   Urine sent for culture. Cephalexin as prescribed with follow up with urologist if symptoms do not improve.    Jess Barters [place], [name] 10/29/13 1308

## 2013-10-29 NOTE — Discharge Instructions (Signed)
Urinary Tract Infection  Urinary tract infections (UTIs) can develop anywhere along your urinary tract. Your urinary tract is your body's drainage system for removing wastes and extra water. Your urinary tract includes two kidneys, two ureters, a bladder, and a urethra. Your kidneys are a pair of bean-shaped organs. Each kidney is about the size of your fist. They are located below your ribs, one on each side of your spine.  CAUSES  Infections are caused by microbes, which are microscopic organisms, including fungi, viruses, and bacteria. These organisms are so small that they can only be seen through a microscope. Bacteria are the microbes that most commonly cause UTIs.  SYMPTOMS   Symptoms of UTIs may vary by age and gender of the patient and by the location of the infection. Symptoms in young women typically include a frequent and intense urge to urinate and a painful, burning feeling in the bladder or urethra during urination. Older women and men are more likely to be tired, shaky, and weak and have muscle aches and abdominal pain. A fever may mean the infection is in your kidneys. Other symptoms of a kidney infection include pain in your back or sides below the ribs, nausea, and vomiting.  DIAGNOSIS  To diagnose a UTI, your caregiver will ask you about your symptoms. Your caregiver also will ask to provide a urine sample. The urine sample will be tested for bacteria and white blood cells. White blood cells are made by your body to help fight infection.  TREATMENT   Typically, UTIs can be treated with medication. Because most UTIs are caused by a bacterial infection, they usually can be treated with the use of antibiotics. The choice of antibiotic and length of treatment depend on your symptoms and the type of bacteria causing your infection.  HOME CARE INSTRUCTIONS   If you were prescribed antibiotics, take them exactly as your caregiver instructs you. Finish the medication even if you feel better after you  have only taken some of the medication.   Drink enough water and fluids to keep your urine clear or pale yellow.   Avoid caffeine, tea, and carbonated beverages. They tend to irritate your bladder.   Empty your bladder often. Avoid holding urine for long periods of time.   Empty your bladder before and after sexual intercourse.   After a bowel movement, women should cleanse from front to back. Use each tissue only once.  SEEK MEDICAL CARE IF:    You have back pain.   You develop a fever.   Your symptoms do not begin to resolve within 3 days.  SEEK IMMEDIATE MEDICAL CARE IF:    You have severe back pain or lower abdominal pain.   You develop chills.   You have nausea or vomiting.   You have continued burning or discomfort with urination.  MAKE SURE YOU:    Understand these instructions.   Will watch your condition.   Will get help right away if you are not doing well or get worse.  Document Released: 04/07/2005 Document Revised: 12/28/2011 Document Reviewed: 08/06/2011  ExitCare Patient Information 2014 [employer].

## 2013-10-30 LAB — URINE CULTURE
Colony Count: NO GROWTH
Culture: NO GROWTH
Special Requests: NORMAL

## 2013-10-31 NOTE — ED Provider Notes (Signed)
Medical screening examination/treatment/procedure(s) were performed by a resident physician or non-physician practitioner and as the supervising physician I was immediately available for consultation/collaboration.  Apryle Stowell, MD    Cherron Blitzer S Olive Motyka, MD 10/31/13 1326 

## 2013-11-01 ENCOUNTER — Encounter: Payer: Self-pay | Admitting: Nurse Practitioner

## 2013-11-01 ENCOUNTER — Ambulatory Visit (INDEPENDENT_AMBULATORY_CARE_PROVIDER_SITE_OTHER): Payer: Medicare Other | Admitting: Nurse Practitioner

## 2013-11-01 ENCOUNTER — Ambulatory Visit: Payer: Medicare Other | Admitting: Nurse Practitioner

## 2013-11-01 ENCOUNTER — Telehealth: Payer: Self-pay | Admitting: Nurse Practitioner

## 2013-11-01 VITALS — BP 96/65 | HR 68 | Ht 62.0 in | Wt 120.0 lb

## 2013-11-01 DIAGNOSIS — G43009 Migraine without aura, not intractable, without status migrainosus: Secondary | ICD-10-CM

## 2013-11-01 DIAGNOSIS — R112 Nausea with vomiting, unspecified: Secondary | ICD-10-CM

## 2013-11-01 MED ORDER — ONDANSETRON HCL 4 MG PO TABS: 4.0000 mg | ORAL_TABLET | Freq: Three times a day (TID) | ORAL | Status: DC | PRN

## 2013-11-01 NOTE — Telephone Encounter (Signed)
No showed for scheduled appointment 

## 2013-11-01 NOTE — Progress Notes (Signed)
[facility]  PATIENT: [name] DOB: Oct 19, 1980   REASON FOR VISIT: Followup for migraine   HISTORY OF PRESENT ILLNESS: [name], 33 year old female returns for followup she was last seen in this office 05/03/2013. She has been on Topamax in the past but had side effects to that medication. She has been on sumatriptan which worked well for a long time acutely but has stopped working and she is now on [employer]alt which is effective. She had recent hysterectomy 10/16/13 as well as current urinary tract infection on Keflex. She has 5 more days of her antibiotic. No fever or chills today. She has been nauseated and has vomited. She claims is hard to keep down fluids. She does not have a primary care physician. She is asking for some Zofran.   HISTORY:of migraine headaches, bipolar disorder, and restless leg syndrome returns for followup. He was last in the office by Dr. Anne Anderson on 10/24/2012. She was on Topamax at that time but is now off that medication. She has a headache about every 2 weeks relieved with Imitrex. She returns today for evaluation and medication refills.  HISTORY: The patient was seen in January 2014 with restless legs, and the patient indicates that this has improved since she has come off of the fentanyl patch, and Cogentin was added to her regimen. The patient is on Zyprexa in the evenings. The patient has a long-standing history of migraine headaches, and she was [facility]. The headaches have become less frequent over time. The patient uses Imitrex for her headaches which has become less effective at the 50 mg dose. The patient indicates that she has one headache every 2 weeks. The patient indicates that flashing lights or fluorescent lights are activators for her headache. The patient may have photophobia and phonophobia with the headache.      REVIEW OF SYSTEMS: Full 14 system review of systems performed and notable only  for those listed, all others are neg:  Constitutional: Fatigue Cardiovascular: N/A  Ear/Nose/Throat: N/A  Skin: N/A  Eyes: N/A  Respiratory: N/A  Gastroitestinal: Abdominal pain, nausea, vomiting, urinary tract infection Hematology/Lymphatic: N/A  Endocrine: N/A Musculoskeletal: Joint pain, muscle  Allergy/Immunology: N/A  Neurological: Headache  Psychiatric: Depression anxiety  ALLERGIES: Allergies  Allergen Reactions  . Augmentin [Amoxicillin-Pot Clavulanate] Nausea And Vomiting  . Ciprofloxacin Nausea Only  . Erythromycin Nausea And Vomiting    severe  . Gabapentin Other (See Comments)    SI INTENTIONS  . Morphine And Related Itching  . Other Other (See Comments)    Medication:Urised Reaction:"hurts bladder"  . Prozac [Fluoxetine Hcl]     SI  . Pyridium [Phenazopyridine Hcl]     Increased bladder pain    HOME MEDICATIONS: Outpatient Prescriptions Prior to Visit  Medication Sig Dispense Refill  . buPROPion (WELLBUTRIN) 75 MG tablet 75 mg every morning.      . cephALEXin (KEFLEX) 500 MG capsule Take 1 capsule (500 mg total) by mouth 4 (four) times daily. X 7 days  28 capsule  0  . diclofenac sodium (VOLTAREN) 1 % GEL Apply 4 g topically 3 (three) times daily as needed (pain).      Marland Kitchen. doxazosin (CARDURA) 4 MG tablet Take 4 mg by mouth at bedtime as needed and may repeat dose one time if needed (for high blood pressure).       Marland Kitchen. ibuprofen (ADVIL,MOTRIN) 200 MG tablet Take 200 mg by mouth every 6 (six) hours as needed for mild  pain.      . ibuprofen (ADVIL,MOTRIN) 600 MG tablet Take 600 mg by mouth every 6 (six) hours as needed for headache, mild pain or moderate pain.      Marland Kitchen levocetirizine (XYZAL) 5 MG tablet Take 5 mg by mouth every evening.      . mupirocin cream (BACTROBAN) 2 % Apply 1 application topically 2 (two) times daily.      Marland Kitchen oxyCODONE-acetaminophen (PERCOCET/ROXICET) 5-325 MG per tablet Take 1-2 tablets by mouth every 6 (six) hours as needed for moderate pain.   30 tablet  0  . rizatriptan (MAXALT-MLT) 10 MG disintegrating tablet Take 1 tablet (10 mg total) by mouth as needed for migraine. May repeat in 2 hours if needed  10 tablet  2  . traMADol (ULTRAM) 50 MG tablet Take 50 mg by mouth every 8 (eight) hours as needed for pain.       . traZODone (DESYREL) 50 MG tablet Take 150 mg by mouth at bedtime and may repeat dose one time if needed. For depression/sleep      . levonorgestrel (MIRENA) 20 MCG/24HR IUD 1 each by Intrauterine route once.      . Nitrofurantoin Macrocrystal (MACRODANTIN PO) Take by mouth.       No facility-administered medications prior to visit.    PAST MEDICAL HISTORY: Past Medical History  Diagnosis Date  . Bipolar 1 disorder   . Migraine   . Mental disorder   . Anxiety   . Interstitial cystitis   . Galactorrhea   . Chronic back pain   . Seasonal allergies   . Sinusitis   . IBS (irritable bowel syndrome)   . IC (interstitial cystitis)   . Insomnia   . GERD (gastroesophageal reflux disease)     PAST SURGICAL HISTORY: Past Surgical History  Procedure Laterality Date  . Tubal ligation    . Bunionectomy    . Interstim implant placement    . Bowel resection  1998    prolapsed bowel  . Foot surgery      bunionectomy bil. pins put in and taken out  . Bladder surgery      distenstion numerous times  . Laparoscopy Right 06/13/2013    Procedure: LAPAROSCOPY OPERATIVE WITH OVARIAN CYSTECTOMY convert to open 1329;  Surgeon: Purcell Nails, MD;  Location: [facility];  Service: Gynecology;  Laterality: Right;  . Laparotomy Right 06/13/2013    Procedure: EXPLORATORY LAPAROTOMY, ovarian cyst;  Surgeon: Purcell Nails, MD;  Location: [facility];  Service: Gynecology;  Laterality: Right;  . Intrauterine device insertion  2 yrs ago    Mirena    FAMILY HISTORY: Family History  Problem Relation Age of Onset  . Hypertension Mother   . Esophageal cancer Maternal Grandmother   . Hypertension Maternal Grandmother   . Heart disease  Maternal Grandmother   . Stomach cancer Maternal Grandmother   . Diabetes Maternal Grandfather   . Stroke Maternal Grandfather   . Depression Maternal Grandfather   . Anxiety disorder Maternal Grandfather   . Hypertension Maternal Grandfather   . Heart disease Maternal Grandfather   . Heart disease Other   . Diabetes Father   . Heart disease Father   . Stroke Father     SOCIAL HISTORY: History   Social History  . Marital Status: Single    Spouse Name: N/A    Number of Children: 0  . Years of Education: 12   Occupational History  . disability    Social History  Main Topics  . Smoking status: Former Smoker -- 2.5 years    Types: Cigars    Quit date: 06/06/2001  . Smokeless tobacco: Never Used  . Alcohol Use: Yes     Comment: last drink a few days ago  . Drug Use: No     Comment: tramadol prior to surgery on dec 3rd  . Sexual Activity: Yes    Birth Control/ Protection: IUD     Comment: Mirena 10/29/2010; last intercourse one yr ago   Other Topics Concern  . Not on file   Social History Narrative   Patient is single.   Patient lives alone   Patient is not working.   Patient has high school education.   Patient has no children.           PHYSICAL EXAM  Filed Vitals:   11/01/13 1528  BP: 96/65  Pulse: 68 Temp 97.5  Height: 5\' 2"  (1.575 m)  Weight: 120 lb (54.432 kg)   Body mass index is 21.94 kg/(m^2).  Generalized: Well developed, in no acute distress   Neurological examination   Mentation: Alert oriented to time, place, history taking. Follows all commands speech and language fluent  Cranial nerve II-XII: .Pupils were equal round reactive to light extraocular movements were full, visual field were full on confrontational test. Facial sensation and strength were normal. hearing was intact to finger rubbing bilaterally. Uvula tongue midline. head turning and shoulder shrug were normal and symmetric.Tongue protrusion into cheek strength was  normal. Motor: normal bulk and tone, full strength in the BUE, BLE, fine finger movements normal, no pronator drift. No focal weakness Coordination: finger-nose-finger, heel-to-shin bilaterally, no dysmetria Reflexes: Brachioradialis 2/2, biceps 2/2, triceps 2/2, patellar 2/2, Achilles 2/2, plantar responses were flexor bilaterally. Gait and Station: Rising up from seated position without assistance, normal stance,  moderate stride, good arm swing, smooth turning, able to perform tiptoe, and heel walking without difficulty. Tandem gait is steady  DIAGNOSTIC DATA (LABS, IMAGING, TESTING) - I reviewed patient records, labs, notes, testing and imaging myself where available.  Lab Results  Component Value Date   WBC 5.0 09/26/2013   HGB 13.7 09/26/2013   HCT 39.5 09/26/2013   MCV 89.4 09/26/2013   PLT 224 09/26/2013      Component Value Date/Time   NA 137 09/26/2013 0947   K 3.9 09/26/2013 0947   CL 100 09/26/2013 0947   CO2 22 09/26/2013 0947   GLUCOSE 111* 09/26/2013 0947   BUN 7 09/26/2013 0947   CREATININE 0.77 09/26/2013 0947   CALCIUM 9.4 09/26/2013 0947   PROT 6.2 06/24/2013 1825   ALBUMIN 3.7 06/24/2013 1825   AST 14 06/24/2013 1825   ALT 8 06/24/2013 1825   ALKPHOS 64 06/24/2013 1825   BILITOT <0.1* 06/24/2013 1825   GFRNONAA >90 09/26/2013 0947   GFRAA >90 09/26/2013 0947        ASSESSMENT AND PLAN  33 y.o. year old female  has a past medical history of Bipolar 1 disorder; Migraine; Mental disorder; Anxiety; Interstitial cystitis;  Chronic back pain; Seasonal allergies; Sinusitis; IBS (irritable bowel syndrome); IC (interstitial cystitis); Insomnia; and GERD (gastroesophageal reflux disease). currently UTI on antibiotics. She has had some nausea and vomiting today. She claims she does not have anything to take for nausea and she is requesting Zofran  Will try to get increased amount of Maxalt per insurance carrier Increase fluids due to  Nausea and vomiting Zofran be called into  your pharmacy, if nausea and  vomiting does not resolve go to urgent care. If your urinary tract infection does not clear you need to be seen again by urgent care or neurologist Make sure you take the entire RX  of antibiotic Followup in 6 months with [place].  Patient's appointment was at 9 AM, patient showed up for the appointment at 3:15 Nilda Riggs, [facility], [facility], APRN  [facility] [address], [address] [place], [place] [address] [phone]

## 2013-11-01 NOTE — Patient Instructions (Signed)
Will try to get increased amount of Maxalt per insurance carrier Increase fluids due to  Nausea Zofran be called into your pharmacy If your urinary tract infection does not clear you need to be seen again by urgent care or neurologist Make sure you take the entire dose of antibiotic Followup in 6 months with us

## 2013-11-01 NOTE — Progress Notes (Signed)
I have read the note, and I agree with the clinical assessment and plan.  Koriana Stepien K Shley Dolby   

## 2013-11-10 ENCOUNTER — Emergency Department (INDEPENDENT_AMBULATORY_CARE_PROVIDER_SITE_OTHER)
Admission: EM | Admit: 2013-11-10 | Discharge: 2013-11-10 | Disposition: A | Discharge status: Home / Self Care | Payer: Medicare Other | Source: Home / Self Care | Attending: Family Medicine | Admitting: Family Medicine

## 2013-11-10 ENCOUNTER — Encounter (HOSPITAL_COMMUNITY): Payer: Self-pay | Admitting: Emergency Medicine

## 2013-11-10 DIAGNOSIS — N318 Other neuromuscular dysfunction of bladder: Secondary | ICD-10-CM

## 2013-11-10 DIAGNOSIS — N3281 Overactive bladder: Secondary | ICD-10-CM

## 2013-11-10 LAB — POCT URINALYSIS DIP (DEVICE)
Glucose, UA: 100 mg/dL — AB
Ketones, ur: 15 mg/dL — AB
Leukocytes, UA: NEGATIVE
Nitrite: POSITIVE — AB
Protein, ur: 100 mg/dL — AB
Specific Gravity, Urine: >=1.03 (ref 1.005–1.030)
Urobilinogen, UA: 1 mg/dL (ref 0.0–1.0)
pH: 5.5 (ref 5.0–8.0)

## 2013-11-10 LAB — POCT PREGNANCY, URINE: Preg Test, Ur: NEGATIVE

## 2013-11-10 MED ORDER — TOLTERODINE TARTRATE ER 4 MG PO CP24: 4.0000 mg | ORAL_CAPSULE | Freq: Every day | ORAL | Status: DC

## 2013-11-10 MED ORDER — NITROFURANTOIN MONOHYD MACRO 100 MG PO CAPS: 100.0000 mg | ORAL_CAPSULE | Freq: Two times a day (BID) | ORAL | Status: DC

## 2013-11-10 NOTE — Discharge Instructions (Signed)
Take all of medicine as directed, drink lots of fluids, see your doctor if further problems. °

## 2013-11-10 NOTE — ED Notes (Signed)
Pt  Seen  About 1  Week  Ago  For  uti  Symptoms      Pt  Reports  Did  Not take  meds  As  She   Should  Have  Reports  Still  Has  Symptoms         She   Is  Awake  And  Alert  And  Oriented  And  Is  In no  Distress

## 2013-11-10 NOTE — ED Provider Notes (Signed)
CSN: [id number]     Arrival date & time 11/10/13  1105 History   First MD Initiated Contact with Patient 11/10/13 1138     Chief Complaint  Patient presents with  . Urinary Frequency   (Consider location/radiation/quality/duration/timing/severity/associated sxs/prior Treatment) Patient is a 33 y.o. female presenting with frequency. The history is provided by the patient.  Urinary Frequency This is a recurrent problem. The current episode started more than 1 week ago (seen 4/20 for same problem, given keflex, sx  continue,,). The problem has been gradually worsening. Pertinent negatives include no chest pain and no abdominal pain.    Past Medical History  Diagnosis Date  . Bipolar 1 disorder   . Migraine   . Mental disorder   . Anxiety   . Interstitial cystitis   . Galactorrhea   . Chronic back pain   . Seasonal allergies   . Sinusitis   . IBS (irritable bowel syndrome)   . IC (interstitial cystitis)   . Insomnia   . GERD (gastroesophageal reflux disease)    Past Surgical History  Procedure Laterality Date  . Tubal ligation    . Bunionectomy    . Interstim implant placement    . Bowel resection  1998    prolapsed bowel  . Foot surgery      bunionectomy bil. pins put in and taken out  . Bladder surgery      distenstion numerous times  . Laparoscopy Right 06/13/2013    Procedure: LAPAROSCOPY OPERATIVE WITH OVARIAN CYSTECTOMY convert to open 1329;  Surgeon: Purcell NailsAngela Y Roberts, MD;  Location: [facility];  Service: Gynecology;  Laterality: Right;  . Laparotomy Right 06/13/2013    Procedure: EXPLORATORY LAPAROTOMY, ovarian cyst;  Surgeon: Purcell NailsAngela Y Roberts, MD;  Location: [facility];  Service: Gynecology;  Laterality: Right;  . Intrauterine device insertion  2 yrs ago    Mirena   Family History  Problem Relation Age of Onset  . Hypertension Mother   . Esophageal cancer Maternal Grandmother   . Hypertension Maternal Grandmother   . Heart disease Maternal Grandmother   . Stomach cancer  Maternal Grandmother   . Diabetes Maternal Grandfather   . Stroke Maternal Grandfather   . Depression Maternal Grandfather   . Anxiety disorder Maternal Grandfather   . Hypertension Maternal Grandfather   . Heart disease Maternal Grandfather   . Heart disease Other   . Diabetes Father   . Heart disease Father   . Stroke Father    History  Substance Use Topics  . Smoking status: Former Smoker -- 2.5 years    Types: Cigars    Quit date: 06/06/2001  . Smokeless tobacco: Never Used  . Alcohol Use: Yes     Comment: last drink a few days ago   OB History   Grav Para Term Preterm Abortions TAB SAB Ect Mult Living   0              Review of Systems  Constitutional: Negative.   Cardiovascular: Negative for chest pain.  Gastrointestinal: Negative.  Negative for abdominal pain.  Genitourinary: Positive for dysuria, urgency and frequency. Negative for vaginal discharge.    Allergies  Augmentin; Ciprofloxacin; Erythromycin; Gabapentin; Morphine and related; Other; Prozac; and Pyridium  Home Medications   Prior to Admission medications   Medication Sig Start Date End Date Taking? Authorizing Provider  buPROPion (WELLBUTRIN) 75 MG tablet 75 mg every morning. 04/12/13   Historical Provider, MD  cephALEXin (KEFLEX) 500 MG capsule Take 1 capsule (  500 mg total) by mouth 4 (four) times daily. X 7 days 10/29/13   Jess BartersJennifer Lee Presson, PA  diclofenac sodium (VOLTAREN) 1 % GEL Apply 4 g topically 3 (three) times daily as needed (pain).    Historical Provider, MD  doxazosin (CARDURA) 4 MG tablet Take 4 mg by mouth at bedtime as needed and may repeat dose one time if needed (for high blood pressure).     Historical Provider, MD  ibuprofen (ADVIL,MOTRIN) 200 MG tablet Take 200 mg by mouth every 6 (six) hours as needed for mild pain.    Historical Provider, MD  ibuprofen (ADVIL,MOTRIN) 600 MG tablet Take 600 mg by mouth every 6 (six) hours as needed for headache, mild pain or moderate pain.     Historical Provider, MD  levocetirizine (XYZAL) 5 MG tablet Take 5 mg by mouth every evening.    Historical Provider, MD  mupirocin cream (BACTROBAN) 2 % Apply 1 application topically 2 (two) times daily.    Historical Provider, MD  ondansetron (ZOFRAN) 4 MG tablet Take 1 tablet (4 mg total) by mouth every 8 (eight) hours as needed for nausea or vomiting. 11/01/13   Nilda RiggsNancy Carolyn Sandall, NP  oxyCODONE-acetaminophen (PERCOCET/ROXICET) 5-325 MG per tablet Take 1-2 tablets by mouth every 6 (six) hours as needed for moderate pain. 06/14/13   Purcell NailsAngela Y Roberts, MD  rizatriptan (MAXALT-MLT) 10 MG disintegrating tablet Take 1 tablet (10 mg total) by mouth as needed for migraine. May repeat in 2 hours if needed 10/05/13   Nilda RiggsNancy Carolyn Hua, NP  traMADol (ULTRAM) 50 MG tablet Take 50 mg by mouth every 8 (eight) hours as needed for pain.  10/04/12   Historical Provider, MD  traZODone (DESYREL) 50 MG tablet Take 150 mg by mouth at bedtime and may repeat dose one time if needed. For depression/sleep 09/05/12   Sanjuana KavaAgnes I Nwoko, NP   BP 128/85  Pulse 104  Temp(Src) 98.2 F (36.8 C) (Oral)  Resp 18  SpO2 98%  LMP 09/27/2013 Physical Exam  Constitutional: She is oriented to person, place, and time.  Abdominal: Soft. Normal appearance and bowel sounds are normal. She exhibits no distension and no mass. There is tenderness in the suprapubic area. There is no rebound and no guarding.  Neurological: She is alert and oriented to person, place, and time.  Skin: Skin is warm and dry.    ED Course  Procedures (including critical care time) Labs Review Labs Reviewed  POCT URINALYSIS DIP (DEVICE) - Abnormal; Notable for the following:    Glucose, UA 100 (*)    Bilirubin Urine MODERATE (*)    Ketones, ur 15 (*)    Hgb urine dipstick TRACE (*)    Protein, ur 100 (*)    Nitrite POSITIVE (*)    All other components within normal limits  URINE CULTURE  POCT PREGNANCY, URINE    Imaging Review No results  found.   MDM   1. Overactive bladder       Linna HoffJames D Keymora Grillot, MD 11/10/13 1217

## 2013-11-12 LAB — URINE CULTURE: Colony Count: 50000

## 2013-11-20 NOTE — ED Notes (Signed)
Message on answering machine from [facility], requesting prior authorization or substitution for the Detrol Rx, as neither the brand name or generic form are covered on her insurance. Spoke w Dr Artis FlockKindl who authorized vesicare, but this is not covered. After another conversation w Dr Artis FlockKindl and pharmacist, oxybutin 5 mg BID, quant #30 , was authorized  (covered by insurance)

## 2013-11-23 ENCOUNTER — Emergency Department (INDEPENDENT_AMBULATORY_CARE_PROVIDER_SITE_OTHER)
Admission: EM | Admit: 2013-11-23 | Discharge: 2013-11-23 | Disposition: A | Discharge status: Home / Self Care | Payer: Medicare Other | Source: Home / Self Care | Attending: Emergency Medicine | Admitting: Emergency Medicine

## 2013-11-23 ENCOUNTER — Encounter (HOSPITAL_COMMUNITY): Payer: Self-pay | Admitting: Emergency Medicine

## 2013-11-23 DIAGNOSIS — N3000 Acute cystitis without hematuria: Secondary | ICD-10-CM

## 2013-11-23 DIAGNOSIS — N390 Urinary tract infection, site not specified: Secondary | ICD-10-CM

## 2013-11-23 DIAGNOSIS — N3010 Interstitial cystitis (chronic) without hematuria: Secondary | ICD-10-CM

## 2013-11-23 LAB — POCT PREGNANCY, URINE: Preg Test, Ur: NEGATIVE

## 2013-11-23 LAB — GLUCOSE, CAPILLARY: Glucose-Capillary: 78 mg/dL (ref 70–99)

## 2013-11-23 LAB — POCT URINALYSIS DIP (DEVICE)
Glucose, UA: 250 mg/dL — AB
Hgb urine dipstick: NEGATIVE
Ketones, ur: 15 mg/dL — AB
Leukocytes, UA: NEGATIVE
Nitrite: POSITIVE — AB
Protein, ur: 30 mg/dL — AB
Specific Gravity, Urine: 1.01 (ref 1.005–1.030)
Urobilinogen, UA: 2 mg/dL — ABNORMAL HIGH (ref 0.0–1.0)
pH: 5.5 (ref 5.0–8.0)

## 2013-11-23 MED ORDER — CEPHALEXIN 500 MG PO CAPS: 500.0000 mg | ORAL_CAPSULE | Freq: Three times a day (TID) | ORAL | Status: DC

## 2013-11-23 MED ORDER — HYDROCODONE-ACETAMINOPHEN 5-325 MG PO TABS: ORAL_TABLET | ORAL | Status: DC

## 2013-11-23 MED ORDER — HYDROXYZINE HCL 25 MG PO TABS: 25.0000 mg | ORAL_TABLET | Freq: Three times a day (TID) | ORAL | Status: DC | PRN

## 2013-11-23 NOTE — Discharge Instructions (Signed)
Urinary Tract Infection Urinary tract infections (UTIs) can develop anywhere along your urinary tract. Your urinary tract is your body's drainage system for removing wastes and extra water. Your urinary tract includes two kidneys, two ureters, a bladder, and a urethra. Your kidneys are a pair of bean-shaped organs. Each kidney is about the size of your fist. They are located below your ribs, one on each side of your spine. CAUSES Infections are caused by microbes, which are microscopic organisms, including fungi, viruses, and bacteria. These organisms are so small that they can only be seen through a microscope. Bacteria are the microbes that most commonly cause UTIs. SYMPTOMS  Symptoms of UTIs may vary by age and gender of the patient and by the location of the infection. Symptoms in young women typically include a frequent and intense urge to urinate and a painful, burning feeling in the bladder or urethra during urination. Older women and men are more likely to be tired, shaky, and weak and have muscle aches and abdominal pain. A fever may mean the infection is in your kidneys. Other symptoms of a kidney infection include pain in your back or sides below the ribs, nausea, and vomiting. DIAGNOSIS To diagnose a UTI, your caregiver will ask you about your symptoms. Your caregiver also will ask to provide a urine sample. The urine sample will be tested for bacteria and white blood cells. White blood cells are made by your body to help fight infection. TREATMENT  Typically, UTIs can be treated with medication. Because most UTIs are caused by a bacterial infection, they usually can be treated with the use of antibiotics. The choice of antibiotic and length of treatment depend on your symptoms and the type of bacteria causing your infection. HOME CARE INSTRUCTIONS  If you were prescribed antibiotics, take them exactly as your caregiver instructs you. Finish the medication even if you feel better after you  have only taken some of the medication.  Drink enough water and fluids to keep your urine clear or pale yellow.  Avoid caffeine, tea, and carbonated beverages. They tend to irritate your bladder.  Empty your bladder often. Avoid holding urine for long periods of time.  Empty your bladder before and after sexual intercourse.  After a bowel movement, women should cleanse from front to back. Use each tissue only once. SEEK MEDICAL CARE IF:   You have back pain.  You develop a fever.  Your symptoms do not begin to resolve within 3 days. SEEK IMMEDIATE MEDICAL CARE IF:   You have severe back pain or lower abdominal pain.  You develop chills.  You have nausea or vomiting.  You have continued burning or discomfort with urination. MAKE SURE YOU:   Understand these instructions.  Will watch your condition.  Will get help right away if you are not doing well or get worse. Document Released: 04/07/2005 Document Revised: 12/28/2011 Document Reviewed: 08/06/2011 [facility] Patient Information 2014 [place], [place].  Interstitial Cystitis Interstitial cystitis (IC) is a condition that results in discomfort or pain in the bladder and the surrounding pelvic region. The symptoms can be different from case to case and even in the same individual. People may experience:  Mild discomfort.  Pressure.  Tenderness.  Intense pain in the bladder and pelvic area. CAUSES  Because IC varies so much in symptoms and severity, people studying this disease believe it is not one but several diseases. Some caregivers use the term painful bladder syndrome (PBS) to describe cases with painful urinary symptoms.  This may not meet the strictest definition of IC. The term IC / PBS includes all cases of urinary pain that cannot be connected to other causes, such as infection or urinary stones.  SYMPTOMS  Symptoms may include:  An urgent need to urinate.  A frequent need to urinate.  A combination of  these symptoms. Pain may change in intensity as the bladder fills with urine or as it empties. Women's symptoms often get worse during menstruation. They may sometimes experience pain with vaginal intercourse. Some of the symptoms of IC / PBS seem like those of bacterial infection. Tests do not show infection. IC / PBS is far more common in women than in men.  DIAGNOSIS  The diagnosis of IC / PBS is based on:  Presence of pain related to the bladder, usually along with problems of frequency and urgency.  Not finding other diseases that could cause the symptoms.  Diagnostic tests that help rule out other diseases include:  Urinalysis.  Urine culture.  Cystoscopy.  Biopsy of the bladder wall.  Distension of the bladder under anesthesia.  Urine cytology.  Laboratory examination of prostate secretions. A biopsy is a tissue sample that can be looked at under a microscope. Samples of the bladder and urethra may be removed during a cystoscopy. A biopsy helps rule out bladder cancer. TREATMENT  Scientists have not yet found a cure for IC / PBS. Patients with IC / PBS do not get better with antibiotic therapy. Caregivers cannot predict who will respond best to which treatment. Symptoms may disappear without explanation. Disappearing symptoms may coincide with an event such as a change in diet or treatment. Even when symptoms disappear, they may return after days, weeks, months, or years.  Because the causes of IC / PBS are unknown, current treatments are aimed at relieving symptoms. Many people are helped by one or a combination of the treatments. As researchers learn more about IC / PBS, the list of potential treatments will change. Patients should discuss their options with a caregiver. SURGERY  Surgery should be considered only if all available treatments have failed and the pain is disabling. Many approaches and techniques are used. Each approach has its own advantages and complications.  Advantages and complications should be discussed with a urologist. Your caregiver may recommend consulting another urologist for a second opinion. Most caregivers are reluctant to operate because the outcome is unpredictable. Some people still have symptoms after surgery.  People considering surgery should discuss the potential risks and benefits, side effects, and long- and short-term complications with their family, as well as with people who have already had the procedure. Surgery requires anesthesia, hospitalization, and in some cases weeks or months of recovery. As the complexity of the procedure increases, so do the chances for complications and for failure. HOME CARE INSTRUCTIONS   All drugs, even those sold over the counter, have side effects. Patients should always consult a caregiver before using any drug for an extended amount of time. Only take over-the-counter or prescription medicines for pain, discomfort, or fever as directed by your caregiver.  Many patients feel that smoking makes their symptoms worse. How the by-products of tobacco that are excreted in the urine affect IC / PBS is unknown. Smoking is the major known cause of bladder cancer. One of the best things smokers can do for their bladder and their overall health is to quit.  Many patients feel that gentle stretching exercises help relieve IC / PBS symptoms.  Methods vary,  but basically patients decide to empty their bladder at designated times and use relaxation techniques and distractions to keep to the schedule. Gradually, patients try to lengthen the time between scheduled voids. A diary in which to record voiding times is usually helpful in keeping track of progress. MAKE SURE YOU:   Understand these instructions.  Will watch your condition.  Will get help right away if you are not doing well or get worse. Document Released: 02/27/2004 Document Revised: 09/20/2011 Document Reviewed: 05/13/2008 [facility] Patient  Information 2014 [place], [place].

## 2013-11-23 NOTE — ED Provider Notes (Signed)
Chief Complaint   Chief Complaint  Patient presents with  . Abdominal Pain    IC flare up    History of Present Illness   [name] is a 33 year old female with interstitial cystitis and fibromyalgia. Since yesterday she's either had a flare up of her interstitial cystitis or urinary tract infection with increasing dysuria, frequency, urgency, and some blood in her urine. She has back abdominal pain, nausea, and vomiting. She denies any GYN symptoms and she is status post hysterectomy. No fever or chills. She's had interstitial cystitis for years and sees Dr. Marcelyn Bruinsobert Evans. Right now she's on tramadol, oxybutynin, and Macrobid. She has had 2 visits here for urinary tract infection symptoms. Her cultures never grew out anything. She was treated with a course of Keflex and a course of Macrobid.  Review of Systems   Other than as noted above, the patient denies any of the following symptoms: General:  No fevers or chills. GI:  No abdominal pain, back pain, nausea, or vomiting. GU:  No hematuria or incontinence. GYN:  No discharge, itching, vulvar pain or lesions, pelvic pain, or abnormal vaginal bleeding.  PMFSH   Past medical history, family history, social history, meds, and allergies were reviewed.  Current meds include Wellbutrin, Voltaren gel, Cardura, size L., Percocet, Maxalt, Detrol LA, tramadol, and trazodone. She has multiple medication allergies including Augmentin, ciprofloxacin, erythromycin, gabapentin, morphine, Prozac, and Pyridium. Other illnesses include bipolar 1 disorder, migraines, anxiety, interstitial cystitis, galactorrhea, seasonal allergies, IBS, and insomnia, and GERD.  Physical Examination     Vital signs:  BP 123/64  Pulse 60  Temp(Src) 98.1 F (36.7 C) (Oral)  Resp 12  SpO2 100%  LMP 09/27/2013 Gen:  Alert, oriented, in no distress. Lungs:  Clear to auscultation, no wheezes, rales or rhonchi. Heart:  Regular rhythm, no gallop or murmer. Abdomen:   Flat and soft. There was slight suprapubic pain to palpation.  No guarding, or rebound.  No hepato-splenomegaly or mass.  Bowel sounds were normally active.  No hernia. Back:  No CVA tenderness.  Skin:  Clear, warm and dry.  Labs   Results for orders placed during the hospital encounter of 11/23/13  GLUCOSE, CAPILLARY      Result Value Ref Range   Glucose-Capillary 78  70 - 99 mg/dL  POCT URINALYSIS DIP (DEVICE)      Result Value Ref Range   Glucose, UA 250 (*) NEGATIVE mg/dL   Bilirubin Urine SMALL (*) NEGATIVE   Ketones, ur 15 (*) NEGATIVE mg/dL   Specific Gravity, Urine 1.010  1.005 - 1.030   Hgb urine dipstick NEGATIVE  NEGATIVE   pH 5.5  5.0 - 8.0   Protein, ur 30 (*) NEGATIVE mg/dL   Urobilinogen, UA 2.0 (*) 0.0 - 1.0 mg/dL   Nitrite POSITIVE (*) NEGATIVE   Leukocytes, UA NEGATIVE  NEGATIVE  POCT PREGNANCY, URINE      Result Value Ref Range   Preg Test, Ur NEGATIVE  NEGATIVE     A urine culture was obtained.  Results are pending at this time and we will call about any positive results.  Assessment   The primary encounter diagnosis was Acute cystitis. A diagnosis of Interstitial cystitis was also pertinent to this visit.   No evidence of pyelonephritis.    Plan   1.  Meds:  The following meds were prescribed:   Discharge Medication List as of 11/23/2013  7:35 PM    START taking these medications   Details  !!  cephALEXin (KEFLEX) 500 MG capsule Take 1 capsule (500 mg total) by mouth 3 (three) times daily., Starting 11/23/2013, Until Discontinued, Normal    HYDROcodone-acetaminophen (NORCO/VICODIN) 5-325 MG per tablet 1 to 2 tabs every 4 to 6 hours as needed for pain., Print    hydrOXYzine (ATARAX/VISTARIL) 25 MG tablet Take 1 tablet (25 mg total) by mouth every 8 (eight) hours as needed (for interstitial cystitis)., Starting 11/23/2013, Until Discontinued, Normal     !! - Potential duplicate medications found. Please discuss with provider.      2.  Patient  Education/Counseling:  The patient was given appropriate handouts, self care instructions, and instructed in symptomatic relief. The patient was told to avoid intercourse for 10 days, get extra fluids, and return for a follow up with her primary care doctor at the completion of treatment for a repeat UA and culture.    3.  Follow up:  The patient was told to follow up here if no better in 3 to 4 days, or sooner if becoming worse in any way, and given some red flag symptoms such as fever, persistent vomiting, or severe flank or abdominal pain which would prompt immediate return.     Reuben Likesavid C Hilman Kissling, MD 11/23/13 2204

## 2013-11-23 NOTE — ED Notes (Signed)
Reports having and IC flare up.  On set yesterday evening.

## 2013-11-25 LAB — URINE CULTURE
Colony Count: 10000
Special Requests: NORMAL

## 2013-11-26 NOTE — ED Notes (Addendum)
Urine culture: 10,000 colonies yeast.  Treated with Keflex. Message sent to Dr. Lorenz CoasterKeller.  No further action needed. Desiree LucySuzanne M [facility] 11/26/2013

## 2013-11-28 ENCOUNTER — Other Ambulatory Visit: Payer: Self-pay

## 2013-11-28 MED ORDER — ONDANSETRON HCL 4 MG PO TABS: 4.0000 mg | ORAL_TABLET | Freq: Three times a day (TID) | ORAL | Status: DC | PRN

## 2013-11-29 ENCOUNTER — Ambulatory Visit: Payer: Medicare Other | Attending: Obstetrics and Gynecology | Admitting: Physical Therapy

## 2013-11-29 DIAGNOSIS — M629 Disorder of muscle, unspecified: Secondary | ICD-10-CM | POA: Insufficient documentation

## 2013-11-29 DIAGNOSIS — M2420 Disorder of ligament, unspecified site: Secondary | ICD-10-CM | POA: Insufficient documentation

## 2013-11-29 DIAGNOSIS — IMO0001 Reserved for inherently not codable concepts without codable children: Secondary | ICD-10-CM | POA: Insufficient documentation

## 2013-12-06 ENCOUNTER — Ambulatory Visit: Payer: Medicare Other | Admitting: Physical Therapy

## 2013-12-13 ENCOUNTER — Ambulatory Visit: Payer: Medicare Other | Attending: Obstetrics and Gynecology | Admitting: Physical Therapy

## 2013-12-13 DIAGNOSIS — IMO0001 Reserved for inherently not codable concepts without codable children: Secondary | ICD-10-CM | POA: Diagnosis present

## 2013-12-13 DIAGNOSIS — M629 Disorder of muscle, unspecified: Secondary | ICD-10-CM | POA: Diagnosis not present

## 2013-12-13 DIAGNOSIS — M2420 Disorder of ligament, unspecified site: Secondary | ICD-10-CM | POA: Diagnosis not present

## 2013-12-20 ENCOUNTER — Ambulatory Visit: Payer: Medicare Other | Admitting: Physical Therapy

## 2013-12-20 DIAGNOSIS — IMO0001 Reserved for inherently not codable concepts without codable children: Secondary | ICD-10-CM | POA: Diagnosis not present

## 2013-12-27 ENCOUNTER — Ambulatory Visit: Payer: Medicare Other | Admitting: Physical Therapy

## 2013-12-27 DIAGNOSIS — IMO0001 Reserved for inherently not codable concepts without codable children: Secondary | ICD-10-CM | POA: Diagnosis not present

## 2014-01-03 ENCOUNTER — Ambulatory Visit: Payer: Medicare Other | Admitting: Physical Therapy

## 2014-01-03 DIAGNOSIS — IMO0001 Reserved for inherently not codable concepts without codable children: Secondary | ICD-10-CM | POA: Diagnosis not present

## 2014-01-10 ENCOUNTER — Ambulatory Visit: Payer: Medicare Other | Attending: Obstetrics and Gynecology | Admitting: Physical Therapy

## 2014-01-10 DIAGNOSIS — IMO0001 Reserved for inherently not codable concepts without codable children: Secondary | ICD-10-CM | POA: Diagnosis present

## 2014-01-10 DIAGNOSIS — M2420 Disorder of ligament, unspecified site: Secondary | ICD-10-CM | POA: Insufficient documentation

## 2014-01-10 DIAGNOSIS — M629 Disorder of muscle, unspecified: Secondary | ICD-10-CM | POA: Insufficient documentation

## 2014-01-17 ENCOUNTER — Ambulatory Visit: Payer: Medicare Other | Admitting: Physical Therapy

## 2014-01-17 DIAGNOSIS — IMO0001 Reserved for inherently not codable concepts without codable children: Secondary | ICD-10-CM | POA: Diagnosis not present

## 2014-01-24 ENCOUNTER — Ambulatory Visit: Payer: Medicare Other | Admitting: Physical Therapy

## 2014-02-07 ENCOUNTER — Ambulatory Visit: Payer: Medicare Other | Admitting: Physical Therapy

## 2014-02-07 DIAGNOSIS — IMO0001 Reserved for inherently not codable concepts without codable children: Secondary | ICD-10-CM | POA: Diagnosis not present

## 2014-02-14 ENCOUNTER — Encounter: Payer: Self-pay | Admitting: Physical Therapy

## 2014-02-28 ENCOUNTER — Ambulatory Visit: Payer: Medicare Other | Admitting: Physical Therapy

## 2014-03-17 ENCOUNTER — Emergency Department (INDEPENDENT_AMBULATORY_CARE_PROVIDER_SITE_OTHER)
Admission: EM | Admit: 2014-03-17 | Discharge: 2014-03-17 | Disposition: A | Discharge status: Home / Self Care | Payer: Medicare Other | Source: Home / Self Care | Attending: Family Medicine | Admitting: Family Medicine

## 2014-03-17 ENCOUNTER — Encounter (HOSPITAL_COMMUNITY): Payer: Self-pay | Admitting: Emergency Medicine

## 2014-03-17 ENCOUNTER — Other Ambulatory Visit (HOSPITAL_COMMUNITY)
Admission: RE | Admit: 2014-03-17 | Discharge: 2014-03-17 | Disposition: A | Discharge status: Home / Self Care | Payer: Medicare Other | Source: Ambulatory Visit | Attending: Family Medicine | Admitting: Family Medicine

## 2014-03-17 DIAGNOSIS — N390 Urinary tract infection, site not specified: Secondary | ICD-10-CM

## 2014-03-17 DIAGNOSIS — Z113 Encounter for screening for infections with a predominantly sexual mode of transmission: Secondary | ICD-10-CM | POA: Diagnosis present

## 2014-03-17 DIAGNOSIS — N760 Acute vaginitis: Secondary | ICD-10-CM | POA: Insufficient documentation

## 2014-03-17 LAB — POCT URINALYSIS DIP (DEVICE)
Glucose, UA: 250 mg/dL — AB
Hgb urine dipstick: NEGATIVE
Ketones, ur: 15 mg/dL — AB
Nitrite: POSITIVE — AB
Protein, ur: 100 mg/dL — AB
Specific Gravity, Urine: <=1.005 (ref 1.005–1.030)
Urobilinogen, UA: >=8 mg/dL (ref 0.0–1.0)
pH: 5 (ref 5.0–8.0)

## 2014-03-17 MED ORDER — ONDANSETRON HCL 4 MG PO TABS: 4.0000 mg | ORAL_TABLET | Freq: Three times a day (TID) | ORAL | Status: DC | PRN

## 2014-03-17 MED ORDER — CEPHALEXIN 500 MG PO CAPS: 500.0000 mg | ORAL_CAPSULE | Freq: Four times a day (QID) | ORAL | Status: DC

## 2014-03-17 NOTE — Discharge Instructions (Signed)
You are being treated for a urinary tract infection and your urine will be held for a 3 day culture. If results indicate any need for changes in treatment you will be notified by phone. The results from your vaginal swabs should be available in the next 1-3 days and if the results indicate the needs for additional treatment, you will be notified by phone. If symptoms do not improve with treatment, please follow up with your PCP or OBGYN provider.  Urinary Tract Infection Urinary tract infections (UTIs) can develop anywhere along your urinary tract. Your urinary tract is your body's drainage system for removing wastes and extra water. Your urinary tract includes two kidneys, two ureters, a bladder, and a urethra. Your kidneys are a pair of bean-shaped organs. Each kidney is about the size of your fist. They are located below your ribs, one on each side of your spine. CAUSES Infections are caused by microbes, which are microscopic organisms, including fungi, viruses, and bacteria. These organisms are so small that they can only be seen through a microscope. Bacteria are the microbes that most commonly cause UTIs. SYMPTOMS  Symptoms of UTIs may vary by age and gender of the patient and by the location of the infection. Symptoms in young women typically include a frequent and intense urge to urinate and a painful, burning feeling in the bladder or urethra during urination. Older women and men are more likely to be tired, shaky, and weak and have muscle aches and abdominal pain. A fever may mean the infection is in your kidneys. Other symptoms of a kidney infection include pain in your back or sides below the ribs, nausea, and vomiting. DIAGNOSIS To diagnose a UTI, your caregiver will ask you about your symptoms. Your caregiver also will ask to provide a urine sample. The urine sample will be tested for bacteria and white blood cells. White blood cells are made by your body to help fight infection. TREATMENT    Typically, UTIs can be treated with medication. Because most UTIs are caused by a bacterial infection, they usually can be treated with the use of antibiotics. The choice of antibiotic and length of treatment depend on your symptoms and the type of bacteria causing your infection. HOME CARE INSTRUCTIONS  If you were prescribed antibiotics, take them exactly as your caregiver instructs you. Finish the medication even if you feel better after you have only taken some of the medication.  Drink enough water and fluids to keep your urine clear or pale yellow.  Avoid caffeine, tea, and carbonated beverages. They tend to irritate your bladder.  Empty your bladder often. Avoid holding urine for long periods of time.  Empty your bladder before and after sexual intercourse.  After a bowel movement, women should cleanse from front to back. Use each tissue only once. SEEK MEDICAL CARE IF:   You have back pain.  You develop a fever.  Your symptoms do not begin to resolve within 3 days. SEEK IMMEDIATE MEDICAL CARE IF:   You have severe back pain or lower abdominal pain.  You develop chills.  You have nausea or vomiting.  You have continued burning or discomfort with urination. MAKE SURE YOU:   Understand these instructions.  Will watch your condition.  Will get help right away if you are not doing well or get worse. Document Released: 04/07/2005 Document Revised: 12/28/2011 Document Reviewed: 08/06/2011 [facility] Patient Information 2015 [place], [place]. This information is not intended to replace advice given to you by your  health care provider. Make sure you discuss any questions you have with your health care provider. ° °

## 2014-03-17 NOTE — ED Provider Notes (Signed)
CSN: [id number]     Arrival date & time 03/17/14  1430 History   First MD Initiated Contact with Patient 03/17/14 1502     Chief Complaint  Patient presents with  . Vaginal Itching   (Consider location/radiation/quality/duration/timing/severity/associated sxs/prior Treatment) HPI Comments: Patient reports 24 hours hx of vaginal irritation. States she was recently diagnosed with yeast vaginitis (by her PCP) and a UTI and was prescribed Macrobid and Diflucan. States symptoms initially began to improve, but have returned over last 1-2 days. Has completed diflucan, but is still taking macrobid. Denies fever, vaginal discharge, vaginal bleeding, flank pain, N/V. States she was recently tested for STIs and all testing was reported as negative.  Is s/p hysterectomy [name] 2015  The history is provided by the patient.    Past Medical History  Diagnosis Date  . Bipolar 1 disorder   . Migraine   . Mental disorder   . Anxiety   . Interstitial cystitis   . Galactorrhea   . Chronic back pain   . Seasonal allergies   . Sinusitis   . IBS (irritable bowel syndrome)   . IC (interstitial cystitis)   . Insomnia   . GERD (gastroesophageal reflux disease)    Past Surgical History  Procedure Laterality Date  . Tubal ligation    . Bunionectomy    . Interstim implant placement    . Bowel resection  1998    prolapsed bowel  . Foot surgery      bunionectomy bil. pins put in and taken out  . Bladder surgery      distenstion numerous times  . Laparoscopy Right 06/13/2013    Procedure: LAPAROSCOPY OPERATIVE WITH OVARIAN CYSTECTOMY convert to open 1329;  Surgeon: Purcell Nails, MD;  Location: [facility];  Service: Gynecology;  Laterality: Right;  . Laparotomy Right 06/13/2013    Procedure: EXPLORATORY LAPAROTOMY, ovarian cyst;  Surgeon: Purcell Nails, MD;  Location: [facility];  Service: Gynecology;  Laterality: Right;  . Intrauterine device insertion  2 yrs ago    Mirena   Family History  Problem  Relation Age of Onset  . Hypertension Mother   . Esophageal cancer Maternal Grandmother   . Hypertension Maternal Grandmother   . Heart disease Maternal Grandmother   . Stomach cancer Maternal Grandmother   . Diabetes Maternal Grandfather   . Stroke Maternal Grandfather   . Depression Maternal Grandfather   . Anxiety disorder Maternal Grandfather   . Hypertension Maternal Grandfather   . Heart disease Maternal Grandfather   . Heart disease Other   . Diabetes Father   . Heart disease Father   . Stroke Father    History  Substance Use Topics  . Smoking status: Former Smoker -- 2.5 years    Types: Cigars    Quit date: 06/06/2001  . Smokeless tobacco: Never Used  . Alcohol Use: Yes     Comment: last drink a few days ago   OB History   Grav Para Term Preterm Abortions TAB SAB Ect Mult Living   0              Review of Systems  All other systems reviewed and are negative.   Allergies  Augmentin; Ciprofloxacin; Erythromycin; Gabapentin; Morphine and related; Other; Prozac; and Pyridium  Home Medications   Prior to Admission medications   Medication Sig Start Date End Date Taking? Authorizing Provider  buPROPion (WELLBUTRIN) 75 MG tablet 75 mg every morning. 04/12/13   Historical Provider, MD  cephALEXin [facility])  500 MG capsule Take 1 capsule (500 mg total) by mouth 4 (four) times daily. X 7 days 10/29/13   Mathis Fare Baleria Wyman, PA  cephALEXin (KEFLEX) 500 MG capsule Take 1 capsule (500 mg total) by mouth 3 (three) times daily. 11/23/13   Reuben Likes, MD  cephALEXin (KEFLEX) 500 MG capsule Take 1 capsule (500 mg total) by mouth 4 (four) times daily. X 7 days 03/17/14   Mathis Fare Rey Fors, PA  diclofenac sodium (VOLTAREN) 1 % GEL Apply 4 g topically 3 (three) times daily as needed (pain).    Historical Provider, MD  doxazosin (CARDURA) 4 MG tablet Take 4 mg by mouth at bedtime as needed and may repeat dose one time if needed (for high blood pressure).     Historical  Provider, MD  HYDROcodone-acetaminophen (NORCO/VICODIN) 5-325 MG per tablet 1 to 2 tabs every 4 to 6 hours as needed for pain. 11/23/13   Reuben Likes, MD  hydrOXYzine (ATARAX/VISTARIL) 25 MG tablet Take 1 tablet (25 mg total) by mouth every 8 (eight) hours as needed (for interstitial cystitis). 11/23/13   Reuben Likes, MD  ibuprofen (ADVIL,MOTRIN) 200 MG tablet Take 200 mg by mouth every 6 (six) hours as needed for mild pain.    Historical Provider, MD  ibuprofen (ADVIL,MOTRIN) 600 MG tablet Take 600 mg by mouth every 6 (six) hours as needed for headache, mild pain or moderate pain.    Historical Provider, MD  levocetirizine (XYZAL) 5 MG tablet Take 5 mg by mouth every evening.    Historical Provider, MD  mupirocin cream (BACTROBAN) 2 % Apply 1 application topically 2 (two) times daily.    Historical Provider, MD  nitrofurantoin, macrocrystal-monohydrate, (MACROBID) 100 MG capsule Take 1 capsule (100 mg total) by mouth 2 (two) times daily. 11/10/13   Linna Hoff, MD  ondansetron (ZOFRAN) 4 MG tablet Take 1 tablet (4 mg total) by mouth every 8 (eight) hours as needed for nausea or vomiting (take only if needed). 11/28/13   York Spaniel, MD  ondansetron (ZOFRAN) 4 MG tablet Take 1 tablet (4 mg total) by mouth every 8 (eight) hours as needed for nausea or vomiting. 03/17/14   Mathis Fare Khaniyah Bezek, PA  oxyCODONE-acetaminophen (PERCOCET/ROXICET) 5-325 MG per tablet Take 1-2 tablets by mouth every 6 (six) hours as needed for moderate pain. 06/14/13   Purcell Nails, MD  rizatriptan (MAXALT-MLT) 10 MG disintegrating tablet Take 1 tablet (10 mg total) by mouth as needed for migraine. May repeat in 2 hours if needed 10/05/13   Nilda Riggs, NP  tolterodine (DETROL LA) 4 MG 24 hr capsule Take 1 capsule (4 mg total) by mouth daily. 11/10/13   Linna Hoff, MD  traMADol (ULTRAM) 50 MG tablet Take 50 mg by mouth every 8 (eight) hours as needed for pain.  10/04/12   Historical Provider, MD  traZODone  (DESYREL) 50 MG tablet Take 150 mg by mouth at bedtime and may repeat dose one time if needed. For depression/sleep 09/05/12   Sanjuana Kava, NP   BP 143/92  Pulse 91  Temp(Src) 98.5 F (36.9 C) (Oral)  Resp 16  SpO2 100%  LMP 09/27/2013 Physical Exam  Nursing note and vitals reviewed. Constitutional: She is oriented to person, place, and time. She appears well-developed and well-nourished. No distress.  HENT:  Head: Normocephalic and atraumatic.  Eyes: Conjunctivae are normal. No scleral icterus.  Cardiovascular: Normal rate, regular rhythm and normal heart sounds.  Pulmonary/Chest: Effort normal and breath sounds normal.  Abdominal: Soft. Bowel sounds are normal. She exhibits no distension. There is no tenderness.  Genitourinary: Pelvic exam was performed with patient supine. There is no rash, tenderness or lesion on the right labia. There is no rash, tenderness or lesion on the left labia. No erythema, tenderness or bleeding around the vagina. No foreign body around the vagina. No signs of injury around the vagina. No vaginal discharge found.  Musculoskeletal: Normal range of motion.  Neurological: She is alert and oriented to person, place, and time.  Skin: Skin is warm and dry.  Psychiatric: She has a normal mood and affect. Her behavior is normal.    ED Course  Procedures (including critical care time) Labs Review Labs Reviewed  POCT URINALYSIS DIP (DEVICE) - Abnormal; Notable for the following:    Glucose, UA 250 (*)    Bilirubin Urine SMALL (*)    Ketones, ur 15 (*)    Protein, ur 100 (*)    Nitrite POSITIVE (*)    Leukocytes, UA LARGE (*)    All other components within normal limits  URINE CULTURE  CERVICOVAGINAL ANCILLARY ONLY    Imaging Review No results found.   MDM   1. UTI (lower urinary tract infection)    UA consistent with persistent UTI. Specimen sent for C&S and will advise discontinuation of Macrobid and advise her to begin taking Cephalexin as  prescribed. Patient also provided with Rx for Zofran as she states all antibiotics make her nauseous. Advise follow up with PCP or ObGyn if no improvement. Will advise by phone if cervicovaginal swabs or urine C&S indicates need for additional or changes in treatment.   Ria Clock, [name] 03/17/14 [phone]

## 2014-03-17 NOTE — ED Notes (Signed)
Pt  Reports  Symptoms  Of  Vaginal itching /  Irritation      Since yesterday

## 2014-03-17 NOTE — ED Notes (Signed)
Patient unable to give urine sample;  Patient given cup of water 

## 2014-03-18 LAB — URINE CULTURE
Colony Count: NO GROWTH
Culture: NO GROWTH
Special Requests: NORMAL

## 2014-03-19 NOTE — ED Provider Notes (Signed)
Medical screening examination/treatment/procedure(s) were performed by a resident physician or non-physician practitioner and as the supervising physician I was immediately available for consultation/collaboration.  Clementeen Graham, MD    Rodolph Bong, MD 03/19/14 [phone]

## 2014-04-04 ENCOUNTER — Ambulatory Visit: Payer: Medicare Other | Attending: Obstetrics and Gynecology | Admitting: Physical Therapy

## 2014-04-04 DIAGNOSIS — IMO0001 Reserved for inherently not codable concepts without codable children: Secondary | ICD-10-CM | POA: Diagnosis present

## 2014-04-04 DIAGNOSIS — M2420 Disorder of ligament, unspecified site: Secondary | ICD-10-CM | POA: Insufficient documentation

## 2014-04-04 DIAGNOSIS — M629 Disorder of muscle, unspecified: Secondary | ICD-10-CM | POA: Diagnosis not present

## 2014-04-13 ENCOUNTER — Other Ambulatory Visit: Payer: Self-pay | Admitting: Nurse Practitioner

## 2014-07-01 ENCOUNTER — Other Ambulatory Visit: Payer: Self-pay | Admitting: Neurology

## 2014-07-04 ENCOUNTER — Ambulatory Visit: Payer: Medicare Other | Attending: Obstetrics and Gynecology | Admitting: Physical Therapy

## 2014-07-04 DIAGNOSIS — R102 Pelvic and perineal pain: Secondary | ICD-10-CM | POA: Insufficient documentation

## 2014-07-04 DIAGNOSIS — M62838 Other muscle spasm: Secondary | ICD-10-CM | POA: Diagnosis not present

## 2014-07-04 DIAGNOSIS — Z90710 Acquired absence of both cervix and uterus: Secondary | ICD-10-CM | POA: Diagnosis not present

## 2014-07-04 DIAGNOSIS — R109 Unspecified abdominal pain: Secondary | ICD-10-CM | POA: Diagnosis present

## 2014-07-04 DIAGNOSIS — R350 Frequency of micturition: Secondary | ICD-10-CM | POA: Insufficient documentation

## 2014-07-04 DIAGNOSIS — M797 Fibromyalgia: Secondary | ICD-10-CM | POA: Diagnosis not present

## 2014-07-18 ENCOUNTER — Ambulatory Visit: Payer: Medicare Other | Admitting: Physical Therapy

## 2014-07-25 ENCOUNTER — Emergency Department (INDEPENDENT_AMBULATORY_CARE_PROVIDER_SITE_OTHER)
Admission: EM | Admit: 2014-07-25 | Discharge: 2014-07-25 | Disposition: A | Discharge status: Home / Self Care | Payer: Medicare Other | Source: Home / Self Care | Attending: Family Medicine | Admitting: Family Medicine

## 2014-07-25 ENCOUNTER — Encounter (HOSPITAL_COMMUNITY): Payer: Self-pay | Admitting: Emergency Medicine

## 2014-07-25 ENCOUNTER — Encounter: Payer: Self-pay | Admitting: Physical Therapy

## 2014-07-25 DIAGNOSIS — H6092 Unspecified otitis externa, left ear: Secondary | ICD-10-CM

## 2014-07-25 MED ORDER — NEOMYCIN-POLYMYXIN-HC 3.5-10000-1 OT SUSP: 4.0000 [drp] | Freq: Three times a day (TID) | OTIC | Status: DC

## 2014-07-25 NOTE — Discharge Instructions (Signed)
Thank you for coming in today. ° °Otitis Externa °Otitis externa is a bacterial or fungal infection of the outer ear canal. This is the area from the eardrum to the outside of the ear. Otitis externa is sometimes called "swimmer's ear." °CAUSES  °Possible causes of infection include: °· Swimming in dirty water. °· Moisture remaining in the ear after swimming or bathing. °· Mild injury (trauma) to the ear. °· Objects stuck in the ear (foreign body). °· Cuts or scrapes (abrasions) on the outside of the ear. °SIGNS AND SYMPTOMS  °The first symptom of infection is often itching in the ear canal. Later signs and symptoms may include swelling and redness of the ear canal, ear pain, and yellowish-white fluid (pus) coming from the ear. The ear pain may be worse when pulling on the earlobe. °DIAGNOSIS  °Your health care provider will perform a physical exam. A sample of fluid may be taken from the ear and examined for bacteria or fungi. °TREATMENT  °Antibiotic ear drops are often given for 10 to 14 days. Treatment may also include pain medicine or corticosteroids to reduce itching and swelling. °HOME CARE INSTRUCTIONS  °· Apply antibiotic ear drops to the ear canal as prescribed by your health care provider. °· Take medicines only as directed by your health care provider. °· If you have diabetes, follow any additional treatment instructions from your health care provider. °· Keep all follow-up visits as directed by your health care provider. °PREVENTION  °· Keep your ear dry. Use the corner of a towel to absorb water out of the ear canal after swimming or bathing. °· Avoid scratching or putting objects inside your ear. This can damage the ear canal or remove the protective wax that lines the canal. This makes it easier for bacteria and fungi to grow. °· Avoid swimming in lakes, polluted water, or poorly chlorinated pools. °· You may use ear drops made of rubbing alcohol and vinegar after swimming. Combine equal parts of  white vinegar and alcohol in a bottle. Put 3 or 4 drops into each ear after swimming. °SEEK MEDICAL CARE IF:  °· You have a fever. °· Your ear is still red, swollen, painful, or draining pus after 3 days. °· Your redness, swelling, or pain gets worse. °· You have a severe headache. °· You have redness, swelling, pain, or tenderness in the area behind your ear. °MAKE SURE YOU:  °· Understand these instructions. °· Will watch your condition. °· Will get help right away if you are not doing well or get worse. °Document Released: 06/28/2005 Document Revised: 11/12/2013 Document Reviewed: 07/15/2011 °[employer]® Patient Information ©2015 [employer]. This information is not intended to replace advice given to you by your health care provider. Make sure you discuss any questions you have with your health care provider. ° °

## 2014-07-25 NOTE — ED Notes (Signed)
C/o ear ache since yesterday.   Pt triaged and assessed by provider.  Provider in before nurse.

## 2014-07-25 NOTE — ED Provider Notes (Signed)
[name] is a 34 y.o. female who presents to Urgent Care today for left ear pain starting today. No fevers or chills nausea vomiting or diarrhea. Patient frequently gets otitis externa which requires alcohol administration. Pain associated with slight decreased hearing. No fevers or chills vomiting or diarrhea.   Past Medical History  Diagnosis Date  . Bipolar 1 disorder   . Migraine   . Mental disorder   . Anxiety   . Interstitial cystitis   . Galactorrhea   . Chronic back pain   . Seasonal allergies   . Sinusitis   . IBS (irritable bowel syndrome)   . IC (interstitial cystitis)   . Insomnia   . GERD (gastroesophageal reflux disease)    Past Surgical History  Procedure Laterality Date  . Tubal ligation    . Bunionectomy    . Interstim implant placement    . Bowel resection  1998    prolapsed bowel  . Foot surgery      bunionectomy bil. pins put in and taken out  . Bladder surgery      distenstion numerous times  . Laparoscopy Right 06/13/2013    Procedure: LAPAROSCOPY OPERATIVE WITH OVARIAN CYSTECTOMY convert to open 1329;  Surgeon: Purcell NailsAngela Y Roberts, MD;  Location: [facility];  Service: Gynecology;  Laterality: Right;  . Laparotomy Right 06/13/2013    Procedure: EXPLORATORY LAPAROTOMY, ovarian cyst;  Surgeon: Purcell NailsAngela Y Roberts, MD;  Location: [facility];  Service: Gynecology;  Laterality: Right;  . Intrauterine device insertion  2 yrs ago    Mirena   History  Substance Use Topics  . Smoking status: Former Smoker -- 2.5 years    Types: Cigars    Quit date: 06/06/2001  . Smokeless tobacco: Never Used  . Alcohol Use: Yes     Comment: last drink a few days ago   ROS as above Medications: No current facility-administered medications for this encounter.   Current Outpatient Prescriptions  Medication Sig Dispense Refill  . buPROPion (WELLBUTRIN) 75 MG tablet 75 mg every morning.    . tolterodine (DETROL LA) 4 MG 24 hr capsule Take 1 capsule (4 mg total) by mouth daily. 30  capsule 1  . cephALEXin (KEFLEX) 500 MG capsule Take 1 capsule (500 mg total) by mouth 4 (four) times daily. X 7 days 28 capsule 0  . cephALEXin (KEFLEX) 500 MG capsule Take 1 capsule (500 mg total) by mouth 3 (three) times daily. 30 capsule 0  . cephALEXin (KEFLEX) 500 MG capsule Take 1 capsule (500 mg total) by mouth 4 (four) times daily. X 7 days 28 capsule 0  . diclofenac sodium (VOLTAREN) 1 % GEL Apply 4 g topically 3 (three) times daily as needed (pain).    Marland Kitchen. doxazosin (CARDURA) 4 MG tablet Take 4 mg by mouth at bedtime as needed and may repeat dose one time if needed (for high blood pressure).     Marland Kitchen. HYDROcodone-acetaminophen (NORCO/VICODIN) 5-325 MG per tablet 1 to 2 tabs every 4 to 6 hours as needed for pain. 20 tablet 0  . hydrOXYzine (ATARAX/VISTARIL) 25 MG tablet Take 1 tablet (25 mg total) by mouth every 8 (eight) hours as needed (for interstitial cystitis). 30 tablet 2  . ibuprofen (ADVIL,MOTRIN) 200 MG tablet Take 200 mg by mouth every 6 (six) hours as needed for mild pain.    Marland Kitchen. ibuprofen (ADVIL,MOTRIN) 600 MG tablet Take 600 mg by mouth every 6 (six) hours as needed for headache, mild pain or moderate pain.    .Marland Kitchen  levocetirizine (XYZAL) 5 MG tablet Take 5 mg by mouth every evening.    . mupirocin cream (BACTROBAN) 2 % Apply 1 application topically 2 (two) times daily.    Marland Kitchen neomycin-polymyxin-hydrocortisone (CORTISPORIN) 3.5-10000-1 otic suspension Place 4 drops into the left ear 3 (three) times daily. 10 mL 0  . nitrofurantoin, macrocrystal-monohydrate, (MACROBID) 100 MG capsule Take 1 capsule (100 mg total) by mouth 2 (two) times daily. 10 capsule 0  . ondansetron (ZOFRAN) 4 MG tablet Take 1 tablet (4 mg total) by mouth every 8 (eight) hours as needed for nausea or vomiting (take only if needed). 30 tablet 1  . ondansetron (ZOFRAN) 4 MG tablet Take 1 tablet (4 mg total) by mouth every 8 (eight) hours as needed for nausea or vomiting. 12 tablet 0  . oxyCODONE-acetaminophen  (PERCOCET/ROXICET) 5-325 MG per tablet Take 1-2 tablets by mouth every 6 (six) hours as needed for moderate pain. 30 tablet 0  . rizatriptan (MAXALT-MLT) 10 MG disintegrating tablet DISSOLVE 1 TABLET IN MOUTH AS NEEDED FOR MIGRAINE, MAY REPEAT IN 2HRS IF NEEDED 10 tablet 0  . traMADol (ULTRAM) 50 MG tablet Take 50 mg by mouth every 8 (eight) hours as needed for pain.     . traZODone (DESYREL) 50 MG tablet Take 150 mg by mouth at bedtime and may repeat dose one time if needed. For depression/sleep    . [DISCONTINUED] chlorproMAZINE (THORAZINE) 25 MG tablet Take 25 mg by mouth 3 (three) times daily.    . [DISCONTINUED] diphenhydrAMINE (BENADRYL) 25 MG tablet Take 25 mg by mouth every 6 (six) hours as needed.     Allergies  Allergen Reactions  . Augmentin [Amoxicillin-Pot Clavulanate] Nausea And Vomiting  . Ciprofloxacin Nausea Only  . Erythromycin Nausea And Vomiting    severe  . Gabapentin Other (See Comments)    SI INTENTIONS  . Morphine And Related Itching  . Other Other (See Comments)    Medication:Urised Reaction:"hurts bladder"  . Prozac [Fluoxetine Hcl]     SI  . Pyridium [Phenazopyridine Hcl]     Increased bladder pain     Exam:  BP 133/81 mmHg  Pulse 84  Temp(Src) 97.5 F (36.4 C) (Oral)  Resp 14  SpO2 96%  LMP 09/27/2013 Gen: Well NAD HEENT: EOMI,  MMM left ear tender to motion. Ear canals erythematous. Right ear is normal appearing. Tympanic membranes are normal appearing bilaterally. Mastoids are nontender bilaterally Lungs: Normal work of breathing. CTABL Heart: RRR no MRG Abd: NABS, Soft. Nondistended, Nontender Exts: Brisk capillary refill, warm and well perfused.   No results found for this or any previous visit (from the past 24 hour(s)). No results found.  Assessment and Plan: 34 y.o. female with otitis externa treat with Cortisporin drops  Discussed warning signs or symptoms. Please see discharge instructions. Patient expresses  understanding.     Rodolph Bong, MD 07/25/14 [phone]

## 2014-08-01 ENCOUNTER — Encounter: Payer: Self-pay | Admitting: Physical Therapy

## 2014-08-04 ENCOUNTER — Other Ambulatory Visit: Payer: Self-pay | Admitting: Neurology

## 2014-08-05 NOTE — Telephone Encounter (Signed)
Patient no showed last appt.  I called, got no answer.  Unable to leave message as mailbox was full.

## 2014-08-06 NOTE — Telephone Encounter (Signed)
I spoke with patient who says she will call back to schedule appt.

## 2014-08-08 ENCOUNTER — Encounter: Payer: Self-pay | Admitting: Physical Therapy

## 2014-08-15 ENCOUNTER — Encounter: Payer: Self-pay | Admitting: Physical Therapy

## 2014-08-20 ENCOUNTER — Emergency Department (HOSPITAL_COMMUNITY)
Admission: EM | Admit: 2014-08-20 | Discharge: 2014-08-21 | Disposition: A | Discharge status: Home / Self Care | Payer: Medicare Other | Attending: Emergency Medicine | Admitting: Emergency Medicine

## 2014-08-20 ENCOUNTER — Encounter (HOSPITAL_COMMUNITY): Payer: Self-pay | Admitting: Emergency Medicine

## 2014-08-20 DIAGNOSIS — K0889 Other specified disorders of teeth and supporting structures: Secondary | ICD-10-CM

## 2014-08-20 DIAGNOSIS — F419 Anxiety disorder, unspecified: Secondary | ICD-10-CM | POA: Diagnosis not present

## 2014-08-20 DIAGNOSIS — Z8719 Personal history of other diseases of the digestive system: Secondary | ICD-10-CM | POA: Insufficient documentation

## 2014-08-20 DIAGNOSIS — G8929 Other chronic pain: Secondary | ICD-10-CM | POA: Diagnosis not present

## 2014-08-20 DIAGNOSIS — Z8709 Personal history of other diseases of the respiratory system: Secondary | ICD-10-CM | POA: Diagnosis not present

## 2014-08-20 DIAGNOSIS — Z79899 Other long term (current) drug therapy: Secondary | ICD-10-CM | POA: Diagnosis not present

## 2014-08-20 DIAGNOSIS — G43909 Migraine, unspecified, not intractable, without status migrainosus: Secondary | ICD-10-CM | POA: Diagnosis not present

## 2014-08-20 DIAGNOSIS — Z87891 Personal history of nicotine dependence: Secondary | ICD-10-CM | POA: Insufficient documentation

## 2014-08-20 DIAGNOSIS — Z791 Long term (current) use of non-steroidal anti-inflammatories (NSAID): Secondary | ICD-10-CM | POA: Diagnosis not present

## 2014-08-20 DIAGNOSIS — K088 Other specified disorders of teeth and supporting structures: Secondary | ICD-10-CM | POA: Diagnosis present

## 2014-08-20 DIAGNOSIS — F319 Bipolar disorder, unspecified: Secondary | ICD-10-CM | POA: Insufficient documentation

## 2014-08-20 DIAGNOSIS — Z792 Long term (current) use of antibiotics: Secondary | ICD-10-CM | POA: Diagnosis not present

## 2014-08-20 MED ORDER — NAPROXEN 500 MG PO TABS: 500.0000 mg | ORAL_TABLET | Freq: Two times a day (BID) | ORAL | Status: DC

## 2014-08-20 MED ORDER — BUPIVACAINE-EPINEPHRINE (PF) 0.5% -1:200000 IJ SOLN
1.8000 mL | Freq: Once | INTRAMUSCULAR | Status: DC
  Filled 2014-08-20: qty 1.8

## 2014-08-20 MED ORDER — CLINDAMYCIN HCL 150 MG PO CAPS: 300.0000 mg | ORAL_CAPSULE | Freq: Three times a day (TID) | ORAL | Status: DC

## 2014-08-20 MED ORDER — HYDROCODONE-ACETAMINOPHEN 5-325 MG PO TABS: 1.0000 | ORAL_TABLET | ORAL | Status: DC | PRN

## 2014-08-20 NOTE — Discharge Instructions (Signed)
Please follow the directions provided. Is very important for you to follow-up with the dentist for further treatment of this tooth pain. Please take the antibiotic as directed until it is all gone. Please take the naproxen twice a day to help with pain. You may take the Vicodin for pain not relieved by the naproxen. The only way this tooth will improve however is treatment from a dentist. Don't hesitate to return for any new, worsening, or concerning symptoms.   SEEK IMMEDIATE MEDICAL CARE IF:  You have a fever.  You develop redness and swelling of your face, jaw, or neck.  You are unable to open your mouth.  You have severe pain uncontrolled by pain medicine.

## 2014-08-20 NOTE — ED Notes (Signed)
Pt from home c/o lower dental pain. She reports visiting her dentist and not getting relief.

## 2014-08-20 NOTE — ED Provider Notes (Signed)
CSN: [id number]     Arrival date & time 08/20/14  2257 History  This chart was scribed for non-physician practitioner, Harle Battiest, NP, working with Tomasita Crumble, MD, by Modena Jansky, ED Scribe. This patient was seen in room WTR5/WTR5 and the patient's care was started at 11:16 PM.    Chief Complaint  Patient presents with  . Dental Pain   The history is provided by the patient. No language interpreter was used.   HPI Comments: [name] is a 34 y.o. female who presents to the Emergency Department complaining of constant moderate left lower dental pain that started about a week ago. She reports that she went to her dentist recently complaining of dental pain possibly by a cavity, but was told it was a stain. She states that she took some tramadol without any relief from the tooth pain.   Dentist- Dr. Edward Jolly   Past Medical History  Diagnosis Date  . Bipolar 1 disorder   . Migraine   . Mental disorder   . Anxiety   . Interstitial cystitis   . Galactorrhea   . Chronic back pain   . Seasonal allergies   . Sinusitis   . IBS (irritable bowel syndrome)   . IC (interstitial cystitis)   . Insomnia   . GERD (gastroesophageal reflux disease)    Past Surgical History  Procedure Laterality Date  . Tubal ligation    . Bunionectomy    . Interstim implant placement    . Bowel resection  1998    prolapsed bowel  . Foot surgery      bunionectomy bil. pins put in and taken out  . Bladder surgery      distenstion numerous times  . Laparoscopy Right 06/13/2013    Procedure: LAPAROSCOPY OPERATIVE WITH OVARIAN CYSTECTOMY convert to open 1329;  Surgeon: Purcell Nails, MD;  Location: [facility];  Service: Gynecology;  Laterality: Right;  . Laparotomy Right 06/13/2013    Procedure: EXPLORATORY LAPAROTOMY, ovarian cyst;  Surgeon: Purcell Nails, MD;  Location: [facility];  Service: Gynecology;  Laterality: Right;  . Intrauterine device insertion  2 yrs ago    Mirena   Family History  Problem  Relation Age of Onset  . Hypertension Mother   . Esophageal cancer Maternal Grandmother   . Hypertension Maternal Grandmother   . Heart disease Maternal Grandmother   . Stomach cancer Maternal Grandmother   . Diabetes Maternal Grandfather   . Stroke Maternal Grandfather   . Depression Maternal Grandfather   . Anxiety disorder Maternal Grandfather   . Hypertension Maternal Grandfather   . Heart disease Maternal Grandfather   . Heart disease Other   . Diabetes Father   . Heart disease Father   . Stroke Father    History  Substance Use Topics  . Smoking status: Former Smoker -- 2.5 years    Types: Cigars    Quit date: 06/06/2001  . Smokeless tobacco: Never Used  . Alcohol Use: Yes     Comment: last drink a few days ago   OB History    Gravida Para Term Preterm AB TAB SAB Ectopic Multiple Living   0              Review of Systems  Constitutional: Negative for fever.  HENT: Positive for dental problem.     Allergies  Augmentin; Ciprofloxacin; Erythromycin; Gabapentin; Morphine and related; Other; Prozac; and Pyridium  Home Medications   Prior to Admission medications   Medication Sig  Start Date End Date Taking? Authorizing Provider  buPROPion (WELLBUTRIN) 75 MG tablet 75 mg every morning. 04/12/13   Historical Provider, MD  cephALEXin (KEFLEX) 500 MG capsule Take 1 capsule (500 mg total) by mouth 4 (four) times daily. X 7 days 10/29/13   Mathis FareJennifer Lee H Presson, PA  cephALEXin (KEFLEX) 500 MG capsule Take 1 capsule (500 mg total) by mouth 3 (three) times daily. 11/23/13   Reuben Likesavid C Keller, MD  cephALEXin (KEFLEX) 500 MG capsule Take 1 capsule (500 mg total) by mouth 4 (four) times daily. X 7 days 03/17/14   Mathis FareJennifer Lee H Presson, PA  diclofenac sodium (VOLTAREN) 1 % GEL Apply 4 g topically 3 (three) times daily as needed (pain).    Historical Provider, MD  doxazosin (CARDURA) 4 MG tablet Take 4 mg by mouth at bedtime as needed and may repeat dose one time if needed (for high blood  pressure).     Historical Provider, MD  HYDROcodone-acetaminophen (NORCO/VICODIN) 5-325 MG per tablet 1 to 2 tabs every 4 to 6 hours as needed for pain. 11/23/13   Reuben Likesavid C Keller, MD  hydrOXYzine (ATARAX/VISTARIL) 25 MG tablet Take 1 tablet (25 mg total) by mouth every 8 (eight) hours as needed (for interstitial cystitis). 11/23/13   Reuben Likesavid C Keller, MD  ibuprofen (ADVIL,MOTRIN) 200 MG tablet Take 200 mg by mouth every 6 (six) hours as needed for mild pain.    Historical Provider, MD  ibuprofen (ADVIL,MOTRIN) 600 MG tablet Take 600 mg by mouth every 6 (six) hours as needed for headache, mild pain or moderate pain.    Historical Provider, MD  levocetirizine (XYZAL) 5 MG tablet Take 5 mg by mouth every evening.    Historical Provider, MD  mupirocin cream (BACTROBAN) 2 % Apply 1 application topically 2 (two) times daily.    Historical Provider, MD  neomycin-polymyxin-hydrocortisone (CORTISPORIN) 3.5-10000-1 otic suspension Place 4 drops into the left ear 3 (three) times daily. 07/25/14   Rodolph BongEvan S Corey, MD  nitrofurantoin, macrocrystal-monohydrate, (MACROBID) 100 MG capsule Take 1 capsule (100 mg total) by mouth 2 (two) times daily. 11/10/13   Linna HoffJames D Kindl, MD  ondansetron (ZOFRAN) 4 MG tablet Take 1 tablet (4 mg total) by mouth every 8 (eight) hours as needed for nausea or vomiting (take only if needed). 11/28/13   York Spanielharles K Willis, MD  ondansetron (ZOFRAN) 4 MG tablet Take 1 tablet (4 mg total) by mouth every 8 (eight) hours as needed for nausea or vomiting. 03/17/14   Mathis FareJennifer Lee H Presson, PA  oxyCODONE-acetaminophen (PERCOCET/ROXICET) 5-325 MG per tablet Take 1-2 tablets by mouth every 6 (six) hours as needed for moderate pain. 06/14/13   Purcell NailsAngela Y Roberts, MD  rizatriptan (MAXALT-MLT) 10 MG disintegrating tablet DISSOLVE 1 TABLET IN MOUTH AS NEEDED FOR MIGRAINE, MAY REPEAT IN 2HRS IF NEEDED 07/01/14   York Spanielharles K Willis, MD  tolterodine (DETROL LA) 4 MG 24 hr capsule Take 1 capsule (4 mg total) by mouth daily.  11/10/13   Linna HoffJames D Kindl, MD  traMADol (ULTRAM) 50 MG tablet Take 50 mg by mouth every 8 (eight) hours as needed for pain.  10/04/12   Historical Provider, MD  traZODone (DESYREL) 50 MG tablet Take 150 mg by mouth at bedtime and may repeat dose one time if needed. For depression/sleep 09/05/12   Sanjuana KavaAgnes I Nwoko, NP   BP 146/59 mmHg  Pulse 84  Temp(Src) 98.1 F (36.7 C) (Oral)  Resp 16  SpO2 97%  LMP 09/27/2013 Physical Exam  Constitutional: She is oriented to person, place, and time. She appears well-developed and well-nourished. No distress.  HENT:  Head: Normocephalic and atraumatic.  4th left lateral incisor that is causing pain. No trismus. No abscess or swelling in the affected area.   Neck: Neck supple. No tracheal deviation present.  Cardiovascular: Normal rate.   Pulmonary/Chest: Effort normal. No respiratory distress.  Musculoskeletal: Normal range of motion.  Neurological: She is alert and oriented to person, place, and time.  Skin: Skin is warm and dry.  Psychiatric: She has a normal mood and affect. Her behavior is normal.  Nursing note and vitals reviewed.   ED Course  Procedures (including critical care time)  NERVE BLOCK Performed by: Harle Battiest Consent: Verbal consent obtained. Required items: required blood products, implants, devices, and special equipment available Time out: Immediately prior to procedure a "time out" was called to verify the correct patient, procedure, equipment, support staff and site/side marked as required.  Indication: dental pain Nerve block body site: left anterior alveolar nerve  Preparation: Patient was prepped and draped in the usual sterile fashion. Needle gauge: 24 G Location technique: anatomical landmarks  Local anesthetic: bupivicaine with epi  Anesthetic total: 1.8 ml  Outcome: pain improved Patient tolerance: Patient tolerated the procedure well with no immediate complications.  DIAGNOSTIC STUDIES: Oxygen  Saturation is 97% on RA, normal by my interpretation.    COORDINATION OF CARE: 11:20 PM- Pt advised of plan for treatment which includes medication and pt agrees.  Labs Review Labs Reviewed - No data to display  Imaging Review No results found.   EKG Interpretation None      MDM   Final diagnoses:  Pain, dental   34 yo with recurrent toothache but no gross abscess or indication for deep space infection or Ludwig's angina. Dental block performed with relief of pain. Prescription for clindamycin and pain medicine provided.  Discussed importance of dental follow-up. Pt aware of plan and in agreement.    I personally performed the services described in this documentation, which was scribed in my presence. The recorded information has been reviewed and is accurate.  Filed Vitals:   08/20/14 2303  BP: 146/59  Pulse: 84  Temp: 98.1 F (36.7 C)  TempSrc: Oral  Resp: 16  SpO2: 97%   Meds given in ED:  Medications - No data to display  Discharge Medication List as of 08/20/2014 11:31 PM    START taking these medications   Details  clindamycin (CLEOCIN) 150 MG capsule Take 2 capsules (300 mg total) by mouth 3 (three) times daily. May dispense as  capsules, Starting 08/20/2014, Until Discontinued, Print    naproxen (NAPROSYN) 500 MG tablet Take 1 tablet (500 mg total) by mouth 2 (two) times daily., Starting 08/20/2014, Until Discontinued, Print       08/20/14 0000  naproxen (NAPROSYN) 500 MG tablet 2 times daily Discontinue Reprint 08/20/14 2329   08/20/14 0000  HYDROcodone-acetaminophen (NORCO/VICODIN) 5-325 MG per tablet Every 4 hours PRN Discontinue Reprint 08/20/14 2329   08/20/14 0000  clindamycin (CLEOCIN) 150 MG capsule 3 times daily Discontinue Reprint 08/20/14 2329         Harle Battiest, NP 08/22/14 1527  Audree Camel, MD 08/23/14 1827

## 2014-08-22 ENCOUNTER — Encounter: Payer: Self-pay | Admitting: Physical Therapy

## 2014-08-29 ENCOUNTER — Encounter: Payer: Self-pay | Admitting: Physical Therapy

## 2014-09-05 ENCOUNTER — Encounter: Payer: Self-pay | Admitting: Physical Therapy

## 2014-11-28 ENCOUNTER — Emergency Department (INDEPENDENT_AMBULATORY_CARE_PROVIDER_SITE_OTHER)
Admission: EM | Admit: 2014-11-28 | Discharge: 2014-11-28 | Disposition: A | Discharge status: Home / Self Care | Payer: Medicare Other | Source: Home / Self Care | Attending: Family Medicine | Admitting: Family Medicine

## 2014-11-28 ENCOUNTER — Encounter (HOSPITAL_COMMUNITY): Payer: Self-pay | Admitting: Emergency Medicine

## 2014-11-28 DIAGNOSIS — M545 Low back pain, unspecified: Secondary | ICD-10-CM

## 2014-11-28 DIAGNOSIS — M5489 Other dorsalgia: Secondary | ICD-10-CM

## 2014-11-28 LAB — POCT URINALYSIS DIP (DEVICE)
Bilirubin Urine: NEGATIVE
Glucose, UA: NEGATIVE mg/dL
Ketones, ur: NEGATIVE mg/dL
Leukocytes, UA: NEGATIVE
Nitrite: NEGATIVE
Protein, ur: NEGATIVE mg/dL
Specific Gravity, Urine: 1.01 (ref 1.005–1.030)
Urobilinogen, UA: 0.2 mg/dL (ref 0.0–1.0)
pH: 6.5 (ref 5.0–8.0)

## 2014-11-28 MED ORDER — DICLOFENAC SODIUM 50 MG PO TBEC: 50.0000 mg | DELAYED_RELEASE_TABLET | Freq: Two times a day (BID) | ORAL | Status: DC | PRN

## 2014-11-28 MED ORDER — KETOROLAC TROMETHAMINE 60 MG/2ML IM SOLN
INTRAMUSCULAR | Status: AC
  Filled 2014-11-28: qty 2

## 2014-11-28 MED ORDER — CYCLOBENZAPRINE HCL 5 MG PO TABS: 5.0000 mg | ORAL_TABLET | Freq: Every evening | ORAL | Status: DC | PRN

## 2014-11-28 MED ORDER — METHYLPREDNISOLONE ACETATE 80 MG/ML IJ SUSP
80.0000 mg | Freq: Once | INTRAMUSCULAR | Status: AC
  Administered 2014-11-28: 80 mg via INTRAMUSCULAR

## 2014-11-28 MED ORDER — METHYLPREDNISOLONE ACETATE 80 MG/ML IJ SUSP
INTRAMUSCULAR | Status: AC
  Filled 2014-11-28: qty 1

## 2014-11-28 MED ORDER — KETOROLAC TROMETHAMINE 60 MG/2ML IM SOLN
60.0000 mg | Freq: Once | INTRAMUSCULAR | Status: AC
  Administered 2014-11-28: 60 mg via INTRAMUSCULAR

## 2014-11-28 NOTE — ED Provider Notes (Addendum)
[name] is a 34 y.o. female who presents to Urgent Care today for back pain. Patient was having some mild abdominal pain this morning. She bent over to [facility] and try to stand up and had severe pain. She notes left-sided low back pain. She is forced to walk October because of pain. She denies any radiating pain weakness or numbness bowel bladder dysfunction or difficulty walking. She does note urinary frequency but denies dysuria or urgency. No fevers or chills vomiting or diarrhea.   Past Medical History  Diagnosis Date  . Bipolar 1 disorder   . Migraine   . Mental disorder   . Anxiety   . Interstitial cystitis   . Galactorrhea   . Chronic back pain   . Seasonal allergies   . Sinusitis   . IBS (irritable bowel syndrome)   . IC (interstitial cystitis)   . Insomnia   . GERD (gastroesophageal reflux disease)    Past Surgical History  Procedure Laterality Date  . Tubal ligation    . Bunionectomy    . Interstim implant placement    . Bowel resection  1998    prolapsed bowel  . Foot surgery      bunionectomy bil. pins put in and taken out  . Bladder surgery      distenstion numerous times  . Laparoscopy Right 06/13/2013    Procedure: LAPAROSCOPY OPERATIVE WITH OVARIAN CYSTECTOMY convert to open 1329;  Surgeon: Purcell NailsAngela Y Roberts, MD;  Location: [facility];  Service: Gynecology;  Laterality: Right;  . Laparotomy Right 06/13/2013    Procedure: EXPLORATORY LAPAROTOMY, ovarian cyst;  Surgeon: Purcell NailsAngela Y Roberts, MD;  Location: [facility];  Service: Gynecology;  Laterality: Right;  . Intrauterine device insertion  2 yrs ago    Mirena   History  Substance Use Topics  . Smoking status: Former Smoker -- 2.5 years    Types: Cigars    Quit date: 06/06/2001  . Smokeless tobacco: Never Used  . Alcohol Use: Yes     Comment: last drink a few days ago   ROS as above Medications: No current facility-administered medications for this encounter.   Current Outpatient Prescriptions   Medication Sig Dispense Refill  . buPROPion (WELLBUTRIN) 75 MG tablet 75 mg every morning.    . cephALEXin (KEFLEX) 500 MG capsule Take 1 capsule (500 mg total) by mouth 4 (four) times daily. X 7 days 28 capsule 0  . cephALEXin (KEFLEX) 500 MG capsule Take 1 capsule (500 mg total) by mouth 3 (three) times daily. 30 capsule 0  . cephALEXin (KEFLEX) 500 MG capsule Take 1 capsule (500 mg total) by mouth 4 (four) times daily. X 7 days 28 capsule 0  . clindamycin (CLEOCIN) 150 MG capsule Take 2 capsules (300 mg total) by mouth 3 (three) times daily. May dispense as 150mg  capsules 60 capsule 0  . diclofenac sodium (VOLTAREN) 1 % GEL Apply 4 g topically 3 (three) times daily as needed (pain).    Marland Kitchen. doxazosin (CARDURA) 4 MG tablet Take 4 mg by mouth at bedtime as needed and may repeat dose one time if needed (for high blood pressure).     Marland Kitchen. HYDROcodone-acetaminophen (NORCO/VICODIN) 5-325 MG per tablet Take 1 tablet by mouth every 4 (four) hours as needed for moderate pain or severe pain. 6 tablet 0  . hydrOXYzine (ATARAX/VISTARIL) 25 MG tablet Take 1 tablet (25 mg total) by mouth every 8 (eight) hours as needed (for interstitial cystitis). 30 tablet 2  . ibuprofen (  ADVIL,MOTRIN) 200 MG tablet Take 200 mg by mouth every 6 (six) hours as needed for mild pain.    Marland Kitchen. ibuprofen (ADVIL,MOTRIN) 600 MG tablet Take 600 mg by mouth every 6 (six) hours as needed for headache, mild pain or moderate pain.    Marland Kitchen. levocetirizine (XYZAL) 5 MG tablet Take 5 mg by mouth every evening.    . mupirocin cream (BACTROBAN) 2 % Apply 1 application topically 2 (two) times daily.    . naproxen (NAPROSYN) 500 MG tablet Take 1 tablet (500 mg total) by mouth 2 (two) times daily. 30 tablet 0  . neomycin-polymyxin-hydrocortisone (CORTISPORIN) 3.5-10000-1 otic suspension Place 4 drops into the left ear 3 (three) times daily. 10 mL 0  . nitrofurantoin, macrocrystal-monohydrate, (MACROBID) 100 MG capsule Take 1 capsule (100 mg total) by mouth 2  (two) times daily. 10 capsule 0  . ondansetron (ZOFRAN) 4 MG tablet Take 1 tablet (4 mg total) by mouth every 8 (eight) hours as needed for nausea or vomiting (take only if needed). 30 tablet 1  . ondansetron (ZOFRAN) 4 MG tablet Take 1 tablet (4 mg total) by mouth every 8 (eight) hours as needed for nausea or vomiting. 12 tablet 0  . oxyCODONE-acetaminophen (PERCOCET/ROXICET) 5-325 MG per tablet Take 1-2 tablets by mouth every 6 (six) hours as needed for moderate pain. 30 tablet 0  . rizatriptan (MAXALT-MLT) 10 MG disintegrating tablet DISSOLVE 1 TABLET IN MOUTH AS NEEDED FOR MIGRAINE, MAY REPEAT IN 2HRS IF NEEDED 10 tablet 0  . tolterodine (DETROL LA) 4 MG 24 hr capsule Take 1 capsule (4 mg total) by mouth daily. 30 capsule 1  . traMADol (ULTRAM) 50 MG tablet Take 50 mg by mouth every 8 (eight) hours as needed for pain.     . traZODone (DESYREL) 50 MG tablet Take 150 mg by mouth at bedtime and may repeat dose one time if needed. For depression/sleep    . [DISCONTINUED] chlorproMAZINE (THORAZINE) 25 MG tablet Take 25 mg by mouth 3 (three) times daily.    . [DISCONTINUED] diphenhydrAMINE (BENADRYL) 25 MG tablet Take 25 mg by mouth every 6 (six) hours as needed.     Allergies  Allergen Reactions  . Augmentin [Amoxicillin-Pot Clavulanate] Nausea And Vomiting  . Ciprofloxacin Nausea Only  . Erythromycin Nausea And Vomiting    severe  . Gabapentin Other (See Comments)    SI INTENTIONS  . Morphine And Related Itching  . Other Other (See Comments)    Medication:Urised Reaction:"hurts bladder"  . Prozac [Fluoxetine Hcl]     SI  . Pyridium [Phenazopyridine Hcl]     Increased bladder pain     Exam:  BP 165/88 mmHg  Pulse 122  Temp(Src) 97.5 F (36.4 C) (Oral)  Resp 16  SpO2 97%  LMP 09/27/2013 Gen: Well NAD in pain appearing HEENT: EOMI,  MMM Lungs: Normal work of breathing. CTABL Heart: Tachycardia no MRG Abd: NABS, Soft. Nondistended, Nontender Exts: Brisk capillary refill, warm  and well perfused.  Back: Nontender to spinal midline. Tender palpation right lumbar paraspinal SI joint. Unable to assess extension. Patient is walk flexed over and has pain with extension. Additionally strength is equal and normal throughout. Sensation is equal and normal throughout. Patient is able to ambulate and climb on and off exam table.  Patient was given 60 mg IM Toradol, and 80 mg IM Depo-Medrol prior to discharge.  Results for orders placed or performed during the hospital encounter of 11/28/14 (from the past 24 hour(s))  POCT urinalysis  dip (device)     Status: Abnormal   Collection Time: 11/28/14  8:22 PM  Result Value Ref Range   Glucose, UA NEGATIVE NEGATIVE mg/dL   Bilirubin Urine NEGATIVE NEGATIVE   Ketones, ur NEGATIVE NEGATIVE mg/dL   Specific Gravity, Urine 1.010 1.005 - 1.030   Hgb urine dipstick TRACE (A) NEGATIVE   pH 6.5 5.0 - 8.0   Protein, ur NEGATIVE NEGATIVE mg/dL   Urobilinogen, UA 0.2 0.0 - 1.0 mg/dL   Nitrite NEGATIVE NEGATIVE   Leukocytes, UA NEGATIVE NEGATIVE   No results found.  Assessment and Plan: 34 y.o. female with lumbosacral strain and spasm. Treat with diclofenac and Flexeril. Follow up with PCP. Return as needed. Elevated heart rate and blood pressure due to pain. Patient's urinary symptoms likely related to underlying interstitial cystitis. Urine culture pending. We'll call and treatment if positive.  Discussed warning signs or symptoms. Please see discharge instructions. Patient expresses understanding.     Rodolph Bong, MD 11/28/14 4098  Rodolph Bong, MD 11/28/14 2046

## 2014-11-28 NOTE — ED Notes (Signed)
Patient c/o lower back pain onset today. Patient reports she cannot walk upright. Patient reports she had a hysterectomy about 1 year ago and is still having side effects. Patient is in NAD.

## 2014-11-28 NOTE — Discharge Instructions (Signed)
Thank you for coming in today. Come back or go to the emergency room if you notice new weakness new numbness problems walking or bowel or bladder problems. Follow up with your doctor.  Go to the emergency room if you get worse.    Lumbosacral Strain Lumbosacral strain is a strain of any of the parts that make up your lumbosacral vertebrae. Your lumbosacral vertebrae are the bones that make up the lower third of your backbone. Your lumbosacral vertebrae are held together by muscles and tough, fibrous tissue (ligaments).  CAUSES  A sudden blow to your back can cause lumbosacral strain. Also, anything that causes an excessive stretch of the muscles in the low back can cause this strain. This is typically seen when people exert themselves strenuously, fall, lift heavy objects, bend, or crouch repeatedly. RISK FACTORS  Physically demanding work.  Participation in pushing or pulling sports or sports that require a sudden twist of the back (tennis, golf, baseball).  Weight lifting.  Excessive lower back curvature.  Forward-tilted pelvis.  Weak back or abdominal muscles or both.  Tight hamstrings. SIGNS AND SYMPTOMS  Lumbosacral strain may cause pain in the area of your injury or pain that moves (radiates) down your leg.  DIAGNOSIS Your health care provider can often diagnose lumbosacral strain through a physical exam. In some cases, you may need tests such as X-ray exams.  TREATMENT  Treatment for your lower back injury depends on many factors that your clinician will have to evaluate. However, most treatment will include the use of anti-inflammatory medicines. HOME CARE INSTRUCTIONS   Avoid hard physical activities (tennis, racquetball, waterskiing) if you are not in proper physical condition for it. This may aggravate or create problems.  If you have a back problem, avoid sports requiring sudden body movements. Swimming and walking are generally safer activities.  Maintain good  posture.  Maintain a healthy weight.  For acute conditions, you may put ice on the injured area.  Put ice in a plastic bag.  Place a towel between your skin and the bag.  Leave the ice on for 20 minutes, 2-3 times a day.  When the low back starts healing, stretching and strengthening exercises may be recommended. SEEK MEDICAL CARE IF:  Your back pain is getting worse.  You experience severe back pain not relieved with medicines. SEEK IMMEDIATE MEDICAL CARE IF:   You have numbness, tingling, weakness, or problems with the use of your arms or legs.  There is a change in bowel or bladder control.  You have increasing pain in any area of the body, including your belly (abdomen).  You notice shortness of breath, dizziness, or feel faint.  You feel sick to your stomach (nauseous), are throwing up (vomiting), or become sweaty.  You notice discoloration of your toes or legs, or your feet get very cold. MAKE SURE YOU:   Understand these instructions.  Will watch your condition.  Will get help right away if you are not doing well or get worse. Document Released: 04/07/2005 Document Revised: 07/03/2013 Document Reviewed: 02/14/2013 [facility] Patient Information 2015 [employer] [place]. This information is not intended to replace advice given to you by your health care provider. Make sure you discuss any questions you have with your health care provider.

## 2014-11-30 LAB — URINE CULTURE
Colony Count: NO GROWTH
Culture: NO GROWTH
Special Requests: NORMAL

## 2015-01-16 ENCOUNTER — Encounter (HOSPITAL_COMMUNITY): Payer: Self-pay | Admitting: Emergency Medicine

## 2015-01-16 ENCOUNTER — Emergency Department (HOSPITAL_COMMUNITY)
Admission: EM | Admit: 2015-01-16 | Discharge: 2015-01-17 | Disposition: A | Discharge status: Home / Self Care | Payer: Medicare Other | Attending: Emergency Medicine | Admitting: Emergency Medicine

## 2015-01-16 ENCOUNTER — Emergency Department (HOSPITAL_COMMUNITY): Payer: Medicare Other

## 2015-01-16 DIAGNOSIS — N3010 Interstitial cystitis (chronic) without hematuria: Secondary | ICD-10-CM | POA: Diagnosis not present

## 2015-01-16 DIAGNOSIS — M545 Low back pain: Secondary | ICD-10-CM | POA: Insufficient documentation

## 2015-01-16 DIAGNOSIS — R0600 Dyspnea, unspecified: Secondary | ICD-10-CM | POA: Diagnosis not present

## 2015-01-16 DIAGNOSIS — Z792 Long term (current) use of antibiotics: Secondary | ICD-10-CM | POA: Insufficient documentation

## 2015-01-16 DIAGNOSIS — F419 Anxiety disorder, unspecified: Secondary | ICD-10-CM | POA: Diagnosis not present

## 2015-01-16 DIAGNOSIS — R519 Headache, unspecified: Secondary | ICD-10-CM

## 2015-01-16 DIAGNOSIS — G8929 Other chronic pain: Secondary | ICD-10-CM | POA: Diagnosis not present

## 2015-01-16 DIAGNOSIS — R51 Headache: Secondary | ICD-10-CM

## 2015-01-16 DIAGNOSIS — Z3202 Encounter for pregnancy test, result negative: Secondary | ICD-10-CM | POA: Insufficient documentation

## 2015-01-16 DIAGNOSIS — Z8709 Personal history of other diseases of the respiratory system: Secondary | ICD-10-CM | POA: Diagnosis not present

## 2015-01-16 DIAGNOSIS — F319 Bipolar disorder, unspecified: Secondary | ICD-10-CM | POA: Insufficient documentation

## 2015-01-16 DIAGNOSIS — Z87891 Personal history of nicotine dependence: Secondary | ICD-10-CM | POA: Diagnosis not present

## 2015-01-16 DIAGNOSIS — Z791 Long term (current) use of non-steroidal anti-inflammatories (NSAID): Secondary | ICD-10-CM | POA: Insufficient documentation

## 2015-01-16 DIAGNOSIS — R110 Nausea: Secondary | ICD-10-CM

## 2015-01-16 DIAGNOSIS — R0789 Other chest pain: Secondary | ICD-10-CM

## 2015-01-16 DIAGNOSIS — E860 Dehydration: Secondary | ICD-10-CM

## 2015-01-16 DIAGNOSIS — G43909 Migraine, unspecified, not intractable, without status migrainosus: Secondary | ICD-10-CM | POA: Insufficient documentation

## 2015-01-16 DIAGNOSIS — R0609 Other forms of dyspnea: Secondary | ICD-10-CM

## 2015-01-16 DIAGNOSIS — Z880 Allergy status to penicillin: Secondary | ICD-10-CM | POA: Insufficient documentation

## 2015-01-16 DIAGNOSIS — Z8719 Personal history of other diseases of the digestive system: Secondary | ICD-10-CM | POA: Insufficient documentation

## 2015-01-16 DIAGNOSIS — R202 Paresthesia of skin: Secondary | ICD-10-CM

## 2015-01-16 DIAGNOSIS — M549 Dorsalgia, unspecified: Secondary | ICD-10-CM

## 2015-01-16 DIAGNOSIS — R42 Dizziness and giddiness: Secondary | ICD-10-CM

## 2015-01-16 LAB — CBC WITH DIFFERENTIAL/PLATELET
Basophils Absolute: 0 10*3/uL (ref 0.0–0.1)
Basophils Relative: 0 % (ref 0–1)
Eosinophils Absolute: 0.4 10*3/uL (ref 0.0–0.7)
Eosinophils Relative: 3 % (ref 0–5)
HCT: 40.3 % (ref 36.0–46.0)
Hemoglobin: 13.7 g/dL (ref 12.0–15.0)
Lymphocytes Relative: 22 % (ref 12–46)
Lymphs Abs: 2.3 10*3/uL (ref 0.7–4.0)
MCH: 30.2 pg (ref 26.0–34.0)
MCHC: 34 g/dL (ref 30.0–36.0)
MCV: 88.8 fL (ref 78.0–100.0)
Monocytes Absolute: 0.6 10*3/uL (ref 0.1–1.0)
Monocytes Relative: 6 % (ref 3–12)
Neutro Abs: 7.3 10*3/uL (ref 1.7–7.7)
Neutrophils Relative %: 69 % (ref 43–77)
Platelets: 269 10*3/uL (ref 150–400)
RBC: 4.54 MIL/uL (ref 3.87–5.11)
RDW: 12.1 % (ref 11.5–15.5)
WBC: 10.7 10*3/uL — ABNORMAL HIGH (ref 4.0–10.5)

## 2015-01-16 LAB — I-STAT TROPONIN, ED: Troponin i, poc: 0 ng/mL (ref 0.00–0.08)

## 2015-01-16 LAB — BASIC METABOLIC PANEL
Anion gap: 8 (ref 5–15)
BUN: 14 mg/dL (ref 6–20)
CO2: 24 mmol/L (ref 22–32)
Calcium: 9.5 mg/dL (ref 8.9–10.3)
Chloride: 105 mmol/L (ref 101–111)
Creatinine, Ser: 0.66 mg/dL (ref 0.44–1.00)
GFR calc Af Amer: >60 mL/min (ref >60)
GFR calc non Af Amer: >60 mL/min (ref >60)
Glucose, Bld: 98 mg/dL (ref 65–99)
Potassium: 3.8 mmol/L (ref 3.5–5.1)
Sodium: 137 mmol/L (ref 135–145)

## 2015-01-16 LAB — D-DIMER, QUANTITATIVE: D-Dimer, Quant: <0.27 ug/mL-FEU (ref 0.00–0.48)

## 2015-01-16 MED ORDER — DIPHENHYDRAMINE HCL 50 MG/ML IJ SOLN
25.0000 mg | Freq: Once | INTRAMUSCULAR | Status: AC
  Administered 2015-01-16: 25 mg via INTRAVENOUS
  Filled 2015-01-16: qty 1

## 2015-01-16 MED ORDER — METOCLOPRAMIDE HCL 5 MG/ML IJ SOLN
10.0000 mg | Freq: Once | INTRAMUSCULAR | Status: AC
  Administered 2015-01-16: 10 mg via INTRAVENOUS
  Filled 2015-01-16: qty 2

## 2015-01-16 MED ORDER — ASPIRIN 81 MG PO CHEW
324.0000 mg | CHEWABLE_TABLET | Freq: Once | ORAL | Status: AC
  Administered 2015-01-16: 324 mg via ORAL
  Filled 2015-01-16: qty 4

## 2015-01-16 MED ORDER — KETOROLAC TROMETHAMINE 30 MG/ML IJ SOLN
30.0000 mg | Freq: Once | INTRAMUSCULAR | Status: AC
  Administered 2015-01-16: 30 mg via INTRAVENOUS
  Filled 2015-01-16: qty 1

## 2015-01-16 MED ORDER — SODIUM CHLORIDE 0.9 % IV BOLUS (SEPSIS)
1000.0000 mL | Freq: Once | INTRAVENOUS | Status: AC
  Administered 2015-01-16: 1000 mL via INTRAVENOUS

## 2015-01-16 NOTE — ED Provider Notes (Signed)
CSN: [id number]     Arrival date & time 01/16/15  2157 History   First MD Initiated Contact with Patient 01/16/15 2205     Chief Complaint  Patient presents with  . Back Pain    Lower back  . Leg Pain    Both legs     (Consider location/radiation/quality/duration/timing/severity/associated sxs/prior Treatment) HPI Comments: Pranathi L Daphine DeutscherMartin is a 34 y.o. female with a PMHx of bipolar 1 disorder, migraines, anxiety, interstitial cystitis, galactorrhea, chronic back pain, IBS, and GERD, with a PSHx of tubal ligation, bladder surgery, ex-lap with ovarian cystectomy, and hysterectomy, who presents to the ED with multiple complaints.  Her primary complaint is central chest pain that has been ongoing for 2 months, 5/10 in severity, burning, nonradiating, worse with stress and exertion, and relieved with ibuprofen. Additionally she reports 2 months of intermittent migraines, back pain, shortness of breath, and tingling in her extremities with intermittent lightheadedness upon standing. She reports that today she felt chest pain and shortness of breath had worsened and therefore she came to the ER. She currently has no shortness of breath, and does not feel lightheaded anymore. She has some ongoing nausea, back pain that "moves around" and is similar to her chronic back pain, and a headache which is intermittent and similar to her prior migraines. She denies any fevers, chills, abdominal pain, vomiting, diarrhea, constipation, melena, hematochezia, dysuria, hematuria, vaginal bleeding or discharge, numbness, weakness, diaphoresis, leg swelling, recent travel/surgery/immobilization, estrogen use, active cancer, smoking, vision changes, orthopnea, or cauda equina symptoms.  Patient is a 34 y.o. female presenting with back pain, leg pain, and chest pain. The history is provided by the patient. No language interpreter was used.  Back Pain Associated symptoms: chest pain, headaches (similar to chronic) and leg pain    Associated symptoms: no abdominal pain, no dysuria, no fever, no numbness and no weakness   Leg Pain Associated symptoms: back pain (chronic)   Associated symptoms: no fever and no neck pain   Chest Pain Pain location:  Substernal area Pain quality: burning   Pain radiates to:  Does not radiate Pain radiates to the back: no   Pain severity:  Mild Onset quality:  Gradual Duration:  2 months Timing:  Constant Progression:  Unchanged Chronicity:  New Context: at rest   Relieved by: ibuprofen. Worsened by:  Exertion (and stress) Ineffective treatments:  None tried Associated symptoms: back pain (chronic), headache (similar to chronic) and shortness of breath (intermittently, now resolved)   Associated symptoms: no abdominal pain, no claudication, no diaphoresis, no dizziness, no fever, no heartburn, no lower extremity edema, no nausea, no numbness, no orthopnea, no PND, not vomiting and no weakness   Risk factors: no birth control, no coronary artery disease, no diabetes mellitus, no high cholesterol, no hypertension, no immobilization, no prior DVT/PE, no smoking and no surgery     Past Medical History  Diagnosis Date  . Bipolar 1 disorder   . Migraine   . Mental disorder   . Anxiety   . Interstitial cystitis   . Galactorrhea   . Chronic back pain   . Seasonal allergies   . Sinusitis   . IBS (irritable bowel syndrome)   . IC (interstitial cystitis)   . Insomnia   . GERD (gastroesophageal reflux disease)    Past Surgical History  Procedure Laterality Date  . Tubal ligation    . Bunionectomy    . Interstim implant placement    . Bowel resection  1998  prolapsed bowel  . Foot surgery      bunionectomy bil. pins put in and taken out  . Bladder surgery      distenstion numerous times  . Laparoscopy Right 06/13/2013    Procedure: LAPAROSCOPY OPERATIVE WITH OVARIAN CYSTECTOMY convert to open 1329;  Surgeon: Purcell Nails, MD;  Location: [facility];  Service: Gynecology;   Laterality: Right;  . Laparotomy Right 06/13/2013    Procedure: EXPLORATORY LAPAROTOMY, ovarian cyst;  Surgeon: Purcell Nails, MD;  Location: [facility];  Service: Gynecology;  Laterality: Right;  . Intrauterine device insertion  2 yrs ago    Mirena   Family History  Problem Relation Age of Onset  . Hypertension Mother   . Esophageal cancer Maternal Grandmother   . Hypertension Maternal Grandmother   . Heart disease Maternal Grandmother   . Stomach cancer Maternal Grandmother   . Diabetes Maternal Grandfather   . Stroke Maternal Grandfather   . Depression Maternal Grandfather   . Anxiety disorder Maternal Grandfather   . Hypertension Maternal Grandfather   . Heart disease Maternal Grandfather   . Heart disease Other   . Diabetes Father   . Heart disease Father   . Stroke Father    History  Substance Use Topics  . Smoking status: Former Smoker -- 2.5 years    Types: Cigars    Quit date: 06/06/2001  . Smokeless tobacco: Never Used  . Alcohol Use: Yes     Comment: last drink a few days ago   OB History    Gravida Para Term Preterm AB TAB SAB Ectopic Multiple Living   0              Review of Systems  Constitutional: Negative for fever, chills and diaphoresis.  Eyes: Negative for visual disturbance.  Respiratory: Positive for shortness of breath (intermittently, now resolved).   Cardiovascular: Positive for chest pain. Negative for orthopnea, claudication, leg swelling and PND.  Gastrointestinal: Negative for heartburn, nausea, vomiting, abdominal pain, diarrhea, constipation and blood in stool.  Genitourinary: Negative for dysuria, hematuria, flank pain, vaginal bleeding and vaginal discharge.  Musculoskeletal: Positive for back pain (chronic). Negative for myalgias, arthralgias, neck pain and neck stiffness.  Skin: Negative for color change.  Allergic/Immunologic: Negative for immunocompromised state.  Neurological: Positive for light-headedness (only with standing) and  headaches (similar to chronic). Negative for dizziness, syncope, weakness and numbness.       +tingling in all extremities  Psychiatric/Behavioral: Negative for confusion.   10 Systems reviewed and are negative for acute change except as noted in the HPI.    Allergies  Augmentin; Ciprofloxacin; Erythromycin; Gabapentin; Morphine and related; Other; Prozac; and Pyridium  Home Medications   Prior to Admission medications   Medication Sig Start Date End Date Taking? Authorizing Provider  buPROPion (WELLBUTRIN) 75 MG tablet 75 mg every morning. 04/12/13   Historical Provider, MD  cyclobenzaprine (FLEXERIL) 5 MG tablet Take 1 tablet (5 mg total) by mouth at bedtime as needed for muscle spasms. 11/28/14   Rodolph Bong, MD  diclofenac (VOLTAREN) 50 MG EC tablet Take 1 tablet (50 mg total) by mouth 2 (two) times daily as needed. 11/28/14   Rodolph Bong, MD  diclofenac sodium (VOLTAREN) 1 % GEL Apply 4 g topically 3 (three) times daily as needed (pain).    Historical Provider, MD  doxazosin (CARDURA) 4 MG tablet Take 4 mg by mouth at bedtime as needed and may repeat dose one time if needed (for high  blood pressure).     Historical Provider, MD  hydrOXYzine (ATARAX/VISTARIL) 25 MG tablet Take 1 tablet (25 mg total) by mouth every 8 (eight) hours as needed (for interstitial cystitis). 11/23/13   Reuben Likes, MD  levocetirizine (XYZAL) 5 MG tablet Take 5 mg by mouth every evening.    Historical Provider, MD  mupirocin cream (BACTROBAN) 2 % Apply 1 application topically 2 (two) times daily.    Historical Provider, MD  naproxen (NAPROSYN) 500 MG tablet Take 1 tablet (500 mg total) by mouth 2 (two) times daily. 08/20/14   Harle Battiest, NP  neomycin-polymyxin-hydrocortisone (CORTISPORIN) 3.5-10000-1 otic suspension Place 4 drops into the left ear 3 (three) times daily. 07/25/14   Rodolph Bong, MD  nitrofurantoin, macrocrystal-monohydrate, (MACROBID) 100 MG capsule Take 1 capsule (100 mg total) by mouth 2  (two) times daily. 11/10/13   Linna Hoff, MD  ondansetron (ZOFRAN) 4 MG tablet Take 1 tablet (4 mg total) by mouth every 8 (eight) hours as needed for nausea or vomiting (take only if needed). 11/28/13   York Spaniel, MD  ondansetron (ZOFRAN) 4 MG tablet Take 1 tablet (4 mg total) by mouth every 8 (eight) hours as needed for nausea or vomiting. 03/17/14   Mathis Fare Presson, PA  rizatriptan (MAXALT-MLT) 10 MG disintegrating tablet DISSOLVE 1 TABLET IN MOUTH AS NEEDED FOR MIGRAINE, MAY REPEAT IN 2HRS IF NEEDED 07/01/14   York Spaniel, MD  tolterodine (DETROL LA) 4 MG 24 hr capsule Take 1 capsule (4 mg total) by mouth daily. 11/10/13   Linna Hoff, MD  traZODone (DESYREL) 50 MG tablet Take 150 mg by mouth at bedtime and may repeat dose one time if needed. For depression/sleep 09/05/12   Sanjuana Kava, NP   BP 135/89 mmHg  Pulse 87  Temp(Src) 97.5 F (36.4 C) (Oral)  Resp 16  SpO2 100%  LMP 09/27/2013 Physical Exam  Constitutional: She is oriented to person, place, and time. Vital signs are normal. She appears well-developed and well-nourished.  Non-toxic appearance. No distress.  Afebrile, nontoxic, NAD  HENT:  Head: Normocephalic and atraumatic.  Mouth/Throat: Oropharynx is clear and moist and mucous membranes are normal.  Eyes: Conjunctivae and EOM are normal. Pupils are equal, round, and reactive to light. Right eye exhibits no discharge. Left eye exhibits no discharge.  PERRL, EOMI, no nystagmus, no visual field deficits   Neck: Normal range of motion. Neck supple. No spinous process tenderness and no muscular tenderness present. No rigidity. Normal range of motion present.  FROM intact without spinous process TTP, no bony stepoffs or deformities, no paraspinous muscle TTP or muscle spasms. No rigidity or meningeal signs. No bruising or swelling.   Cardiovascular: Normal rate, regular rhythm, normal heart sounds and intact distal pulses.  Exam reveals no gallop and no friction rub.    No murmur heard. RRR, nl s1/s2, no m/r/g, distal pulses intact, no pedal edema   Pulmonary/Chest: Effort normal and breath sounds normal. No respiratory distress. She has no decreased breath sounds. She has no wheezes. She has no rhonchi. She has no rales. She exhibits no tenderness, no crepitus, no deformity and no retraction.  CTAB in all lung fields, no w/r/r, no hypoxia or increased WOB, speaking in full sentences, SpO2 100% on RA No chest wall tenderness, crepitus, or deformities  Abdominal: Soft. Normal appearance and bowel sounds are normal. She exhibits no distension. There is no tenderness. There is no rigidity, no rebound, no guarding, no  CVA tenderness, no tenderness at McBurney's point and negative Murphy's sign.  Musculoskeletal: Normal range of motion.  MAE x4 Strength and sensation grossly intact Distal pulses intact No pedal edema, neg homan's bilaterally  Gait steady  Neurological: She is alert and oriented to person, place, and time. She has normal strength. No cranial nerve deficit or sensory deficit. She displays a negative Romberg sign. Coordination and gait normal. GCS eye subscore is 4. GCS verbal subscore is 5. GCS motor subscore is 6.  CN 2-12 grossly intact A&O x4 GCS 15 Sensation and strength intact Gait nonataxic Coordination with finger-to-nose WNL Neg romberg, neg pronator drift   Skin: Skin is warm, dry and intact. No rash noted.  Psychiatric: She has a normal mood and affect.  Nursing note and vitals reviewed.   ED Course  Procedures (including critical care time)  00:00 Orthostatic Vital Signs CS  Orthostatic Lying  - BP- Lying: 145/88 mmHg ; Pulse- Lying: 106  Orthostatic Sitting - BP- Sitting: 128/84 mmHg ; Pulse- Sitting: 97  Orthostatic Standing at 0 minutes - BP- Standing at 0 minutes: 124/90 mmHg ; Pulse- Standing at 0 minutes: 103      Labs Review Labs Reviewed  CBC WITH DIFFERENTIAL/PLATELET - Abnormal; Notable for the following:     WBC 10.7 (*)    All other components within normal limits  URINALYSIS, ROUTINE W REFLEX MICROSCOPIC (NOT AT [facility]) - Abnormal; Notable for the following:    Ketones, ur 15 (*)    All other components within normal limits  BASIC METABOLIC PANEL  D-DIMER, QUANTITATIVE (NOT AT [facility])  Rosezena Sensor, ED  POC URINE PREG, ED    Imaging Review Dg Chest 2 View  01/16/2015   CLINICAL DATA:  Acute onset of generalized chest pain, radiating down the back and both legs. Initial encounter.  EXAM: CHEST  2 VIEW  COMPARISON:  Chest radiograph performed 08/30/2012  FINDINGS: The lungs are well-aerated and clear. There is no evidence of focal opacification, pleural effusion or pneumothorax.  The heart is normal in size; the mediastinal contour is within normal limits. No acute osseous abnormalities are seen.  IMPRESSION: No acute cardiopulmonary process seen.   Electronically Signed   By: Roanna Raider M.D.   On: 01/16/2015 23:02     EKG Interpretation None      MDM   Final diagnoses:  Orthostatic lightheadedness  Atypical chest pain  Chronic nonintractable headache, unspecified headache type  Chronic back pain  DOE (dyspnea on exertion)  Nausea  Paresthesias  Dehydration    34 y.o. female here with multiple complaints, most have been ongoing for months. Her primary concern is with constant CP and intermittent SOB x2 months, which occurred again today, and she decided to seek medical attention. States it's mostly with exertion. Also complains of ongoing headaches which are similar to her migraines but not quite as strong, tingling in all of her extremities, chronic back pain, and intermittent lightheadedness with standing. Neuro exam benign and nonfocal, no midline spinal tenderness, ambulatory with steady gait, vital signs WNL, no hypoxia or tachycardia, no pedal edema. Will give migraine cocktail, and get labs, U/A, upreg, dimer, CXR, and orthostatic VS. Very difficult historian since she goes  back and forth between symptoms and many are chronic. Doubt need for CT head or LP, doubt need for MRI given nonfocal neuro exam. Doubt need for back imaging given no focal tenderness on exam and no red flag s/sx. Will reassess shortly.   12:23 AM  Orthostatic VS showing orthostasis. Upreg neg. U/A pending. Trop neg. BMP and CBC unremarkable. D-dimer neg. CXR clear. EKG with some Q waves but no inverted T waves or ST/T changes. All symptoms currently resolved, pt feels well, no ongoing CP/SOB/lightheadedness/HA. Will await u/a then d/c home. Symptoms all likely from dehydration.  12:44 AM U/A clear aside from ketones, which further confirm possibly dehydration being the etiology of her multitude of symptoms. No ongoing symptoms. Tolerating PO well. Will have her f/up with PCP in 1wk for recheck of symptoms, will send home with reglan for HA/nausea. Discussed tylenol/motrin. Discussed need for hydration with water. Also gave pt neurology f/up, but discussed that she should first f/up with PCP and if symptoms resolved after better hydration and reglan PCN, doubt she would need ongoing neuro eval. I explained the diagnosis and have given explicit precautions to return to the ER including for any other new or worsening symptoms. The patient understands and accepts the medical plan as it's been dictated and I have answered their questions. Discharge instructions concerning home care and prescriptions have been given. The patient is STABLE and is discharged to home in good condition.  BP 135/89 mmHg  Pulse 87  Temp(Src) 97.5 F (36.4 C) (Oral)  Resp 16  SpO2 100%  LMP 09/27/2013  Meds ordered this encounter  Medications  . aspirin chewable tablet 324 mg    Sig:   . metoCLOPramide (REGLAN) injection 10 mg    Sig:    And  . diphenhydrAMINE (BENADRYL) injection 25 mg    Sig:    And  . sodium chloride 0.9 % bolus 1,000 mL    Sig:    And  . ketorolac (TORADOL) 30 MG/ML injection 30 mg    Sig:   .  metoCLOPramide (REGLAN) 10 MG tablet    Sig: Take 1 tablet (10 mg total) by mouth every 6 (six) hours as needed for nausea (nausea/headache).    Dispense:  6 tablet    Refill:  0     Tyeler Goedken Camprubi-Soms, PA-C 01/17/15 0046  Arby Barrette, MD 01/20/15 [phone]

## 2015-01-16 NOTE — ED Notes (Signed)
Patient is a having back pain and the pain is going into her legs

## 2015-01-16 NOTE — ED Notes (Signed)
[facility] Expected date:  Expected time:  Means of arrival:  Comments: EMS/59F/muscles cramps/chronic back pain

## 2015-01-17 DIAGNOSIS — M545 Low back pain: Secondary | ICD-10-CM | POA: Diagnosis not present

## 2015-01-17 LAB — URINALYSIS, ROUTINE W REFLEX MICROSCOPIC
Bilirubin Urine: NEGATIVE
Glucose, UA: NEGATIVE mg/dL
Hgb urine dipstick: NEGATIVE
Ketones, ur: 15 mg/dL — AB
Leukocytes, UA: NEGATIVE
Nitrite: NEGATIVE
Protein, ur: NEGATIVE mg/dL
Specific Gravity, Urine: 1.019 (ref 1.005–1.030)
Urobilinogen, UA: 0.2 mg/dL (ref 0.0–1.0)
pH: 6 (ref 5.0–8.0)

## 2015-01-17 LAB — POC URINE PREG, ED: Preg Test, Ur: NEGATIVE

## 2015-01-17 MED ORDER — METOCLOPRAMIDE HCL 10 MG PO TABS: 10.0000 mg | ORAL_TABLET | Freq: Four times a day (QID) | ORAL | Status: DC | PRN

## 2015-01-17 NOTE — ED Notes (Signed)
EKG given to EDP,Otter,MD., for review. 

## 2015-01-17 NOTE — Discharge Instructions (Signed)
Your symptoms could be related to being dehydrated. It is important to stay very well hydrated. Use tylenol or motrin as needed for pain, and reglan as needed for headaches and nausea. Your chronic symptoms will need to be further evaluated by your regular doctor, schedule an appointment for follow up in the next one week. You may also need to see the neurologist for ongoing evaluation of your chronic headaches and odd sensations in your extremities. Return to the ER for changes or worsening symptoms.   Chest Pain (Nonspecific) It is often hard to give a diagnosis for the cause of chest pain. There is always a chance that your pain could be related to something serious, such as a heart attack or a blood clot in the lungs. You need to follow up with your doctor. HOME CARE  If antibiotic medicine was given, take it as directed by your doctor. Finish the medicine even if you start to feel better.  For the next few days, avoid activities that bring on chest pain. Continue physical activities as told by your doctor.  Do not use any tobacco products. This includes cigarettes, chewing tobacco, and e-cigarettes.  Avoid drinking alcohol.  Only take medicine as told by your doctor.  Follow your doctor's suggestions for more testing if your chest pain does not go away.  Keep all doctor visits you made. GET HELP IF:  Your chest pain does not go away, even after treatment.  You have a rash with blisters on your chest.  You have a fever. GET HELP RIGHT AWAY IF:   You have more pain or pain that spreads to your arm, neck, jaw, back, or belly (abdomen).  You have shortness of breath.  You cough more than usual or cough up blood.  You have very bad back or belly pain.  You feel sick to your stomach (nauseous) or throw up (vomit).  You have very bad weakness.  You pass out (faint).  You have chills. This is an emergency. Do not wait to see if the problems will go away. Call your local  emergency services (911 in [place] Do not drive yourself to the hospital. MAKE SURE YOU:   Understand these instructions.  Will watch your condition.  Will get help right away if you are not doing well or get worse. Document Released: 12/15/2007 Document Revised: 07/03/2013 Document Reviewed: 12/15/2007 [facility] Patient Information 2015 [place], [place]. This information is not intended to replace advice given to you by your health care provider. Make sure you discuss any questions you have with your health care provider.  Dehydration, Adult Dehydration means your body does not have as much fluid as it needs. Your kidneys, brain, and heart will not work properly without the right amount of fluids and salt.  HOME CARE  Ask your doctor how to replace body fluid losses (rehydrate).  Drink enough fluids to keep your pee (urine) clear or pale yellow.  Drink small amounts of fluids often if you feel sick to your stomach (nauseous) or throw up (vomit).  Eat like you normally do.  Avoid:  Foods or drinks high in sugar.  Bubbly (carbonated) drinks.  Juice.  Very hot or cold fluids.  Drinks with caffeine.  Fatty, greasy foods.  Alcohol.  Tobacco.  Eating too much.  Gelatin desserts.  Wash your hands to avoid spreading germs (bacteria, viruses).  Only take medicine as told by your doctor.  Keep all doctor visits as told. GET HELP RIGHT AWAY IF:  You cannot drink something without throwing up.  You get worse even with treatment.  Your vomit has blood in it or looks greenish.  Your poop (stool) has blood in it or looks black and tarry.  You have not peed in 6 to 8 hours.  You pee a small amount of very dark pee.  You have a fever.  You pass out (faint).  You have belly (abdominal) pain that gets worse or stays in one spot (localizes).  You have a rash, stiff neck, or bad headache.  You get easily annoyed, sleepy, or are hard to wake up.  You feel weak,  dizzy, or very thirsty. MAKE SURE YOU:   Understand these instructions.  Will watch your condition.  Will get help right away if you are not doing well or get worse. Document Released: 04/24/2009 Document Revised: 09/20/2011 Document Reviewed: 02/15/2011 [facility] Patient Information 2015 [place], [place]. This information is not intended to replace advice given to you by your health care provider. Make sure you discuss any questions you have with your health care provider.  Migraine Headache A migraine headache is very bad, throbbing pain on one or both sides of your head. Talk to your doctor about what things may bring on (trigger) your migraine headaches. HOME CARE  Only take medicines as told by your doctor.  Lie down in a dark, quiet room when you have a migraine.  Keep a journal to find out if certain things bring on migraine headaches. For example, write down:  What you eat and drink.  How much sleep you get.  Any change to your diet or medicines.  Lessen how much alcohol you drink.  Quit smoking if you smoke.  Get enough sleep.  Lessen any stress in your life.  Keep lights dim if bright lights bother you or make your migraines worse. GET HELP RIGHT AWAY IF:   Your migraine becomes really bad.  You have a fever.  You have a stiff neck.  You have trouble seeing.  Your muscles are weak, or you lose muscle control.  You lose your balance or have trouble walking.  You feel like you will pass out (faint), or you pass out.  You have really bad symptoms that are different than your first symptoms. MAKE SURE YOU:   Understand these instructions.  Will watch your condition.  Will get help right away if you are not doing well or get worse. Document Released: 04/06/2008 Document Revised: 09/20/2011 Document Reviewed: 03/05/2013 [facility] Patient Information 2015 [place], [place]. This information is not intended to replace advice given to you by your health care  provider. Make sure you discuss any questions you have with your health care provider.  Nausea, Adult Nausea means you feel sick to your stomach or need to throw up (vomit). It may be a sign of a more serious problem. If nausea gets worse, you may throw up. If you throw up a lot, you may lose too much body fluid (dehydration). HOME CARE   Get plenty of rest.  Ask your doctor how to replace body fluid losses (rehydrate).  Eat small amounts of food. Sip liquids more often.  Take all medicines as told by your doctor. GET HELP RIGHT AWAY IF:  You have a fever.  You pass out (faint).  You keep throwing up or have blood in your throw up.  You are very weak, have dry lips or a dry mouth, or you are very thirsty (dehydrated).  You have dark  or bloody poop (stool).  You have very bad chest or belly (abdominal) pain.  You do not get better after 2 days, or you get worse.  You have a headache. MAKE SURE YOU:  Understand these instructions.  Will watch your condition.  Will get help right away if you are not doing well or get worse. Document Released: 06/17/2011 Document Revised: 09/20/2011 Document Reviewed: 06/17/2011 [facility] Patient Information 2015 [place], [place]. This information is not intended to replace advice given to you by your health care provider. Make sure you discuss any questions you have with your health care provider.  Orthostatic Hypotension Orthostatic hypotension is a sudden drop in blood pressure. It happens when you quickly stand up from a seated or lying position. You may feel dizzy or light-headed. This can last for just a few seconds or for up to a few minutes. It is usually not a serious problem. However, if this happens frequently or gets worse, it can be a sign of something more serious. CAUSES  Different things can cause orthostatic hypotension, including:   Loss of body fluids (dehydration).  Medicines that lower blood pressure.  Sudden changes in  posture, such as standing up quickly after you have been sitting or lying down.  Taking too much of your medicine. SIGNS AND SYMPTOMS   Light-headedness or dizziness.   Fainting or near-fainting.   A fast heart rate.   Weakness.   Feeling tired (fatigue).  DIAGNOSIS  Your health care provider may do several things to help diagnose your condition and identify the cause. These may include:   Taking a medical history and doing a physical exam.  Checking your blood pressure. Your health care provider will check your blood pressure when you are:  Lying down.  Sitting.  Standing.  Using tilt table testing. In this test, you lie down on a table that moves from a lying position to a standing position. You will be strapped onto the table. This test monitors your blood pressure and heart rate when you are in different positions. TREATMENT  Treatment will vary depending on the cause. Possible treatments include:   Changing the dosage of your medicines.  Wearing compression stockings on your lower legs.  Standing up slowly after sitting or lying down.  Eating more salt.  Eating frequent, small meals.  In some cases, getting IV fluids.  Taking medicine to enhance fluid retention. HOME CARE INSTRUCTIONS  Only take over-the-counter or prescription medicines as directed by your health care provider.  Follow your health care provider's instructions for changing the dosage of your current medicines.  Do not stop or adjust your medicine on your own.  Stand up slowly after sitting or lying down. This allows your body to adjust to the different position.  Wear compression stockings as directed.  Eat extra salt as directed.  Do not add extra salt to your diet unless directed to by your health care provider.  Eat frequent, small meals.  Avoid standing suddenly after eating.  Avoid hot showers or excessive heat as directed by your health care provider.  Keep all  follow-up appointments. SEEK MEDICAL CARE IF:  You continue to feel dizzy or light-headed after standing.  You feel groggy or confused.  You feel cold, clammy, or sick to your stomach (nauseous).  You have blurred vision.  You feel short of breath. SEEK IMMEDIATE MEDICAL CARE IF:   You faint after standing.  You have chest pain.  You have difficulty breathing.   You  lose feeling or movement in your arms or legs.   You have slurred speech or difficulty talking, or you are unable to talk.  MAKE SURE YOU:   Understand these instructions.  Will watch your condition.  Will get help right away if you are not doing well or get worse. Document Released: 06/18/2002 Document Revised: 07/03/2013 Document Reviewed: 04/20/2013 [facility] Patient Information 2015 [place], [place]. This information is not intended to replace advice given to you by your health care provider. Make sure you discuss any questions you have with your health care provider.  Paresthesia Paresthesia is a burning or prickling feeling. This feeling can happen in any part of the body. It often happens in the hands, arms, legs, or feet. HOME CARE  Avoid drinking alcohol.  Try massage or needle therapy (acupuncture) to help with your problems.  Keep all doctor visits as told. GET HELP RIGHT AWAY IF:   You feel weak.  You have trouble walking or moving.  You have problems speaking or seeing.  You feel confused.  You cannot control when you poop (bowel movement) or pee (urinate).  You lose feeling (numbness) after an injury.  You pass out (faint).  Your burning or prickling feeling gets worse when you walk.  You have pain, cramps, or feel dizzy.  You have a rash. MAKE SURE YOU:   Understand these instructions.  Will watch your condition.  Will get help right away if you are not doing well or get worse. Document Released: 06/10/2008 Document Revised: 09/20/2011 Document Reviewed:  03/19/2011 [facility] Patient Information 2015 [place], [place]. This information is not intended to replace advice given to you by your health care provider. Make sure you discuss any questions you have with your health care provider.

## 2015-06-03 ENCOUNTER — Encounter: Payer: Self-pay | Admitting: Internal Medicine

## 2015-06-21 ENCOUNTER — Emergency Department (INDEPENDENT_AMBULATORY_CARE_PROVIDER_SITE_OTHER)
Admission: EM | Admit: 2015-06-21 | Discharge: 2015-06-21 | Disposition: A | Discharge status: Home / Self Care | Payer: Medicare Other | Source: Home / Self Care | Attending: Family Medicine | Admitting: Family Medicine

## 2015-06-21 ENCOUNTER — Encounter (HOSPITAL_COMMUNITY): Payer: Self-pay | Admitting: Emergency Medicine

## 2015-06-21 DIAGNOSIS — R21 Rash and other nonspecific skin eruption: Secondary | ICD-10-CM

## 2015-06-21 DIAGNOSIS — R1032 Left lower quadrant pain: Secondary | ICD-10-CM | POA: Diagnosis not present

## 2015-06-21 DIAGNOSIS — N83202 Unspecified ovarian cyst, left side: Secondary | ICD-10-CM

## 2015-06-21 LAB — POCT URINALYSIS DIP (DEVICE)
Bilirubin Urine: NEGATIVE
Glucose, UA: NEGATIVE mg/dL
Hgb urine dipstick: NEGATIVE
Ketones, ur: NEGATIVE mg/dL
Leukocytes, UA: NEGATIVE
Nitrite: NEGATIVE
Protein, ur: NEGATIVE mg/dL
Specific Gravity, Urine: 1.015 (ref 1.005–1.030)
Urobilinogen, UA: 0.2 mg/dL (ref 0.0–1.0)
pH: 8 (ref 5.0–8.0)

## 2015-06-21 MED ORDER — NAPROXEN 500 MG PO TABS: 500.0000 mg | ORAL_TABLET | Freq: Two times a day (BID) | ORAL | Status: DC

## 2015-06-21 NOTE — Discharge Instructions (Signed)
°  It was nice seeing you today. I am sorry you are having pain. I have given naproxyn for pain. Please see your OB/GYN soon.  Ovarian Cyst An ovarian cyst is a sac filled with fluid or blood. This sac is attached to the ovary. Some cysts go away on their own. Other cysts need treatment.  HOME CARE   Only take medicine as told by your doctor.  Follow up with your doctor as told.  Get regular pelvic exams and Pap tests. GET HELP IF:  Your periods are late, not regular, or painful.  You stop having periods.  Your belly (abdominal) or pelvic pain does not go away.  Your belly becomes large or puffy (swollen).  You have a hard time peeing (totally emptying your bladder).  You have pressure on your bladder.  You have pain during sex.  You feel fullness, pressure, or discomfort in your belly.  You lose weight for no reason.  You feel sick most of the time.  You have a hard time pooping (constipation).  You do not feel like eating.  You develop pimples (acne).  You have an increase in hair on your body and face.  You are gaining weight for no reason.  You think you are pregnant. GET HELP RIGHT AWAY IF:   Your belly pain gets worse.  You feel sick to your stomach (nauseous), and you throw up (vomit).  You have a fever that comes on fast.  You have belly pain while pooping (bowel movement).  Your periods are heavier than usual. MAKE SURE YOU:   Understand these instructions.  Will watch your condition.  Will get help right away if you are not doing well or get worse.   This information is not intended to replace advice given to you by your health care provider. Make sure you discuss any questions you have with your health care provider.   Document Released: 12/15/2007 Document Revised: 04/18/2013 Document Reviewed: 03/05/2013 Elsevier Interactive Patient Education [employer]2016 [employer] Inc.

## 2015-06-21 NOTE — ED Provider Notes (Signed)
CSN: [id number]     Arrival date & time 06/21/15  1331 History   First MD Initiated Contact with Patient 06/21/15 1503     Chief Complaint  Patient presents with  . Pelvic Pain   (Consider location/radiation/quality/duration/timing/severity/associated sxs/prior Treatment) Patient is a 34 y.o. female presenting with abdominal pain and rash. The history is provided by the patient. No language interpreter was used.  Abdominal Pain Pain location:  LLQ (Hx of large left oavrian cyst recent U[place] it per patient) Pain quality: aching, fullness and heavy   Pain radiates to:  Does not radiate Pain severity:  Moderate Onset quality:  Gradual Duration: She has had this for months but worsened today. Timing:  Intermittent Progression:  Waxing and waning Chronicity:  Recurrent Context: not diet changes, not laxative use and not trauma   Context comment:  Hx of hysterectomy with left ovary retention. recently diagnosed with large left ovarian mass with surgery scheduled in Jan. Relieved by:  Lying down Worsened by:  Movement Associated symptoms: no anorexia, no constipation, no diarrhea, no fatigue, no fever, no hematuria, no nausea and no vaginal discharge   Risk factors: no recent hospitalization   Rash Location: butt. Quality: not dry, not itchy and not red   Quality comment:  She is uncertain what rash looks like. At times she feels some roughness or bumps on her butt. She can not specify the location on her butt Severity:  Mild Onset quality:  Gradual Duration:  2 weeks Timing:  Intermittent Chronicity:  New Context: not exposure to similar rash and not sick contacts   Associated symptoms: abdominal pain   Associated symptoms: no diarrhea, no fatigue, no fever and no nausea       Past Medical History  Diagnosis Date  . Bipolar 1 disorder (HCC)   . Migraine   . Mental disorder   . Anxiety   . Interstitial cystitis   . Galactorrhea   . Chronic back  pain   . Seasonal allergies   . Sinusitis   . IBS (irritable bowel syndrome)   . IC (interstitial cystitis)   . Insomnia   . GERD (gastroesophageal reflux disease)    Past Surgical History  Procedure Laterality Date  . Tubal ligation    . Bunionectomy    . Interstim implant placement    . Bowel resection  1998    prolapsed bowel  . Foot surgery      bunionectomy bil. pins put in and taken out  . Bladder surgery      distenstion numerous times  . Laparoscopy Right 06/13/2013    Procedure: LAPAROSCOPY OPERATIVE WITH OVARIAN CYSTECTOMY convert to open 1329;  Surgeon: Purcell Nails, MD;  Location: [facility];  Service: Gynecology;  Laterality: Right;  . Laparotomy Right 06/13/2013    Procedure: EXPLORATORY LAPAROTOMY, ovarian cyst;  Surgeon: Purcell Nails, MD;  Location: [facility];  Service: Gynecology;  Laterality: Right;  . Intrauterine device insertion  2 yrs ago    Mirena   Family History  Problem Relation Age of Onset  . Hypertension Mother   . Esophageal cancer Maternal Grandmother   . Hypertension Maternal Grandmother   . Heart disease Maternal Grandmother   . Stomach cancer Maternal Grandmother   . Diabetes Maternal Grandfather   . Stroke Maternal Grandfather   . Depression Maternal Grandfather   . Anxiety disorder Maternal Grandfather   . Hypertension Maternal Grandfather   . Heart disease Maternal Grandfather   .  Heart disease Other   . Diabetes Father   . Heart disease Father   . Stroke Father    Social History  Substance Use Topics  . Smoking status: Former Smoker -- 2.5 years    Types: Cigars    Quit date: 06/06/2001  . Smokeless tobacco: Never Used  . Alcohol Use: Yes     Comment: last drink a few days ago   OB History    Gravida Para Term Preterm AB TAB SAB Ectopic Multiple Living   0              Review of Systems  Constitutional: Negative for fever and fatigue.  Respiratory: Negative.   Cardiovascular: Negative.   Gastrointestinal: Positive for  abdominal pain. Negative for nausea, diarrhea, constipation and anorexia.  Genitourinary: Negative.  Negative for hematuria and vaginal discharge.  Skin: Positive for rash.    Allergies  Hydrocodone; Augmentin; Ciprofloxacin; Erythromycin; Gabapentin; Morphine and related; Other; Prozac; and Pyridium  Home Medications   Prior to Admission medications   Medication Sig Start Date End Date Taking? Authorizing Provider  estropipate (OGEN) 0.75 MG tablet Take 0.75 mg by mouth daily.   Yes Historical Provider, MD  levocetirizine (XYZAL) 5 MG tablet Take 5 mg by mouth every evening.   Yes Historical Provider, MD  buPROPion (WELLBUTRIN) 100 MG tablet Take 200 mg by mouth every morning. 12/23/14   Historical Provider, MD  cyclobenzaprine (FLEXERIL) 5 MG tablet Take 1 tablet (5 mg total) by mouth at bedtime as needed for muscle spasms. 11/28/14   Rodolph Bong, MD  diclofenac (VOLTAREN) 50 MG EC tablet Take 1 tablet (50 mg total) by mouth 2 (two) times daily as needed. Patient not taking: Reported on 01/16/2015 11/28/14   Rodolph Bong, MD  hydrOXYzine (ATARAX/VISTARIL) 25 MG tablet Take 1 tablet (25 mg total) by mouth every 8 (eight) hours as needed (for interstitial cystitis). Patient not taking: Reported on 01/16/2015 11/23/13   Reuben Likes, MD  ibuprofen (ADVIL,MOTRIN) 600 MG tablet Take 600 mg by mouth every 6 (six) hours as needed. For pain. 12/17/14   Historical Provider, MD  metoCLOPramide (REGLAN) 10 MG tablet Take 1 tablet (10 mg total) by mouth every 6 (six) hours as needed for nausea (nausea/headache). 01/17/15   Mercedes Camprubi-Soms, PA-C  naproxen (NAPROSYN) 500 MG tablet Take 1 tablet (500 mg total) by mouth 2 (two) times daily. Patient not taking: Reported on 01/16/2015 08/20/14   Harle Battiest, NP  neomycin-polymyxin-hydrocortisone (CORTISPORIN) 3.5-10000-1 otic suspension Place 4 drops into the left ear 3 (three) times daily. Patient not taking: Reported on 01/16/2015 07/25/14   Rodolph Bong,  MD  nitrofurantoin, macrocrystal-monohydrate, (MACROBID) 100 MG capsule Take 1 capsule (100 mg total) by mouth 2 (two) times daily. Patient not taking: Reported on 01/16/2015 11/10/13   Linna Hoff, MD  ondansetron (ZOFRAN) 4 MG tablet Take 1 tablet (4 mg total) by mouth every 8 (eight) hours as needed for nausea or vomiting (take only if needed). Patient not taking: Reported on 01/16/2015 11/28/13   York Spaniel, MD  ondansetron (ZOFRAN) 4 MG tablet Take 1 tablet (4 mg total) by mouth every 8 (eight) hours as needed for nausea or vomiting. Patient not taking: Reported on 01/16/2015 03/17/14   Mathis Fare Presson, PA  rizatriptan (MAXALT-MLT) 10 MG disintegrating tablet DISSOLVE 1 TABLET IN MOUTH AS NEEDED FOR MIGRAINE, MAY REPEAT IN 2HRS IF NEEDED Patient not taking: Reported on 01/16/2015 07/01/14   Hennie Duos  Anne HahnWillis, MD  sertraline (ZOLOFT) 50 MG tablet Take 50 mg by mouth daily.    Historical Provider, MD  tolterodine (DETROL LA) 4 MG 24 hr capsule Take 1 capsule (4 mg total) by mouth daily. Patient not taking: Reported on 01/16/2015 11/10/13   Linna HoffJames D Kindl, MD   Meds Ordered and Administered this Visit  Medications - No data to display  BP 146/98 mmHg  Pulse 99  Temp(Src) 98.7 F (37.1 C) (Oral)  Resp 16  SpO2 99%  LMP 09/27/2013 No data found.   Physical Exam  Constitutional: She appears well-developed. No distress.  Cardiovascular: Normal rate, regular rhythm and normal heart sounds.   No murmur heard. Pulmonary/Chest: Effort normal and breath sounds normal. No respiratory distress. She has no wheezes.  Abdominal: Soft. Bowel sounds are normal. There is no hepatosplenomegaly. There is tenderness in the left lower quadrant. There is no rigidity, no rebound, no guarding, no CVA tenderness and negative Murphy's sign.  Skin: Skin is warm. No rash noted.     Nursing note and vitals reviewed.   ED Course  Procedures (including critical care time)  Labs Review Labs Reviewed  POCT  URINALYSIS DIP (DEVICE)    Imaging Review No results found.   Visual Acuity Review  Right Eye Distance:   Left Eye Distance:   Bilateral Distance:    Right Eye Near:   Left Eye Near:    Bilateral Near:         MDM  No diagnosis found. Cyst of left ovary  Suprapubic pain, left  Skin rash  I reviewed her record. I was unable to find any recent U/S. As discussed with her based on her story, it seems she will benefit from seeing a gynecologist for which she stated she is already established with one. She has surgery planned in January. I gave Naproxen prn pain pending F/U with Gyn. Return precaution discussed.   I also reassured her that I was unable to appreciate any rash on her gluteus. F/U as needed.  Doreene ElandKehinde T Tinamarie Przybylski, MD 06/21/15 [phone]

## 2015-06-21 NOTE — ED Notes (Signed)
Reports she was dx w/an ovarian cyst to left ovary by GYN on Thursday  Pain is 10/10; Wont have surgery until 07/17/15 Denies fevers, chills, urinary sx A&O x4... No acute distress.

## 2015-12-17 ENCOUNTER — Emergency Department (HOSPITAL_COMMUNITY)
Admission: EM | Admit: 2015-12-17 | Discharge: 2015-12-18 | Disposition: A | Discharge status: Home / Self Care | Payer: Medicare Other | Attending: Emergency Medicine | Admitting: Emergency Medicine

## 2015-12-17 DIAGNOSIS — Z791 Long term (current) use of non-steroidal anti-inflammatories (NSAID): Secondary | ICD-10-CM | POA: Insufficient documentation

## 2015-12-17 DIAGNOSIS — M5442 Lumbago with sciatica, left side: Secondary | ICD-10-CM | POA: Insufficient documentation

## 2015-12-17 DIAGNOSIS — Z79899 Other long term (current) drug therapy: Secondary | ICD-10-CM | POA: Diagnosis not present

## 2015-12-17 DIAGNOSIS — M25552 Pain in left hip: Secondary | ICD-10-CM | POA: Diagnosis present

## 2015-12-17 DIAGNOSIS — M545 Low back pain: Secondary | ICD-10-CM

## 2015-12-17 DIAGNOSIS — Z792 Long term (current) use of antibiotics: Secondary | ICD-10-CM | POA: Diagnosis not present

## 2015-12-17 DIAGNOSIS — Z87891 Personal history of nicotine dependence: Secondary | ICD-10-CM | POA: Insufficient documentation

## 2015-12-18 ENCOUNTER — Emergency Department (HOSPITAL_COMMUNITY): Payer: Medicare Other

## 2015-12-18 ENCOUNTER — Encounter (HOSPITAL_COMMUNITY): Payer: Self-pay | Admitting: Emergency Medicine

## 2015-12-18 DIAGNOSIS — M5442 Lumbago with sciatica, left side: Secondary | ICD-10-CM | POA: Diagnosis not present

## 2015-12-18 MED ORDER — NAPROXEN 500 MG PO TABS: 500.0000 mg | ORAL_TABLET | Freq: Two times a day (BID) | ORAL | Status: DC

## 2015-12-18 MED ORDER — CYCLOBENZAPRINE HCL 10 MG PO TABS
5.0000 mg | ORAL_TABLET | Freq: Once | ORAL | Status: AC
  Administered 2015-12-18: 5 mg via ORAL
  Filled 2015-12-18: qty 1

## 2015-12-18 NOTE — ED Provider Notes (Signed)
CSN: [id number]     Arrival date & time 12/17/15  2343 History  By signing my name below, I, [facility], attest that this documentation has been prepared under the direction and in the presence of Shon Baton, MD. Electronically Signed: Randell Patient, ED Scribe. 12/18/2015. 1:56 AM.   Chief Complaint  Patient presents with  . Hip Pain    The history is provided by the patient. No language interpreter was used.   HPI Comments: Cherylynn L Deegan is a 35 y.o. female with an hx of chronic back pain, interstitial cystitis, anxiety, mental disorder who presents to the Emergency Department complaining of constant, 3/10, tearing, left hip pain over her tens unit onset earlier this week. She describes the pain as slow pulling sensation in her hip. Pt states that she has a tens unit placed in her left hip by Dr. Logan Bores for treatment of her interstitial cystitis who she cannot follow-up with and that earlier this year she has had significant weight gain and weight loss that has caused the unit to move out of place and cause her pain. She has taken muscle relaxers without relief. She notes similar symptoms once earlier this year that resolved with at-home symptomatic treatment. Per patient, she is part-Buddhist and cannot take pain medication. Denies any other symptoms currently.  Past Medical History  Diagnosis Date  . Bipolar 1 disorder (HCC)   . Migraine   . Mental disorder   . Anxiety   . Interstitial cystitis   . Galactorrhea   . Chronic back pain   . Seasonal allergies   . Sinusitis   . IBS (irritable bowel syndrome)   . IC (interstitial cystitis)   . Insomnia   . GERD (gastroesophageal reflux disease)    Past Surgical History  Procedure Laterality Date  . Tubal ligation    . Bunionectomy    . Interstim implant placement    . Bowel resection  1998    prolapsed bowel  . Foot surgery      bunionectomy bil. pins put in and taken out  . Bladder surgery      distenstion  numerous times  . Laparoscopy Right 06/13/2013    Procedure: LAPAROSCOPY OPERATIVE WITH OVARIAN CYSTECTOMY convert to open 1329;  Surgeon: Purcell Nails, MD;  Location: [facility];  Service: Gynecology;  Laterality: Right;  . Laparotomy Right 06/13/2013    Procedure: EXPLORATORY LAPAROTOMY, ovarian cyst;  Surgeon: Purcell Nails, MD;  Location: [facility];  Service: Gynecology;  Laterality: Right;  . Intrauterine device insertion  2 yrs ago    Mirena   Family History  Problem Relation Age of Onset  . Hypertension Mother   . Esophageal cancer Maternal Grandmother   . Hypertension Maternal Grandmother   . Heart disease Maternal Grandmother   . Stomach cancer Maternal Grandmother   . Diabetes Maternal Grandfather   . Stroke Maternal Grandfather   . Depression Maternal Grandfather   . Anxiety disorder Maternal Grandfather   . Hypertension Maternal Grandfather   . Heart disease Maternal Grandfather   . Heart disease Other   . Diabetes Father   . Heart disease Father   . Stroke Father    Social History  Substance Use Topics  . Smoking status: Former Smoker -- 2.5 years    Types: Cigars    Quit date: 06/06/2001  . Smokeless tobacco: Never Used  . Alcohol Use: Yes     Comment: last drink a few days ago   OB  History    Gravida Para Term Preterm AB TAB SAB Ectopic Multiple Living   0              Review of Systems  Musculoskeletal:       Left back pain  Skin: Negative for color change.  All other systems reviewed and are negative.     Allergies  Gabapentin; Hydrocodone; Augmentin; Azithromycin; Ciprofloxacin; Erythromycin; Garlic; Hydrocodone-acetaminophen; Methenamine; Morphine and related; Onion; Other; Prozac; and Pyridium  Home Medications   Prior to Admission medications   Medication Sig Start Date End Date Taking? Authorizing Provider  cyclobenzaprine (FLEXERIL) 5 MG tablet Take 1 tablet (5 mg total) by mouth at bedtime as needed for muscle spasms. 11/28/14  Yes Rodolph Bong, MD  estropipate (OGEN) 0.75 MG tablet Take 0.75 mg by mouth daily.   Yes Historical Provider, MD  levocetirizine (XYZAL) 5 MG tablet Take 5 mg by mouth every evening.   Yes Historical Provider, MD  sertraline (ZOLOFT) 50 MG tablet Take 50 mg by mouth daily.   Yes Historical Provider, MD  diclofenac (VOLTAREN) 50 MG EC tablet Take 1 tablet (50 mg total) by mouth 2 (two) times daily as needed. Patient not taking: Reported on 01/16/2015 11/28/14   Rodolph Bong, MD  hydrOXYzine (ATARAX/VISTARIL) 25 MG tablet Take 1 tablet (25 mg total) by mouth every 8 (eight) hours as needed (for interstitial cystitis). Patient not taking: Reported on 01/16/2015 11/23/13   Reuben Likes, MD  metoCLOPramide (REGLAN) 10 MG tablet Take 1 tablet (10 mg total) by mouth every 6 (six) hours as needed for nausea (nausea/headache). Patient not taking: Reported on 12/18/2015 01/17/15   Mercedes Camprubi-Soms, PA-C  naproxen (NAPROSYN) 500 MG tablet Take 1 tablet (500 mg total) by mouth 2 (two) times daily. 12/18/15   Shon Baton, MD  neomycin-polymyxin-hydrocortisone (CORTISPORIN) 3.5-10000-1 otic suspension Place 4 drops into the left ear 3 (three) times daily. Patient not taking: Reported on 01/16/2015 07/25/14   Rodolph Bong, MD  nitrofurantoin, macrocrystal-monohydrate, (MACROBID) 100 MG capsule Take 1 capsule (100 mg total) by mouth 2 (two) times daily. Patient not taking: Reported on 01/16/2015 11/10/13   Linna Hoff, MD  ondansetron (ZOFRAN) 4 MG tablet Take 1 tablet (4 mg total) by mouth every 8 (eight) hours as needed for nausea or vomiting (take only if needed). Patient not taking: Reported on 01/16/2015 11/28/13   York Spaniel, MD  ondansetron (ZOFRAN) 4 MG tablet Take 1 tablet (4 mg total) by mouth every 8 (eight) hours as needed for nausea or vomiting. Patient not taking: Reported on 01/16/2015 03/17/14   Mathis Fare Presson, PA  rizatriptan (MAXALT-MLT) 10 MG disintegrating tablet DISSOLVE 1 TABLET IN MOUTH AS  NEEDED FOR MIGRAINE, MAY REPEAT IN 2HRS IF NEEDED Patient not taking: Reported on 01/16/2015 07/01/14   York Spaniel, MD  tolterodine (DETROL LA) 4 MG 24 hr capsule Take 1 capsule (4 mg total) by mouth daily. Patient not taking: Reported on 01/16/2015 11/10/13   Linna Hoff, MD   BP 141/75 mmHg  Pulse 115  Temp(Src) 98.3 F (36.8 C) (Oral)  Resp 20  Ht  (1.575 m)  Wt 133 lb 3.2 oz (60.419 kg)  BMI 24.36 kg/m2  SpO2 97%  LMP 09/27/2013 Physical Exam  Constitutional: She is oriented to person, place, and time. She appears well-developed and well-nourished.  Anxious appearing, pacing the room  HENT:  Head: Normocephalic and atraumatic.  Cardiovascular: Regular rhythm and  normal heart sounds.   tachycardia  Pulmonary/Chest: Effort normal and breath sounds normal. No respiratory distress. She has no wheezes.  Abdominal: Soft. Bowel sounds are normal. There is no tenderness. There is no rebound.  Musculoskeletal:  Palpable TENS unit noted over the left sacral region, no overlying skin changes, no significant tenderness to palpation  Neurological: She is alert and oriented to person, place, and time.  Skin: Skin is warm and dry.  Psychiatric:  Anxious appearing and flighty  Nursing note and vitals reviewed.   ED Course  Procedures  DIAGNOSTIC STUDIES: Oxygen Saturation is 97% on RA, normal by my interpretation.    COORDINATION OF CARE: 1:26 AM Ordered x-ray of left hip. Will order Flexeril. Discussed treatment plan with pt at bedside and pt agreed to plan.   Labs Review Labs Reviewed - No data to display  Imaging Review Dg Hip Unilat With Pelvis 2-3 Views Left  12/18/2015  CLINICAL DATA:  35 year old female with left hip pain. EXAM: DG HIP (WITH OR WITHOUT PELVIS) 2-3V LEFT COMPARISON:  CT of the abdomen pelvis dated 11/08/2012 FINDINGS: There is no acute fracture or dislocation. The bones are well mineralized. A stimulator device is noted over the left iliac bone with  electrode extending into the left pelvic area. The soft tissues are grossly unremarkable. IMPRESSION: No acute fracture or dislocation. Electronically Signed   By: Elgie CollardArash  Radparvar M.D.   On: 12/18/2015 02:40   I have personally reviewed and evaluated these images and lab results as part of my medical decision-making.   EKG Interpretation None      MDM   Final diagnoses:  Left low back pain, with sciatica presence unspecified    Patient presents with pain associated with her TENS unit. She is concerned that it has moved. She no longer has follow-up with the original surgeon who placed the unit. Very anxious appearing and flighty during history taking. She does also multiple tangents. Regarding the TENS unit, there is no significant tenderness. No overlying skin changes. X-ray shows that it appears in appropriate place. Patient is requesting urology and surgery follow-up. She was provided with the on-call provider; however, it was discussed with the patient that she may need follow-up with her primary surgeon who placed the unit. She was given one dose of Flexeril while in the ER.  After history, exam, and medical workup I feel the patient has been appropriately medically screened and is safe for discharge home. Pertinent diagnoses were discussed with the patient. Patient was given return precautions.  I personally performed the services described in this documentation, which was scribed in my presence. The recorded information has been reviewed and is accurate.    Shon Batonourtney F Octavie Westerhold, MD 12/18/15 [phone]

## 2015-12-18 NOTE — ED Notes (Signed)
Patient has a tens unit in her left hip and she is in pain. She thinks that it needs to be readjusted because it feel like it has been moved.

## 2015-12-18 NOTE — Discharge Instructions (Signed)
You were seen today with concern for pain over your TENS unit.  It appears in place on x-ray. Naproxen as needed. You were given urology and surgical information if you require further evaluation.

## 2016-08-14 ENCOUNTER — Encounter (HOSPITAL_COMMUNITY): Payer: Self-pay | Admitting: Emergency Medicine

## 2016-08-14 DIAGNOSIS — N83202 Unspecified ovarian cyst, left side: Secondary | ICD-10-CM | POA: Insufficient documentation

## 2016-08-14 DIAGNOSIS — Z79899 Other long term (current) drug therapy: Secondary | ICD-10-CM | POA: Diagnosis not present

## 2016-08-14 DIAGNOSIS — Z87891 Personal history of nicotine dependence: Secondary | ICD-10-CM | POA: Diagnosis not present

## 2016-08-14 DIAGNOSIS — R1032 Left lower quadrant pain: Secondary | ICD-10-CM | POA: Diagnosis present

## 2016-08-14 NOTE — ED Triage Notes (Addendum)
Pt c/o lower left side abdominal pain for past 2 weeks. Pt has had ovarian cysts and a total hysterectomy. Pt reports her TENS machine stopped working and is having difficulty getting hold of PCP.

## 2016-08-15 ENCOUNTER — Emergency Department (HOSPITAL_COMMUNITY): Payer: Medicare Other

## 2016-08-15 ENCOUNTER — Emergency Department (HOSPITAL_COMMUNITY)
Admission: EM | Admit: 2016-08-15 | Discharge: 2016-08-15 | Disposition: A | Discharge status: Home / Self Care | Payer: Medicare Other | Attending: Emergency Medicine | Admitting: Emergency Medicine

## 2016-08-15 ENCOUNTER — Encounter (HOSPITAL_COMMUNITY): Payer: Self-pay | Admitting: Radiology

## 2016-08-15 DIAGNOSIS — N83202 Unspecified ovarian cyst, left side: Secondary | ICD-10-CM | POA: Diagnosis not present

## 2016-08-15 DIAGNOSIS — R1032 Left lower quadrant pain: Secondary | ICD-10-CM

## 2016-08-15 LAB — URINALYSIS, ROUTINE W REFLEX MICROSCOPIC
Bilirubin Urine: NEGATIVE
Glucose, UA: NEGATIVE mg/dL
Ketones, ur: NEGATIVE mg/dL
Leukocytes, UA: NEGATIVE
Nitrite: NEGATIVE
Protein, ur: NEGATIVE mg/dL
Specific Gravity, Urine: 1.009 (ref 1.005–1.030)
pH: 6 (ref 5.0–8.0)

## 2016-08-15 LAB — COMPREHENSIVE METABOLIC PANEL
ALT: 15 U/L (ref 14–54)
AST: 24 U/L (ref 15–41)
Albumin: 4.1 g/dL (ref 3.5–5.0)
Alkaline Phosphatase: 78 U/L (ref 38–126)
Anion gap: 9 (ref 5–15)
BUN: 11 mg/dL (ref 6–20)
CO2: 25 mmol/L (ref 22–32)
Calcium: 9.4 mg/dL (ref 8.9–10.3)
Chloride: 102 mmol/L (ref 101–111)
Creatinine, Ser: 0.65 mg/dL (ref 0.44–1.00)
GFR calc Af Amer: >60 mL/min (ref >60)
GFR calc non Af Amer: >60 mL/min (ref >60)
Glucose, Bld: 98 mg/dL (ref 65–99)
Potassium: 3.5 mmol/L (ref 3.5–5.1)
Sodium: 136 mmol/L (ref 135–145)
Total Bilirubin: 0.3 mg/dL (ref 0.3–1.2)
Total Protein: 7 g/dL (ref 6.5–8.1)

## 2016-08-15 LAB — CBC WITH DIFFERENTIAL/PLATELET
Basophils Absolute: 0 10*3/uL (ref 0.0–0.1)
Basophils Relative: 0 %
Eosinophils Absolute: 0.4 10*3/uL (ref 0.0–0.7)
Eosinophils Relative: 5 %
HCT: 36 % (ref 36.0–46.0)
Hemoglobin: 12.1 g/dL (ref 12.0–15.0)
Lymphocytes Relative: 29 %
Lymphs Abs: 2.2 10*3/uL (ref 0.7–4.0)
MCH: 29.2 pg (ref 26.0–34.0)
MCHC: 33.6 g/dL (ref 30.0–36.0)
MCV: 87 fL (ref 78.0–100.0)
Monocytes Absolute: 0.5 10*3/uL (ref 0.1–1.0)
Monocytes Relative: 7 %
Neutro Abs: 4.5 10*3/uL (ref 1.7–7.7)
Neutrophils Relative %: 59 %
Platelets: 297 10*3/uL (ref 150–400)
RBC: 4.14 MIL/uL (ref 3.87–5.11)
RDW: 12.6 % (ref 11.5–15.5)
WBC: 7.7 10*3/uL (ref 4.0–10.5)

## 2016-08-15 LAB — LIPASE, BLOOD: Lipase: 20 U/L (ref 11–51)

## 2016-08-15 MED ORDER — ONDANSETRON HCL 4 MG/2ML IJ SOLN
4.0000 mg | Freq: Once | INTRAMUSCULAR | Status: AC
  Administered 2016-08-15: 4 mg via INTRAVENOUS
  Filled 2016-08-15: qty 2

## 2016-08-15 MED ORDER — FENTANYL CITRATE (PF) 100 MCG/2ML IJ SOLN
100.0000 ug | Freq: Once | INTRAMUSCULAR | Status: AC
  Administered 2016-08-15: 100 ug via INTRAVENOUS
  Filled 2016-08-15: qty 2

## 2016-08-15 MED ORDER — SODIUM CHLORIDE 0.9 % IV BOLUS (SEPSIS)
1000.0000 mL | Freq: Once | INTRAVENOUS | Status: AC
  Administered 2016-08-15: 1000 mL via INTRAVENOUS

## 2016-08-15 MED ORDER — IOPAMIDOL (ISOVUE-300) INJECTION 61%
INTRAVENOUS | Status: AC
  Filled 2016-08-15: qty 100

## 2016-08-15 MED ORDER — IOPAMIDOL (ISOVUE-300) INJECTION 61%
100.0000 mL | Freq: Once | INTRAVENOUS | Status: AC | PRN
  Administered 2016-08-15: 100 mL via INTRAVENOUS
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
## 2016-09-23 ENCOUNTER — Encounter (HOSPITAL_COMMUNITY): Payer: Self-pay | Admitting: Emergency Medicine

## 2016-09-23 ENCOUNTER — Emergency Department (HOSPITAL_COMMUNITY): Payer: Medicare Other

## 2016-09-23 ENCOUNTER — Emergency Department (HOSPITAL_COMMUNITY)
Admission: EM | Admit: 2016-09-23 | Discharge: 2016-09-23 | Disposition: A | Discharge status: Home / Self Care | Payer: Medicare Other | Attending: Emergency Medicine | Admitting: Emergency Medicine

## 2016-09-23 DIAGNOSIS — R1032 Left lower quadrant pain: Secondary | ICD-10-CM

## 2016-09-23 DIAGNOSIS — Z79899 Other long term (current) drug therapy: Secondary | ICD-10-CM | POA: Insufficient documentation

## 2016-09-23 DIAGNOSIS — Z87891 Personal history of nicotine dependence: Secondary | ICD-10-CM | POA: Diagnosis not present

## 2016-09-23 DIAGNOSIS — N83209 Unspecified ovarian cyst, unspecified side: Secondary | ICD-10-CM

## 2016-09-23 DIAGNOSIS — R102 Pelvic and perineal pain: Secondary | ICD-10-CM

## 2016-09-23 DIAGNOSIS — N83202 Unspecified ovarian cyst, left side: Secondary | ICD-10-CM | POA: Insufficient documentation

## 2016-09-23 LAB — URINALYSIS, ROUTINE W REFLEX MICROSCOPIC
Bilirubin Urine: NEGATIVE
Glucose, UA: NEGATIVE mg/dL
Ketones, ur: NEGATIVE mg/dL
Leukocytes, UA: NEGATIVE
Nitrite: NEGATIVE
Protein, ur: NEGATIVE mg/dL
Specific Gravity, Urine: 1.013 (ref 1.005–1.030)
WBC, UA: NONE SEEN WBC/hpf (ref 0–5)
pH: 6 (ref 5.0–8.0)

## 2016-09-23 LAB — POC URINE PREG, ED: Preg Test, Ur: NEGATIVE

## 2016-09-23 MED ORDER — IBUPROFEN 800 MG PO TABS: 800.0000 mg | ORAL_TABLET | Freq: Three times a day (TID) | ORAL | 0 refills | Status: DC

## 2016-09-23 NOTE — Discharge Instructions (Signed)
Take motrin for pain. Follow-up with the women's clinic/hospital for any ongoing issues. Return to the ED for new or worsening symptoms.
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
## 2016-09-23 NOTE — ED Triage Notes (Signed)
Pt c/o pain associated with a pelvic cyst that was found 6 weeks ago; pt would like to be re-evaluated because she does not currently have a PCP and OBGYN; pt has had 2 ovarian cysts in the past and had a hysterectomy as a result; hx of endometriosis and HTN

## 2016-12-13 ENCOUNTER — Inpatient Hospital Stay (HOSPITAL_COMMUNITY)
Admission: AD | Admit: 2016-12-13 | Discharge: 2016-12-14 | Disposition: A | Discharge status: Home / Self Care | Payer: Medicare Other | Source: Ambulatory Visit | Attending: Obstetrics and Gynecology | Admitting: Obstetrics and Gynecology

## 2016-12-13 ENCOUNTER — Encounter (HOSPITAL_COMMUNITY): Payer: Self-pay | Admitting: *Deleted

## 2016-12-13 DIAGNOSIS — N83202 Unspecified ovarian cyst, left side: Secondary | ICD-10-CM | POA: Insufficient documentation

## 2016-12-13 DIAGNOSIS — F319 Bipolar disorder, unspecified: Secondary | ICD-10-CM | POA: Diagnosis not present

## 2016-12-13 DIAGNOSIS — G4700 Insomnia, unspecified: Secondary | ICD-10-CM | POA: Diagnosis not present

## 2016-12-13 DIAGNOSIS — Z880 Allergy status to penicillin: Secondary | ICD-10-CM | POA: Diagnosis not present

## 2016-12-13 DIAGNOSIS — I10 Essential (primary) hypertension: Secondary | ICD-10-CM | POA: Diagnosis not present

## 2016-12-13 DIAGNOSIS — K219 Gastro-esophageal reflux disease without esophagitis: Secondary | ICD-10-CM | POA: Diagnosis not present

## 2016-12-13 DIAGNOSIS — F419 Anxiety disorder, unspecified: Secondary | ICD-10-CM | POA: Insufficient documentation

## 2016-12-13 DIAGNOSIS — G8929 Other chronic pain: Secondary | ICD-10-CM | POA: Insufficient documentation

## 2016-12-13 DIAGNOSIS — R1032 Left lower quadrant pain: Secondary | ICD-10-CM | POA: Diagnosis present

## 2016-12-13 HISTORY — DX: Essential (primary) hypertension: I10

## 2016-12-13 LAB — POCT PREGNANCY, URINE: Preg Test, Ur: NEGATIVE

## 2016-12-13 LAB — URINALYSIS, ROUTINE W REFLEX MICROSCOPIC
Bilirubin Urine: NEGATIVE
Glucose, UA: NEGATIVE mg/dL
Ketones, ur: NEGATIVE mg/dL
Leukocytes, UA: NEGATIVE
Nitrite: NEGATIVE
Protein, ur: NEGATIVE mg/dL
Specific Gravity, Urine: 1.002 — ABNORMAL LOW (ref 1.005–1.030)
pH: 7 (ref 5.0–8.0)

## 2016-12-13 NOTE — MAU Note (Signed)
Pt states that she has a cyst and has a lot of pain in left side of abdomen. Pt denies bleeding or discharge. States she had a hysterectomy 3 years ago.

## 2016-12-14 DIAGNOSIS — N83202 Unspecified ovarian cyst, left side: Secondary | ICD-10-CM | POA: Diagnosis not present

## 2016-12-14 MED ORDER — IBUPROFEN 800 MG PO TABS: 800.0000 mg | ORAL_TABLET | Freq: Three times a day (TID) | ORAL | 0 refills | Status: DC | PRN

## 2016-12-14 MED ORDER — OXYCODONE HCL 5 MG PO CAPS: 5.0000 mg | ORAL_CAPSULE | Freq: Four times a day (QID) | ORAL | 0 refills | Status: DC | PRN

## 2016-12-14 MED ORDER — KETOROLAC TROMETHAMINE 60 MG/2ML IM SOLN
60.0000 mg | Freq: Once | INTRAMUSCULAR | Status: AC
  Administered 2016-12-14: 60 mg via INTRAMUSCULAR
  Filled 2016-12-14: qty 2

## 2016-12-14 NOTE — Discharge Instructions (Signed)
Pelvic Mass A pelvic mass is an abnormal growth in the pelvis. The pelvis is the area between your hip bones. It includes the bladder and the rectum in males and females, and also the uterus and ovaries in females. What are the causes? Many things can cause a pelvic mass, including:  Cancer.  Fibroids of the uterus.  Ovarian cysts.  Infection.  Ectopic pregnancy.  What are the signs or symptoms? Symptoms of a pelvic mass may include:  Cramping.  Nausea.  Diarrhea.  Fever.  Vomiting.  Weakness.  Pain in the pelvis, side, or back.  Weight loss.  Constipation.  Problems with vaginal bleeding, including: ? Light or heavy bleeding with or without blood clots. ? Irregular menstruation. ? Pain with menstruation.  Problems with urination, including: ? Frequent urination. ? Inability to empty the bladder completely. ? Urinating very small amounts. ? Pain with urination. ? Bloody urine.  Some pelvic masses do not cause symptoms. How is this diagnosed? To make a diagnosis, your health care provider will need to learn more about the mass. You may have tests or procedures done, such as:  Blood tests.  X-rays.  Ultrasound.  CT scan.  MRI.  A surgery to look inside of your abdomen with cameras (laparoscopy).  A biopsy that is performed with a needle or during laparoscopy or surgery.  In some cases, what seemed like a pelvic mass may actually be something else, such as a mass in one of the organs that are near the pelvis, an infection (abscess) or scar tissue (adhesions) that formed after a surgery. How is this treated? Treatment will depend on the cause of the mass. Follow these instructions at home: What you need to do at home will depend on the cause of the mass. Follow the instructions that your health care provider gives to you. In general:  Keep all follow-up visits as directed by your health care provider. This is important.  Take medicines only as  directed by your health care provider.  Follow any restrictions that are given to you by your health care provider.  Contact a health care provider if:  You develop new symptoms. Get help right away if:  You vomit bright red blood or vomit material that looks like coffee grounds.  You have blood in your stools, or the stools turn black and tarry.  You have an abnormal or increased amount of vaginal bleeding.  You have a fever.  You develop easy bruising or bleeding.  You develop sudden or worsening pain that is not controlled by your medicine.  You feel worsening weakness, or you have a fainting episode.  You feel that the mass has suddenly gotten larger.  You develop severe bloating in your abdomen or your pelvis.  You cannot pass any urine.  You are unable to have a bowel movement. This information is not intended to replace advice given to you by your health care provider. Make sure you discuss any questions you have with your health care provider. Document Released: 10/05/2006 Document Revised: 12/04/2015 Document Reviewed: 02/11/2014 Elsevier Interactive Patient Education  2018 [employer]  

## 2016-12-14 NOTE — MAU Provider Note (Signed)
History     CSN: [id number]  Arrival date and time: 12/13/16 2254  First Provider Initiated Contact with Patient 12/14/16 0014      Chief Complaint  Patient presents with  . Abdominal Pain   HPI  [name] is a 36 y.o. G0P0 female who presents for abdominal pain. Currently is going to [facility] for gyn care. Patient has extensive history including endometriosis & multiple ovarian cysts resulting in hysterectomy & BSO. Recently diagnosed with left ovarian cyst & was told that there were remnants of ovarian tissue left after her left oopherectomy. Per Care Everywhere, patient is being seen by [facility] in [facility] -- labs are pending to determine if surgery necessary. Last seen by them on 12/09/16. Pt was prescribed ocycodone #20 on 5/23 & ran out of medication yesterday. Has been taking tramadol & flexeril without relief. States ibuprofen was working to control pain as well but ran out of her rx for 800 mg & states OTC dosage does not work. Abdominal pain is intermittent & sharp in her LLQ. Rates pain 8/10. Denies fever/chills, n/v/d, constipation, vaginal bleeding, dysuria, or vaginal discharge.   Past Medical History:  Diagnosis Date  . Anxiety   . Bipolar 1 disorder (HCC)   . Bipolar 1 disorder (HCC)   . Chronic back pain   . Galactorrhea   . GERD (gastroesophageal reflux disease)   . Hypertension   . IBS (irritable bowel syndrome)   . IC (interstitial cystitis)   . Insomnia   . Interstitial cystitis   . Mental disorder   . Migraine   . Seasonal allergies   . Sinusitis     Past Surgical History:  Procedure Laterality Date  . ABDOMINAL HYSTERECTOMY    . BLADDER SURGERY     distenstion numerous times  . BOWEL RESECTION  1998   prolapsed bowel  . BUNIONECTOMY    . FOOT SURGERY     bunionectomy bil. pins put in and taken out  . INTERSTIM IMPLANT PLACEMENT    . INTRAUTERINE DEVICE INSERTION  2 yrs ago   Mirena  . LAPAROSCOPY Right 06/13/2013   Procedure:  LAPAROSCOPY OPERATIVE WITH OVARIAN CYSTECTOMY convert to open 1329;  Surgeon: Purcell Nails, MD;  Location: [facility];  Service: Gynecology;  Laterality: Right;  . LAPAROTOMY Right 06/13/2013   Procedure: EXPLORATORY LAPAROTOMY, ovarian cyst;  Surgeon: Purcell Nails, MD;  Location: [facility];  Service: Gynecology;  Laterality: Right;  . TUBAL LIGATION      Family History  Problem Relation Age of Onset  . Hypertension Mother   . Diabetes Father   . Heart disease Father   . Stroke Father   . Esophageal cancer Maternal Grandmother   . Hypertension Maternal Grandmother   . Heart disease Maternal Grandmother   . Stomach cancer Maternal Grandmother   . Diabetes Maternal Grandfather   . Stroke Maternal Grandfather   . Depression Maternal Grandfather   . Anxiety disorder Maternal Grandfather   . Hypertension Maternal Grandfather   . Heart disease Maternal Grandfather   . Heart disease Other     Social History  Substance Use Topics  . Smoking status: Former Smoker    Years: 2.50    Types: Cigars    Quit date: 06/06/2001  . Smokeless tobacco: Never Used  . Alcohol use Yes     Comment: a few sips of wine tonight.4 JUn 18    Allergies:  Allergies  Allergen Reactions  . Gabapentin Other (See  Comments)    Reaction:  Suicidal thoughts   . Hydrocodone Shortness Of Breath  . Methadone Other (See Comments)    Confusion/fainting  . Augmentin [Amoxicillin-Pot Clavulanate] Nausea And Vomiting and Other (See Comments)    Has patient had a PCN reaction causing immediate rash, facial/tongue/throat swelling, SOB or lightheadedness with hypotension: No Has patient had a PCN reaction causing severe rash involving mucus membranes or skin necrosis: No Has patient had a PCN reaction that required hospitalization No Has patient had a PCN reaction occurring within the last 10 years: No If all of the above answers are "NO", then may proceed with Cephalosporin use.  . Azithromycin Other (See Comments)     Pt states that this medication does not work for her.    . Buprenorphine Itching  . Ciprofloxacin Nausea Only  . Erythromycin Nausea And Vomiting  . Garlic Other (See Comments)    Reaction:  Bladder issues   . Hydrocodone-Acetaminophen Other (See Comments)    Pt states that this medication made her pass out.    . Methenamine Other (See Comments)    Reaction:  Urinary retention   . Morphine And Related Itching  . Onion Nausea And Vomiting  . Prozac [Fluoxetine Hcl] Other (See Comments)    Reaction:  Suicidal thoughts   . Pyridium [Phenazopyridine Hcl] Other (See Comments)    Reaction:  Bladder pain   . Uribel [Meth-Hyo-M Bl-Na Phos-Ph Sal] Other (See Comments)    Reaction:  Bladder issues     Prescriptions Prior to Admission  Medication Sig Dispense Refill Last Dose  . Beclomethasone Dipropionate (QNASL) 80 MCG/ACT AERS Place 1 puff into both nostrils daily.    12/12/2016 at Unknown time  . cyclobenzaprine (FLEXERIL) 5 MG tablet Take 5 mg by mouth 3 (three) times daily as needed for muscle spasms.   12/13/2016 at Unknown time  . levocetirizine (XYZAL) 5 MG tablet Take 5 mg by mouth at bedtime.    12/13/2016 at Unknown time  . ibuprofen (ADVIL,MOTRIN) 800 MG tablet Take 1 tablet (800 mg total) by mouth 3 (three) times daily. 21 tablet 0 More than a month at Unknown time    Review of Systems  Constitutional: Negative.   Gastrointestinal: Positive for abdominal pain. Negative for constipation, diarrhea, nausea and vomiting.  Genitourinary: Negative for dysuria, vaginal bleeding and vaginal discharge.   Physical Exam   Blood pressure (!) 147/94, pulse 92, temperature 98.8 F (37.1 C), temperature source Oral, resp. rate 16, height 5\' 3"  (1.6 m), weight 137 lb (62.1 kg), last menstrual period 09/27/2013, SpO2 100 %.  Physical Exam  Nursing note and vitals reviewed. Constitutional: She is oriented to person, place, and time. She appears well-developed and well-nourished. No distress.   HENT:  Head: Normocephalic and atraumatic.  Eyes: Conjunctivae are normal. Right eye exhibits no discharge. Left eye exhibits no discharge. No scleral icterus.  Neck: Normal range of motion.  Cardiovascular: Normal rate, regular rhythm and normal heart sounds.   No murmur heard. Respiratory: Effort normal and breath sounds normal. No respiratory distress. She has no wheezes.  GI: Soft. Bowel sounds are normal. She exhibits no distension and no mass. There is tenderness in the left lower quadrant. There is no rigidity, no rebound and no guarding.  Neurological: She is alert and oriented to person, place, and time.  Skin: Skin is warm and dry. She is not diaphoretic.  Psychiatric: She has a normal mood and affect. Her behavior is normal. Judgment and thought content  normal.    MAU Course  Procedures Results for orders placed or performed during the hospital encounter of 12/13/16 (from the past 24 hour(s))  Urinalysis, Routine w reflex microscopic     Status: Abnormal   Collection Time: 12/13/16 11:02 PM  Result Value Ref Range   Color, Urine STRAW (A) YELLOW   APPearance CLEAR CLEAR   Specific Gravity, Urine 1.002 (L) 1.005 - 1.030   pH 7.0 5.0 - 8.0   Glucose, UA NEGATIVE NEGATIVE mg/dL   Hgb urine dipstick SMALL (A) NEGATIVE   Bilirubin Urine NEGATIVE NEGATIVE   Ketones, ur NEGATIVE NEGATIVE mg/dL   Protein, ur NEGATIVE NEGATIVE mg/dL   Nitrite NEGATIVE NEGATIVE   Leukocytes, UA NEGATIVE NEGATIVE   RBC / HPF 0-5 0 - 5 RBC/hpf   WBC, UA 0-5 0 - 5 WBC/hpf   Bacteria, UA RARE (A) NONE SEEN   Squamous Epithelial / LPF 0-5 (A) NONE SEEN  Pregnancy, urine POC     Status: None   Collection Time: 12/13/16 11:05 PM  Result Value Ref Range   Preg Test, Ur NEGATIVE NEGATIVE    MDM VSS, NAD toradol 60 mg IM  Assessment and Plan  A: 1. Cyst of left ovary    P: Discharge home Rx ibuprofen & oxycodone #10 Call gyn tomorrow for f/u appointment Discussed reasons to return    Judeth Horn 12/14/2016, 12:13 AM

## 2017-01-14 ENCOUNTER — Encounter (HOSPITAL_COMMUNITY): Payer: Self-pay | Admitting: *Deleted

## 2017-01-14 ENCOUNTER — Ambulatory Visit (HOSPITAL_COMMUNITY)
Admission: EM | Admit: 2017-01-14 | Discharge: 2017-01-14 | Disposition: A | Discharge status: Home / Self Care | Payer: Medicare Other | Attending: Internal Medicine | Admitting: Internal Medicine

## 2017-01-14 DIAGNOSIS — R350 Frequency of micturition: Secondary | ICD-10-CM | POA: Insufficient documentation

## 2017-01-14 DIAGNOSIS — R319 Hematuria, unspecified: Secondary | ICD-10-CM | POA: Diagnosis not present

## 2017-01-14 DIAGNOSIS — R300 Dysuria: Secondary | ICD-10-CM | POA: Diagnosis present

## 2017-01-14 LAB — POCT URINALYSIS DIP (DEVICE)
Bilirubin Urine: NEGATIVE
Glucose, UA: NEGATIVE mg/dL
Ketones, ur: NEGATIVE mg/dL
Leukocytes, UA: NEGATIVE
Nitrite: NEGATIVE
Protein, ur: NEGATIVE mg/dL
Specific Gravity, Urine: 1.02 (ref 1.005–1.030)
Urobilinogen, UA: 0.2 mg/dL (ref 0.0–1.0)
pH: 6.5 (ref 5.0–8.0)

## 2017-01-14 MED ORDER — NITROFURANTOIN MONOHYD MACRO 100 MG PO CAPS: 100.0000 mg | ORAL_CAPSULE | Freq: Two times a day (BID) | ORAL | 0 refills | Status: DC

## 2017-01-14 NOTE — ED Triage Notes (Signed)
Pt  Reports  Urinary  Frequency    Scanty output    And  Bloating   When  Urinated  Pt  States  She  Thinks  It  Came  From a  catherization  During a  Recent  Procedure

## 2017-01-14 NOTE — ED Provider Notes (Signed)
CSN: [id number]     Arrival date & time 01/14/17  1901 History   None    Chief Complaint  Patient presents with  . Urinary Frequency   (Consider location/radiation/quality/duration/timing/severity/associated sxs/prior Treatment) Patient c/o urinary frequency and dysuria.  She recently has had surgery for endometriosis and she had to have a foley cath.   The history is provided by the patient.  Urinary Frequency  This is a new problem. The problem occurs constantly. The problem has not changed since onset.Nothing aggravates the symptoms. Nothing relieves the symptoms. She has tried nothing for the symptoms.    Past Medical History:  Diagnosis Date  . Anxiety   . Bipolar 1 disorder (HCC)   . Bipolar 1 disorder (HCC)   . Chronic back pain   . Galactorrhea   . GERD (gastroesophageal reflux disease)   . Hypertension   . IBS (irritable bowel syndrome)   . IC (interstitial cystitis)   . Insomnia   . Interstitial cystitis   . Mental disorder   . Migraine   . Seasonal allergies   . Sinusitis    Past Surgical History:  Procedure Laterality Date  . ABDOMINAL HYSTERECTOMY    . BLADDER SURGERY     distenstion numerous times  . BOWEL RESECTION  1998   prolapsed bowel  . BUNIONECTOMY    . FOOT SURGERY     bunionectomy bil. pins put in and taken out  . INTERSTIM IMPLANT PLACEMENT    . INTRAUTERINE DEVICE INSERTION  2 yrs ago   Mirena  . LAPAROSCOPY Right 06/13/2013   Procedure: LAPAROSCOPY OPERATIVE WITH OVARIAN CYSTECTOMY convert to open 1329;  Surgeon: Purcell NailsAngela Y Roberts, MD;  Location: [facility];  Service: Gynecology;  Laterality: Right;  . LAPAROTOMY Right 06/13/2013   Procedure: EXPLORATORY LAPAROTOMY, ovarian cyst;  Surgeon: Purcell NailsAngela Y Roberts, MD;  Location: [facility];  Service: Gynecology;  Laterality: Right;  . TUBAL LIGATION     Family History  Problem Relation Age of Onset  . Hypertension Mother   . Diabetes Father   . Heart disease Father   . Stroke Father   . Esophageal  cancer Maternal Grandmother   . Hypertension Maternal Grandmother   . Heart disease Maternal Grandmother   . Stomach cancer Maternal Grandmother   . Diabetes Maternal Grandfather   . Stroke Maternal Grandfather   . Depression Maternal Grandfather   . Anxiety disorder Maternal Grandfather   . Hypertension Maternal Grandfather   . Heart disease Maternal Grandfather   . Heart disease Other    Social History  Substance Use Topics  . Smoking status: Former Smoker    Years: 2.50    Types: Cigars    Quit date: 06/06/2001  . Smokeless tobacco: Never Used  . Alcohol use Yes     Comment: a few sips of wine tonight.4 JUn 18   OB History    Gravida Para Term Preterm AB Living   0             SAB TAB Ectopic Multiple Live Births                 Review of Systems  Constitutional: Negative.   HENT: Negative.   Eyes: Negative.   Respiratory: Negative.   Cardiovascular: Negative.   Gastrointestinal: Negative.   Endocrine: Negative.   Genitourinary: Positive for frequency.  Musculoskeletal: Negative.   Allergic/Immunologic: Negative.     Allergies  Gabapentin; Hydrocodone; Methadone; Augmentin [amoxicillin-pot clavulanate]; Azithromycin; Buprenorphine; Ciprofloxacin; Erythromycin; Garlic; Hydrocodone-acetaminophen;  Methenamine; Morphine and related; Onion; Prozac [fluoxetine hcl]; Pyridium [phenazopyridine hcl]; and Uribel [meth-hyo-m bl-na phos-ph sal]  Home Medications   Prior to Admission medications   Medication Sig Start Date End Date Taking? Authorizing Provider  Beclomethasone Dipropionate (QNASL) 80 MCG/ACT AERS Place 1 puff into both nostrils daily.     [provider]  cyclobenzaprine (FLEXERIL) 5 MG tablet Take 5 mg by mouth 3 (three) times daily as needed for muscle spasms.    [provider]  ibuprofen (ADVIL,MOTRIN) 800 MG tablet Take 1 tablet (800 mg total) by mouth 3 (three) times daily. 09/23/16   Garlon Hatchet, PA-C  ibuprofen (ADVIL,MOTRIN) 800  MG tablet Take 1 tablet (800 mg total) by mouth every 8 (eight) hours as needed for headache or moderate pain. 12/14/16   Judeth Horn, NP  levocetirizine (XYZAL) 5 MG tablet Take 5 mg by mouth at bedtime.     [provider]  nitrofurantoin, macrocrystal-monohydrate, (MACROBID) 100 MG capsule Take 1 capsule (100 mg total) by mouth 2 (two) times daily. 01/14/17   Deatra Canter, FNP  oxycodone (OXY-IR) 5 MG capsule Take 1 capsule (5 mg total) by mouth every 6 (six) hours as needed. 12/14/16   Judeth Horn, NP   Meds Ordered and Administered this Visit  Medications - No data to display  BP (!) 148/98 (BP Location: Right Arm)   Pulse 88   Temp 99.5 F (37.5 C) (Oral)   Resp 18   LMP 09/27/2013   SpO2 100%  No data found.   Physical Exam  Constitutional: She appears well-developed and well-nourished.  HENT:  Head: Normocephalic and atraumatic.  Eyes: Conjunctivae and EOM are normal. Pupils are equal, round, and reactive to light.  Neck: Normal range of motion. Neck supple.  Cardiovascular: Normal rate, regular rhythm and normal heart sounds.   Pulmonary/Chest: Effort normal and breath sounds normal.  Abdominal: Soft.  Nursing note and vitals reviewed.   Urgent Care Course     Procedures (including critical care time)  Labs Review Labs Reviewed  POCT URINALYSIS DIP (DEVICE) - Abnormal; Notable for the following:       Result Value   Hgb urine dipstick MODERATE (*)    All other components within normal limits  URINE CULTURE    Imaging Review No results found.   Visual Acuity Review  Right Eye Distance:   Left Eye Distance:   Bilateral Distance:    Right Eye Near:   Left Eye Near:    Bilateral Near:         MDM   1. Dysuria   2. Hematuria, unspecified type    Macrobid 100mg  one po bid x 5 days  UA cx      Deatra Canter, FNP 01/14/17 2001

## 2017-01-16 LAB — URINE CULTURE: Culture: <10000 — AB

## 2017-02-01 ENCOUNTER — Ambulatory Visit (HOSPITAL_COMMUNITY)
Admission: EM | Admit: 2017-02-01 | Discharge: 2017-02-01 | Disposition: A | Discharge status: Home / Self Care | Payer: Medicare Other | Attending: Family Medicine | Admitting: Family Medicine

## 2017-02-01 ENCOUNTER — Encounter (HOSPITAL_COMMUNITY): Payer: Self-pay

## 2017-02-01 DIAGNOSIS — S61238A Puncture wound without foreign body of other finger without damage to nail, initial encounter: Secondary | ICD-10-CM

## 2017-02-01 DIAGNOSIS — W268XXA Contact with other sharp object(s), not elsewhere classified, initial encounter: Secondary | ICD-10-CM | POA: Diagnosis not present

## 2017-02-01 DIAGNOSIS — S61232A Puncture wound without foreign body of right middle finger without damage to nail, initial encounter: Secondary | ICD-10-CM

## 2017-02-01 DIAGNOSIS — Z23 Encounter for immunization: Secondary | ICD-10-CM

## 2017-02-01 MED ORDER — TETANUS-DIPHTH-ACELL PERTUSSIS 5-2.5-18.5 LF-MCG/0.5 IM SUSP
INTRAMUSCULAR | Status: AC
  Filled 2017-02-01: qty 0.5

## 2017-02-01 MED ORDER — TETANUS-DIPHTH-ACELL PERTUSSIS 5-2.5-18.5 LF-MCG/0.5 IM SUSP
0.5000 mL | Freq: Once | INTRAMUSCULAR | Status: AC
  Administered 2017-02-01: 0.5 mL via INTRAMUSCULAR

## 2017-02-01 NOTE — ED Provider Notes (Signed)
CSN: [id number]     Arrival date & time 02/01/17  1839 History   None    Chief Complaint  Patient presents with  . Puncture Wound    Tack   (Consider location/radiation/quality/duration/timing/severity/associated sxs/prior Treatment) 36 year old female presents for evaluation of a puncture wound to the middle finger of her right hand, occurred 3 days ago. Was working removing molding from the floor and stuck her finger with a floor tach. She is here for tetanus shot. States that her pain, signs, and symptoms have been improving, no swelling in the finger, redness, or other signs of infection. No fever, no chills, no red streaking, etc.   The history is provided by the patient.    Past Medical History:  Diagnosis Date  . Anxiety   . Bipolar 1 disorder (HCC)   . Bipolar 1 disorder (HCC)   . Chronic back pain   . Galactorrhea   . GERD (gastroesophageal reflux disease)   . Hypertension   . IBS (irritable bowel syndrome)   . IC (interstitial cystitis)   . Insomnia   . Interstitial cystitis   . Mental disorder   . Migraine   . Seasonal allergies   . Sinusitis    Past Surgical History:  Procedure Laterality Date  . ABDOMINAL HYSTERECTOMY    . BLADDER SURGERY     distenstion numerous times  . BOWEL RESECTION  1998   prolapsed bowel  . BUNIONECTOMY    . FOOT SURGERY     bunionectomy bil. pins put in and taken out  . INTERSTIM IMPLANT PLACEMENT    . INTRAUTERINE DEVICE INSERTION  2 yrs ago   Mirena  . LAPAROSCOPY Right 06/13/2013   Procedure: LAPAROSCOPY OPERATIVE WITH OVARIAN CYSTECTOMY convert to open 1329;  Surgeon: Purcell NailsAngela Y Roberts, MD;  Location: [facility];  Service: Gynecology;  Laterality: Right;  . LAPAROTOMY Right 06/13/2013   Procedure: EXPLORATORY LAPAROTOMY, ovarian cyst;  Surgeon: Purcell NailsAngela Y Roberts, MD;  Location: [facility];  Service: Gynecology;  Laterality: Right;  . TUBAL LIGATION     Family History  Problem Relation Age of Onset  . Hypertension Mother   . Diabetes  Father   . Heart disease Father   . Stroke Father   . Esophageal cancer Maternal Grandmother   . Hypertension Maternal Grandmother   . Heart disease Maternal Grandmother   . Stomach cancer Maternal Grandmother   . Diabetes Maternal Grandfather   . Stroke Maternal Grandfather   . Depression Maternal Grandfather   . Anxiety disorder Maternal Grandfather   . Hypertension Maternal Grandfather   . Heart disease Maternal Grandfather   . Heart disease Other    Social History  Substance Use Topics  . Smoking status: Former Smoker    Years: 2.50    Types: Cigars    Quit date: 06/06/2001  . Smokeless tobacco: Never Used  . Alcohol use Yes     Comment: a few sips of wine tonight.4 JUn 18   OB History    Gravida Para Term Preterm AB Living   0             SAB TAB Ectopic Multiple Live Births                 Review of Systems  Skin: Positive for wound.  All other systems reviewed and are negative.   Allergies  Gabapentin; Hydrocodone; Methadone; Augmentin [amoxicillin-pot clavulanate]; Azithromycin; Buprenorphine; Ciprofloxacin; Erythromycin; Garlic; Hydrocodone-acetaminophen; Methenamine; Morphine and related; Onion; Prozac [fluoxetine hcl]; Pyridium [phenazopyridine  hcl]; and Uribel [meth-hyo-m bl-na phos-ph sal]  Home Medications   Prior to Admission medications   Medication Sig Start Date End Date Taking? Authorizing Provider  Beclomethasone Dipropionate (QNASL) 80 MCG/ACT AERS Place 1 puff into both nostrils daily.    Yes [provider]  cyclobenzaprine (FLEXERIL) 5 MG tablet Take 5 mg by mouth 3 (three) times daily as needed for muscle spasms.   Yes [provider]  ibuprofen (ADVIL,MOTRIN) 800 MG tablet Take 1 tablet (800 mg total) by mouth 3 (three) times daily. 09/23/16  Yes Garlon Hatchet, PA-C  ibuprofen (ADVIL,MOTRIN) 800 MG tablet Take 1 tablet (800 mg total) by mouth every 8 (eight) hours as needed for headache or moderate pain. 12/14/16  Yes Judeth Horn, NP  levocetirizine (XYZAL) 5 MG tablet Take 5 mg by mouth at bedtime.    Yes [provider]  nitrofurantoin, macrocrystal-monohydrate, (MACROBID) 100 MG capsule Take 1 capsule (100 mg total) by mouth 2 (two) times daily. 01/14/17   Deatra Canter, FNP  oxycodone (OXY-IR) 5 MG capsule Take 1 capsule (5 mg total) by mouth every 6 (six) hours as needed. 12/14/16   Judeth Horn, NP   Meds Ordered and Administered this Visit   Medications  Tdap (BOOSTRIX) injection 0.5 mL (not administered)    BP 126/90 (BP Location: Right Arm)   Pulse 99   Temp 99.3 F (37.4 C) (Oral)   Resp 17   LMP 09/27/2013   SpO2 98%  No data found.   Physical Exam  Constitutional: She is oriented to person, place, and time. She appears well-developed and well-nourished. No distress.  HENT:  Head: Normocephalic and atraumatic.  Right Ear: External ear normal.  Left Ear: External ear normal.  Neurological: She is alert and oriented to person, place, and time.  Skin: Skin is warm and dry. Capillary refill takes less than 2 seconds. No rash noted. She is not diaphoretic. No erythema.  Small puncture wound to the pad of the middle finger of the right hand, no tenderness to palpation, no visible swelling, capillary refill less than 2 seconds, no pain with palpation of the distal interphalangeal joint, or proximal interphalangeal joint  Psychiatric: She has a normal mood and affect. Her behavior is normal.  Nursing note and vitals reviewed.   Urgent Care Course     Procedures (including critical care time)  Labs Review Labs Reviewed - No data to display  Imaging Review No results found.     MDM   1. Puncture wound of middle finger, initial encounter    Tetanus vaccine updated, given contact information for hand specialist if needed, otherwise return to clinic as needed.    Dorena Bodo, NP 02/01/17 1930

## 2017-02-01 NOTE — ED Triage Notes (Signed)
[facility] a puncture wound from a tack in the floor on her middle finger on right hand x2 days, pt states it pulled out a big chunk of meat in finger and is requesting a [employer] Shot

## 2017-02-01 NOTE — Discharge Instructions (Signed)
Your tetanus vaccine is been updated. If you have any signs or symptoms of infection return to clinic as needed. There are any complications with wound healing, contact the hand specialist that I have include the contact information for.

## 2017-03-22 ENCOUNTER — Encounter (HOSPITAL_COMMUNITY): Payer: Self-pay | Admitting: Emergency Medicine

## 2017-03-22 ENCOUNTER — Emergency Department (HOSPITAL_COMMUNITY): Payer: Medicare Other

## 2017-03-22 ENCOUNTER — Emergency Department (HOSPITAL_COMMUNITY)
Admission: EM | Admit: 2017-03-22 | Discharge: 2017-03-22 | Disposition: A | Discharge status: Home / Self Care | Payer: Medicare Other | Attending: Emergency Medicine | Admitting: Emergency Medicine

## 2017-03-22 DIAGNOSIS — Z87891 Personal history of nicotine dependence: Secondary | ICD-10-CM | POA: Diagnosis not present

## 2017-03-22 DIAGNOSIS — R55 Syncope and collapse: Secondary | ICD-10-CM

## 2017-03-22 DIAGNOSIS — I10 Essential (primary) hypertension: Secondary | ICD-10-CM | POA: Diagnosis not present

## 2017-03-22 DIAGNOSIS — E860 Dehydration: Secondary | ICD-10-CM

## 2017-03-22 DIAGNOSIS — Z79899 Other long term (current) drug therapy: Secondary | ICD-10-CM | POA: Insufficient documentation

## 2017-03-22 LAB — COMPREHENSIVE METABOLIC PANEL
ALT: 16 U/L (ref 14–54)
AST: 23 U/L (ref 15–41)
Albumin: 4.3 g/dL (ref 3.5–5.0)
Alkaline Phosphatase: 81 U/L (ref 38–126)
Anion gap: 11 (ref 5–15)
BUN: 12 mg/dL (ref 6–20)
CO2: 25 mmol/L (ref 22–32)
Calcium: 9.5 mg/dL (ref 8.9–10.3)
Chloride: 103 mmol/L (ref 101–111)
Creatinine, Ser: 0.75 mg/dL (ref 0.44–1.00)
GFR calc Af Amer: >60 mL/min (ref >60)
GFR calc non Af Amer: >60 mL/min (ref >60)
Glucose, Bld: 96 mg/dL (ref 65–99)
Potassium: 3.7 mmol/L (ref 3.5–5.1)
Sodium: 139 mmol/L (ref 135–145)
Total Bilirubin: 0.4 mg/dL (ref 0.3–1.2)
Total Protein: 7.4 g/dL (ref 6.5–8.1)

## 2017-03-22 LAB — URINALYSIS, ROUTINE W REFLEX MICROSCOPIC
Bilirubin Urine: NEGATIVE
Glucose, UA: NEGATIVE mg/dL
Hgb urine dipstick: NEGATIVE
Ketones, ur: NEGATIVE mg/dL
Leukocytes, UA: NEGATIVE
Nitrite: NEGATIVE
Protein, ur: NEGATIVE mg/dL
Specific Gravity, Urine: 1.014 (ref 1.005–1.030)
pH: 7 (ref 5.0–8.0)

## 2017-03-22 LAB — CBC WITH DIFFERENTIAL/PLATELET
Basophils Absolute: 0 10*3/uL (ref 0.0–0.1)
Basophils Relative: 0 %
Eosinophils Absolute: 0.4 10*3/uL (ref 0.0–0.7)
Eosinophils Relative: 3 %
HCT: 38.9 % (ref 36.0–46.0)
Hemoglobin: 12.9 g/dL (ref 12.0–15.0)
Lymphocytes Relative: 26 %
Lymphs Abs: 2.6 10*3/uL (ref 0.7–4.0)
MCH: 29.1 pg (ref 26.0–34.0)
MCHC: 33.2 g/dL (ref 30.0–36.0)
MCV: 87.8 fL (ref 78.0–100.0)
Monocytes Absolute: 0.7 10*3/uL (ref 0.1–1.0)
Monocytes Relative: 7 %
Neutro Abs: 6.5 10*3/uL (ref 1.7–7.7)
Neutrophils Relative %: 64 %
Platelets: 289 10*3/uL (ref 150–400)
RBC: 4.43 MIL/uL (ref 3.87–5.11)
RDW: 13.1 % (ref 11.5–15.5)
WBC: 10.2 10*3/uL (ref 4.0–10.5)

## 2017-03-22 LAB — PREGNANCY, URINE: Preg Test, Ur: NEGATIVE

## 2017-03-22 LAB — I-STAT TROPONIN, ED: Troponin i, poc: 0 ng/mL (ref 0.00–0.08)

## 2017-03-22 MED ORDER — SODIUM CHLORIDE 0.9 % IV BOLUS (SEPSIS)
500.0000 mL | Freq: Once | INTRAVENOUS | Status: AC
  Administered 2017-03-22: 500 mL via INTRAVENOUS

## 2017-03-22 MED ORDER — LORAZEPAM 2 MG/ML IJ SOLN
0.5000 mg | Freq: Once | INTRAMUSCULAR | Status: AC
  Administered 2017-03-22: 0.5 mg via INTRAVENOUS
  Filled 2017-03-22: qty 1

## 2017-03-22 NOTE — ED Triage Notes (Signed)
Pt presents by EMS for evaluation of near syncope that occurred appx 1 hr prior to EMS arrival. Pt currently alert and oriented x 4 and ambulatory. EMS advised EKG was unremarkable.
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
## 2017-03-22 NOTE — ED Notes (Signed)
Bed: [id number] Expected date:  Expected time:  Means of arrival:  Comments: 36 yo F/Syncope-chest pain

## 2017-05-02 ENCOUNTER — Encounter (HOSPITAL_COMMUNITY): Payer: Self-pay

## 2017-05-02 ENCOUNTER — Ambulatory Visit (HOSPITAL_COMMUNITY)
Admission: EM | Admit: 2017-05-02 | Discharge: 2017-05-02 | Disposition: A | Discharge status: Home / Self Care | Payer: Medicare Other | Attending: Physician Assistant | Admitting: Physician Assistant

## 2017-05-02 DIAGNOSIS — M545 Low back pain: Secondary | ICD-10-CM

## 2017-05-02 DIAGNOSIS — J00 Acute nasopharyngitis [common cold]: Secondary | ICD-10-CM | POA: Diagnosis not present

## 2017-05-02 DIAGNOSIS — R300 Dysuria: Secondary | ICD-10-CM | POA: Diagnosis not present

## 2017-05-02 LAB — POCT URINALYSIS DIP (DEVICE)
Bilirubin Urine: NEGATIVE
Glucose, UA: NEGATIVE mg/dL
Ketones, ur: NEGATIVE mg/dL
Leukocytes, UA: NEGATIVE
Nitrite: NEGATIVE
Protein, ur: NEGATIVE mg/dL
Specific Gravity, Urine: 1.01 (ref 1.005–1.030)
Urobilinogen, UA: 0.2 mg/dL (ref 0.0–1.0)
pH: 7 (ref 5.0–8.0)

## 2017-05-02 MED ORDER — IPRATROPIUM BROMIDE 0.06 % NA SOLN: 2.0000 | Freq: Four times a day (QID) | NASAL | 0 refills | Status: DC

## 2017-05-02 MED ORDER — ONDANSETRON HCL 4 MG PO TABS: 4.0000 mg | ORAL_TABLET | Freq: Four times a day (QID) | ORAL | 0 refills | Status: DC
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
## 2017-05-02 NOTE — ED Triage Notes (Signed)
[facility], pt also complains of mucus drainage x4 days.

## 2017-05-02 NOTE — Discharge Instructions (Signed)
Start atrovent nasal spray for nasal congestion. Continue sudafed and allergy medicine daily. You can use over the counter nasal saline rinse such as neti pot for nasal congestion. Keep hydrated, your urine should be clear to pale yellow in color. Tylenol/motrin for fever and pain. Monitor for any worsening of symptoms, chest pain, shortness of breath, wheezing, swelling of the throat, follow up for reevaluation.   Urine negative for UTI. Symptoms could be due to your interstitial cystitis. Start zofran to help with nausea. Follow up with urologist for further evaluation of painful urination. Keep hydrated, your urine should be clear to pale yellow in color. Monitor for any worsening of symptoms, nausea or vomiting not controlled by medication, worsening abdominal pain, fever, weakness, dizziness, go to the emergency department for further evaluation.

## 2017-06-05 ENCOUNTER — Ambulatory Visit (HOSPITAL_COMMUNITY)
Admission: EM | Admit: 2017-06-05 | Discharge: 2017-06-05 | Disposition: A | Discharge status: Home / Self Care | Payer: Medicare Other | Attending: Family Medicine | Admitting: Family Medicine

## 2017-06-05 ENCOUNTER — Other Ambulatory Visit: Payer: Self-pay

## 2017-06-05 ENCOUNTER — Encounter (HOSPITAL_COMMUNITY): Payer: Self-pay | Admitting: Emergency Medicine

## 2017-06-05 DIAGNOSIS — J00 Acute nasopharyngitis [common cold]: Secondary | ICD-10-CM | POA: Diagnosis not present

## 2017-06-05 DIAGNOSIS — R0981 Nasal congestion: Secondary | ICD-10-CM

## 2017-06-05 MED ORDER — FLUTICASONE PROPIONATE 50 MCG/ACT NA SUSP: 1.0000 | Freq: Every day | NASAL | 0 refills | Status: DC

## 2017-06-05 MED ORDER — SALINE SPRAY 0.65 % NA SOLN: 1.0000 | NASAL | 0 refills | Status: DC | PRN
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
## 2017-06-05 NOTE — ED Triage Notes (Signed)
Pt states she was around family members who had cold/sinus infection, pt states she feels like she got it. sneezing

## 2017-06-05 NOTE — Discharge Instructions (Signed)
Push fluids to ensure adequate hydration and keep secretions thin.  Tylenol and/or ibuprofen as needed for pain or fevers.  Daily flonase may help with symptoms. May try use of nasal saline as well.

## 2017-08-21 ENCOUNTER — Encounter (HOSPITAL_COMMUNITY): Payer: Self-pay | Admitting: Emergency Medicine

## 2017-08-21 ENCOUNTER — Ambulatory Visit (HOSPITAL_COMMUNITY)
Admission: EM | Admit: 2017-08-21 | Discharge: 2017-08-21 | Disposition: A | Discharge status: Home / Self Care | Payer: Medicare Other | Attending: Physician Assistant | Admitting: Physician Assistant

## 2017-08-21 ENCOUNTER — Other Ambulatory Visit: Payer: Self-pay

## 2017-08-21 DIAGNOSIS — Z87891 Personal history of nicotine dependence: Secondary | ICD-10-CM | POA: Diagnosis not present

## 2017-08-21 DIAGNOSIS — F419 Anxiety disorder, unspecified: Secondary | ICD-10-CM | POA: Diagnosis not present

## 2017-08-21 DIAGNOSIS — N643 Galactorrhea not associated with childbirth: Secondary | ICD-10-CM | POA: Diagnosis not present

## 2017-08-21 DIAGNOSIS — G8929 Other chronic pain: Secondary | ICD-10-CM | POA: Insufficient documentation

## 2017-08-21 DIAGNOSIS — Z3202 Encounter for pregnancy test, result negative: Secondary | ICD-10-CM | POA: Diagnosis not present

## 2017-08-21 DIAGNOSIS — I10 Essential (primary) hypertension: Secondary | ICD-10-CM | POA: Diagnosis not present

## 2017-08-21 DIAGNOSIS — Z90710 Acquired absence of both cervix and uterus: Secondary | ICD-10-CM | POA: Insufficient documentation

## 2017-08-21 DIAGNOSIS — N3010 Interstitial cystitis (chronic) without hematuria: Secondary | ICD-10-CM | POA: Diagnosis not present

## 2017-08-21 DIAGNOSIS — K219 Gastro-esophageal reflux disease without esophagitis: Secondary | ICD-10-CM | POA: Insufficient documentation

## 2017-08-21 DIAGNOSIS — R350 Frequency of micturition: Secondary | ICD-10-CM

## 2017-08-21 DIAGNOSIS — Z9889 Other specified postprocedural states: Secondary | ICD-10-CM | POA: Insufficient documentation

## 2017-08-21 DIAGNOSIS — F319 Bipolar disorder, unspecified: Secondary | ICD-10-CM | POA: Insufficient documentation

## 2017-08-21 DIAGNOSIS — R3915 Urgency of urination: Secondary | ICD-10-CM | POA: Diagnosis present

## 2017-08-21 DIAGNOSIS — R300 Dysuria: Secondary | ICD-10-CM

## 2017-08-21 LAB — POCT URINALYSIS DIP (DEVICE)
Bilirubin Urine: NEGATIVE
Glucose, UA: NEGATIVE mg/dL
Hgb urine dipstick: NEGATIVE
Ketones, ur: NEGATIVE mg/dL
Leukocytes, UA: NEGATIVE
Nitrite: NEGATIVE
Protein, ur: NEGATIVE mg/dL
Specific Gravity, Urine: 1.01 (ref 1.005–1.030)
Urobilinogen, UA: 0.2 mg/dL (ref 0.0–1.0)
pH: 6.5 (ref 5.0–8.0)

## 2017-08-21 LAB — POCT PREGNANCY, URINE: Preg Test, Ur: NEGATIVE

## 2017-08-21 MED ORDER — NITROFURANTOIN MONOHYD MACRO 100 MG PO CAPS: 100.0000 mg | ORAL_CAPSULE | Freq: Two times a day (BID) | ORAL | 0 refills | Status: DC

## 2017-08-21 NOTE — ED Triage Notes (Signed)
History of uti.  Onset 5 days ago of symptoms.  Complains of back pain, stinging with urination, and pelvic pressure, and overall feeling "blah"

## 2017-08-21 NOTE — Discharge Instructions (Signed)
Start taking the nitrofurantoin today as prescribed.  We will call in a few days with regards to the results of your urine culture.  I would suggest getting an appointment with your primary care provider in the event that you do not get better from antibiotics as this may be interstitial cystitis.

## 2017-08-21 NOTE — ED Provider Notes (Addendum)
08/21/2017 2:55 PM   DOB: Mar 02, 1981 / MRN: [id number]  SUBJECTIVE:  [name] is a 37 y.o. female presenting for dysuria and urinary urgency that started about 5 days ago.  She denies hematuria, flank pain, nausea, dizziness, abdominal pain.  She has a history of IBS as well as interstitial cystitis.  She is moving her bowels normally for her.  States her interstitial cystitis resolved with abdominal hysterectomy in the past.  She denies fever.  She denies vaginal pain, irritation, discharge, irritation.  She is allergic to gabapentin; hydrocodone; methadone; augmentin [amoxicillin-pot clavulanate]; azithromycin; buprenorphine; ciprofloxacin; erythromycin; garlic; hydrocodone-acetaminophen; methenamine; morphine and related; onion; prozac [fluoxetine hcl]; pyridium [phenazopyridine hcl]; and uribel [meth-hyo-m bl-na phos-ph sal].   She  has a past medical history of Anxiety, Bipolar 1 disorder (HCC), Bipolar 1 disorder (HCC), Chronic back pain, Galactorrhea, GERD (gastroesophageal reflux disease), Hypertension, IBS (irritable bowel syndrome), IC (interstitial cystitis), Insomnia, Interstitial cystitis, Mental disorder, Migraine, Seasonal allergies, and Sinusitis.    She  reports that she quit smoking about 16 years ago. Her smoking use included cigars. She quit after 2.50 years of use. she has never used smokeless tobacco. She reports that she drinks alcohol. She reports that she does not use drugs. She  reports that she does not currently engage in sexual activity. The patient  has a past surgical history that includes Tubal ligation; Bunionectomy; Interstim Implant placement; Bowel resection (1998); Foot surgery; Bladder surgery; laparoscopy (Right, 06/13/2013); laparotomy (Right, 06/13/2013); Intrauterine device insertion (2 yrs ago); and Abdominal hysterectomy.  Her family history includes Anxiety disorder in her maternal grandfather; Depression in her maternal grandfather; Diabetes in her father  and maternal grandfather; Esophageal cancer in her maternal grandmother; Heart disease in her father, maternal grandfather, maternal grandmother, and other; Hypertension in her maternal grandfather, maternal grandmother, and mother; Stomach cancer in her maternal grandmother; Stroke in her father and maternal grandfather.  ROS  As per HPI otherwise negative  OBJECTIVE:  BP 120/82 (BP Location: Left Arm)   Pulse 69   Temp 98.3 F (36.8 C) (Oral)   Resp 18   LMP 09/27/2013   SpO2 97%   Physical Exam  Constitutional: She is active.  Non-toxic appearance.  Cardiovascular: Normal rate, regular rhythm, S1 normal, S2 normal, normal heart sounds and intact distal pulses. Exam reveals no gallop, no friction rub and no decreased pulses.  No murmur heard. Pulmonary/Chest: Effort normal. No stridor. No tachypnea. No respiratory distress. She has no wheezes. She has no rales.  Abdominal: Soft. Bowel sounds are normal. She exhibits no distension and no mass. There is no tenderness. There is no rigidity, no rebound, no guarding and no CVA tenderness.  Musculoskeletal: She exhibits no edema.  Neurological: She is alert.  Skin: Skin is warm and dry. She is not diaphoretic. No pallor.    Results for orders placed or performed during the hospital encounter of 08/21/17 (from the past 72 hour(s))  POCT urinalysis dip (device)     Status: None   Collection Time: 08/21/17  2:22 PM  Result Value Ref Range   Glucose, UA NEGATIVE NEGATIVE mg/dL   Bilirubin Urine NEGATIVE NEGATIVE   Ketones, ur NEGATIVE NEGATIVE mg/dL   Specific Gravity, Urine 1.010 1.005 - 1.030   Hgb urine dipstick NEGATIVE NEGATIVE   pH 6.5 5.0 - 8.0   Protein, ur NEGATIVE NEGATIVE mg/dL   Urobilinogen, UA 0.2 0.0 - 1.0 mg/dL   Nitrite NEGATIVE NEGATIVE   Leukocytes, UA NEGATIVE NEGATIVE  Comment: Biochemical Testing Only. Please order routine urinalysis from main lab if confirmatory testing is needed.  Pregnancy, urine POC      Status: None   Collection Time: 08/21/17  2:29 PM  Result Value Ref Range   Preg Test, Ur NEGATIVE NEGATIVE    Comment:        THE SENSITIVITY OF THIS METHODOLOGY IS >24 mIU/mL     No results found.  ASSESSMENT AND PLAN:  Orders Placed This Encounter  Procedures  . POCT urinalysis dip (device)    Standing Status:   Standing    Number of Occurrences:   1  . Pregnancy, urine POC    Standing Status:   Standing    Number of Occurrences:   1     Dysuria: Her urine is normal however her symptoms are consistent with a UTI.  We will try her on nitrofurantoin.  She has had this in the past and there were no side effects.  She will follow-up with her primary if she does not improve.  Culture is out.      The patient is advised to call or return to clinic if she does not see an improvement in symptoms, or to seek the care of the closest emergency department if she worsens with the above plan.   Deliah BostonMichael Tinzlee Craker, MHS, PA-C 08/21/2017 2:55 PM   Ofilia Neaslark, Koren Sermersheim L, PA-C 08/21/17 1457    Ofilia Neaslark, Tiyana Galla L, PA-C 08/21/17 1458

## 2017-08-22 LAB — URINE CULTURE: Culture: <10000 — AB

## 2017-11-17 ENCOUNTER — Other Ambulatory Visit (HOSPITAL_COMMUNITY)
Admission: RE | Admit: 2017-11-17 | Discharge: 2017-11-17 | Disposition: A | Discharge status: Home / Self Care | Payer: Medicare Other | Source: Ambulatory Visit | Attending: Obstetrics and Gynecology | Admitting: Obstetrics and Gynecology

## 2017-11-17 ENCOUNTER — Ambulatory Visit (INDEPENDENT_AMBULATORY_CARE_PROVIDER_SITE_OTHER): Payer: Medicare Other | Admitting: Obstetrics and Gynecology

## 2017-11-17 ENCOUNTER — Encounter: Payer: Self-pay | Admitting: Obstetrics and Gynecology

## 2017-11-17 VITALS — BP 130/79 | HR 120 | Ht 61.5 in | Wt 159.2 lb

## 2017-11-17 DIAGNOSIS — Z87891 Personal history of nicotine dependence: Secondary | ICD-10-CM | POA: Diagnosis not present

## 2017-11-17 DIAGNOSIS — K589 Irritable bowel syndrome without diarrhea: Secondary | ICD-10-CM | POA: Diagnosis not present

## 2017-11-17 DIAGNOSIS — G4700 Insomnia, unspecified: Secondary | ICD-10-CM | POA: Insufficient documentation

## 2017-11-17 DIAGNOSIS — F419 Anxiety disorder, unspecified: Secondary | ICD-10-CM | POA: Diagnosis not present

## 2017-11-17 DIAGNOSIS — T7421XS Adult sexual abuse, confirmed, sequela: Secondary | ICD-10-CM | POA: Diagnosis not present

## 2017-11-17 DIAGNOSIS — G43909 Migraine, unspecified, not intractable, without status migrainosus: Secondary | ICD-10-CM | POA: Diagnosis not present

## 2017-11-17 DIAGNOSIS — K219 Gastro-esophageal reflux disease without esophagitis: Secondary | ICD-10-CM | POA: Diagnosis not present

## 2017-11-17 DIAGNOSIS — Z7951 Long term (current) use of inhaled steroids: Secondary | ICD-10-CM | POA: Insufficient documentation

## 2017-11-17 DIAGNOSIS — X58XXXS Exposure to other specified factors, sequela: Secondary | ICD-10-CM | POA: Diagnosis not present

## 2017-11-17 DIAGNOSIS — R232 Flushing: Secondary | ICD-10-CM

## 2017-11-17 DIAGNOSIS — Z0000 Encounter for general adult medical examination without abnormal findings: Secondary | ICD-10-CM

## 2017-11-17 DIAGNOSIS — Z113 Encounter for screening for infections with a predominantly sexual mode of transmission: Secondary | ICD-10-CM

## 2017-11-17 DIAGNOSIS — F319 Bipolar disorder, unspecified: Secondary | ICD-10-CM | POA: Diagnosis not present

## 2017-11-17 DIAGNOSIS — R300 Dysuria: Secondary | ICD-10-CM | POA: Diagnosis present

## 2017-11-17 DIAGNOSIS — I10 Essential (primary) hypertension: Secondary | ICD-10-CM | POA: Insufficient documentation
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
## 2017-11-17 NOTE — Progress Notes (Signed)
rine Pt c/o hot flashes, night sweats, HA's and requests to start Hormone therapy. Pt also c/o discomfort while voiding and vaginal odor.

## 2017-11-17 NOTE — Patient Instructions (Signed)
divigel

## 2017-11-18 ENCOUNTER — Telehealth: Payer: Self-pay | Admitting: Clinical

## 2017-11-18 LAB — HEPATITIS C ANTIBODY: Hep C Virus Ab: 0.2 s/co ratio (ref 0.0–0.9)

## 2017-11-18 LAB — CERVICOVAGINAL ANCILLARY ONLY
Bacterial vaginitis: NEGATIVE
Candida vaginitis: NEGATIVE
Chlamydia: NEGATIVE
Neisseria Gonorrhea: NEGATIVE
Trichomonas: NEGATIVE

## 2017-11-18 LAB — RPR: RPR Ser Ql: NONREACTIVE

## 2017-11-18 LAB — HEPATITIS B SURFACE ANTIGEN: Hepatitis B Surface Ag: NEGATIVE

## 2017-11-18 MED ORDER — ESTRADIOL 0.075 MG/24HR TD PTWK: 0.0750 mg | MEDICATED_PATCH | TRANSDERMAL | 5 refills | Status: DC

## 2017-11-18 NOTE — Telephone Encounter (Signed)
Attempt to contact pt, as recommended by Dr. Earlene Plater; no voicemail set up, so no message left.

## 2017-11-20 LAB — URINE CULTURE

## 2017-11-28 ENCOUNTER — Telehealth: Payer: Self-pay

## 2017-11-28 NOTE — Telephone Encounter (Signed)
TC from pt mother on her behalf stating that recent Rx Patch is causing severe itching and now small bumps have formed under the patch   Pt is requesting change in Rx to hormone pills instead please advise.

## 2017-11-30 ENCOUNTER — Telehealth: Payer: Self-pay

## 2017-11-30 NOTE — Telephone Encounter (Signed)
Return TC to pt  Pt mother called again today requesting Rx medication change off the patch and wants hormone pills instead.  Pt states she did try to take the patch off due to skin irritation and bumps but due to other sx's (sweats) sha still continues to use them .  Please advise.

## 2017-12-01 ENCOUNTER — Other Ambulatory Visit: Payer: Self-pay

## 2017-12-01 DIAGNOSIS — R232 Flushing: Secondary | ICD-10-CM

## 2017-12-01 MED ORDER — ESTRADIOL 0.5 MG/0.5GM TD GEL: TRANSDERMAL | 11 refills | Status: DC

## 2017-12-01 NOTE — Progress Notes (Signed)
Consulted w/Dr.Harper regarding pt Rxrequest change.  Rx called in to requested pharmacy  Pt mother came in the office about request.

## 2017-12-12 ENCOUNTER — Other Ambulatory Visit: Payer: Self-pay | Admitting: Obstetrics

## 2017-12-12 ENCOUNTER — Other Ambulatory Visit: Payer: Self-pay | Admitting: *Deleted

## 2017-12-12 DIAGNOSIS — E8941 Symptomatic postprocedural ovarian failure: Secondary | ICD-10-CM

## 2017-12-12 DIAGNOSIS — R232 Flushing: Secondary | ICD-10-CM

## 2017-12-12 MED ORDER — ESTRADIOL 0.5 MG/0.5GM TD GEL: TRANSDERMAL | 11 refills | Status: DC

## 2017-12-12 MED ORDER — ESTRADIOL 0.1 MG/24HR TD PTTW: 1.0000 | MEDICATED_PATCH | TRANSDERMAL | 12 refills | Status: DC

## 2017-12-12 NOTE — Progress Notes (Signed)
Pt called to office stating that [facility] did not have her Rx that was sent for Gel. Pt was listed as "phone in" on order. Rx was reordered at "normal" in order for escribe.

## 2017-12-13 ENCOUNTER — Other Ambulatory Visit: Payer: Self-pay | Admitting: Obstetrics

## 2017-12-13 DIAGNOSIS — E8941 Symptomatic postprocedural ovarian failure: Secondary | ICD-10-CM

## 2017-12-13 MED ORDER — ESTRADIOL 0.1 MG/24HR TD PTTW: 1.0000 | MEDICATED_PATCH | TRANSDERMAL | 5 refills | Status: DC

## 2017-12-15 ENCOUNTER — Other Ambulatory Visit: Payer: Self-pay | Admitting: *Deleted

## 2017-12-15 ENCOUNTER — Other Ambulatory Visit: Payer: Self-pay | Admitting: Obstetrics

## 2017-12-15 DIAGNOSIS — E8941 Symptomatic postprocedural ovarian failure: Secondary | ICD-10-CM

## 2017-12-15 MED ORDER — ESTRADIOL 2 MG PO TABS: 2.0000 mg | ORAL_TABLET | Freq: Every day | ORAL | 6 refills | Status: DC

## 2017-12-15 NOTE — Progress Notes (Signed)
Pt mother called to office stating that previous gel Rx was not covered by ins. States pharmacy says there is a cream that is covered. Pt would like to take oral pill if possible.  Call placed to pharmacy to help verify what Rx's are covered with her ins. Pt may either choose vaginal cream or oral tablet.  Spoke with pt mother, pt does not/ will not use vaginal cream/insert. Chooses oral medication.   Per Dr Clearance CootsHarper, Estrace 2mg  tablets sent to pharmacy. Pt made aware to follow up in 6 months or sooner as needed.

## 2018-01-04 ENCOUNTER — Ambulatory Visit (HOSPITAL_COMMUNITY)
Admission: EM | Admit: 2018-01-04 | Discharge: 2018-01-04 | Disposition: A | Discharge status: Home / Self Care | Payer: Medicare Other | Attending: Family Medicine | Admitting: Family Medicine

## 2018-01-04 ENCOUNTER — Ambulatory Visit (INDEPENDENT_AMBULATORY_CARE_PROVIDER_SITE_OTHER): Payer: Medicare Other

## 2018-01-04 ENCOUNTER — Encounter (HOSPITAL_COMMUNITY): Payer: Self-pay

## 2018-01-04 DIAGNOSIS — M79671 Pain in right foot: Secondary | ICD-10-CM

## 2018-01-04 NOTE — ED Triage Notes (Signed)
Pt presents with possible glass in right foot

## 2018-01-04 NOTE — ED Provider Notes (Signed)
[facility]   [id number] 01/04/18 Arrival Time: 1933  ASSESSMENT & PLAN:  1. Right foot pain    Imaging: Dg Foot Complete Right  Result Date: 01/04/2018 CLINICAL DATA:  Puncture injury to the right foot. EXAM: RIGHT FOOT COMPLETE - 3+ VIEW COMPARISON:  None. FINDINGS: There is no evidence of fracture or dislocation. There is chronic deformity of the fifth metatarsal. Soft tissues are unremarkable. IMPRESSION: No acute abnormality.  No radiopaque foreign body. Electronically Signed   By: Sherian Rein M.D.   On: 01/04/2018 20:34   Doubt foreign body but possibility exists. No exploration desired. She will watch and f/u with general surgery or podiatry if she wishes.  Reviewed expectations re: course of current medical issues. Questions answered. Outlined signs and symptoms indicating need for more acute intervention. Patient verbalized understanding. After Visit Summary given.  SUBJECTIVE: History from: patient. [name] is a 37 y.o. female who noticed a healing small wound on the bottom of her R foot. Questions stepping on something a few days ago. Minimal discomfort with weight bearing. No bleeding noted. No specific relieving factors. No swelling of area. Associated symptoms: none reported. Extremity sensation changes or weakness: none. Self treatment: none.  ROS: As per HPI.   OBJECTIVE:  Vitals:   01/04/18 1949  BP: 135/85  Pulse: 86  Resp: 18  Temp: 98 F (36.7 C)  SpO2: 100%    General appearance: alert; no distress Extremities: warm and well perfused; symmetrical with no gross deformities; mid plantar right foot with very small healing cut; no induration; non-tender; I cannot feel anything on palpation CV: normal extremity capillary refill Skin: warm and dry Neurologic: normal gait; normal symmetric reflexes in all extremities; normal sensation in all extremities Psychological: alert and cooperative; normal mood and affect  Allergies    Allergen Reactions  . Gabapentin Other (See Comments)    Reaction:  Suicidal thoughts   . Hydrocodone Shortness Of Breath  . Methadone Other (See Comments)    Confusion/fainting  . Augmentin [Amoxicillin-Pot Clavulanate] Nausea And Vomiting and Other (See Comments)    Has patient had a PCN reaction causing immediate rash, facial/tongue/throat swelling, SOB or lightheadedness with hypotension: No Has patient had a PCN reaction causing severe rash involving mucus membranes or skin necrosis: No Has patient had a PCN reaction that required hospitalization No Has patient had a PCN reaction occurring within the last 10 years: No If all of the above answers are "NO", then may proceed with Cephalosporin use.  . Azithromycin Other (See Comments)    Pt states that this medication does not work for her.    . Buprenorphine Itching  . Ciprofloxacin Nausea Only  . Erythromycin Nausea And Vomiting  . Garlic Other (See Comments)    Reaction:  Bladder issues   . Hydrocodone-Acetaminophen Other (See Comments)    Pt states that this medication made her pass out.    . Methenamine Other (See Comments)    Reaction:  Urinary retention   . Morphine And Related Itching  . Onion Nausea And Vomiting  . Prozac [Fluoxetine Hcl] Other (See Comments)    Reaction:  Suicidal thoughts   . Pyridium [Phenazopyridine Hcl] Other (See Comments)    Reaction:  Bladder pain   . Uribel [Meth-Hyo-M Bl-Na Phos-Ph Sal] Other (See Comments)    Reaction:  Bladder issues     Past Medical History:  Diagnosis Date  . Anxiety   . Bipolar 1 disorder (HCC)   . Bipolar 1  disorder (HCC)   . Chronic back pain   . Galactorrhea   . GERD (gastroesophageal reflux disease)   . Hypertension   . IBS (irritable bowel syndrome)   . IC (interstitial cystitis)   . Insomnia   . Interstitial cystitis   . Mental disorder   . Migraine   . Seasonal allergies   . Sinusitis    Social History   Socioeconomic History  . Marital status:  Single    Spouse name: Not on file  . Number of children: 0  . Years of education: 3912  . Highest education level: Not on file  Occupational History  . Occupation: disability  Social Needs  . Financial resource strain: Not on file  . Food insecurity:    Worry: Not on file    Inability: Not on file  . Transportation needs:    Medical: Not on file    Non-medical: Not on file  Tobacco Use  . Smoking status: Former Smoker    Years: 2.50    Types: Cigars    Last attempt to quit: 06/06/2001    Years since quitting: 16.5  . Smokeless tobacco: Never Used  Substance and Sexual Activity  . Alcohol use: Yes    Comment: a few sips of wine   . Drug use: No    Types: Hydrocodone    Comment: tramadol prior to surgery on dec 3rd  . Sexual activity: Not Currently    Comment: over a year ago   Lifestyle  . Physical activity:    Days per week: Not on file    Minutes per session: Not on file  . Stress: Not on file  Relationships  . Social connections:    Talks on phone: Not on file    Gets together: Not on file    Attends religious service: Not on file    Active member of club or organization: Not on file    Attends meetings of clubs or organizations: Not on file    Relationship status: Not on file  . Intimate partner violence:    Fear of current or ex partner: Not on file    Emotionally abused: Not on file    Physically abused: Not on file    Forced sexual activity: Not on file  Other Topics Concern  . Not on file  Social History Narrative   Patient is single.   Patient lives alone   Patient is not working.   Patient has high school education.   Patient has no children.         Family History  Problem Relation Age of Onset  . Hypertension Mother   . Diabetes Father   . Heart disease Father   . Stroke Father   . Esophageal cancer Maternal Grandmother   . Hypertension Maternal Grandmother   . Heart disease Maternal Grandmother   . Stomach cancer Maternal Grandmother   .  Diabetes Maternal Grandfather   . Stroke Maternal Grandfather   . Depression Maternal Grandfather   . Anxiety disorder Maternal Grandfather   . Hypertension Maternal Grandfather   . Heart disease Maternal Grandfather   . Heart disease Other    Past Surgical History:  Procedure Laterality Date  . ABDOMINAL HYSTERECTOMY  10/16/2013  . BLADDER SURGERY     distenstion numerous times  . BOWEL RESECTION  1998   prolapsed bowel  . BUNIONECTOMY    . FOOT SURGERY     bunionectomy bil. pins put in and taken out  .  INTERSTIM IMPLANT PLACEMENT    . INTRAUTERINE DEVICE INSERTION  2 yrs ago   Mirena  . LAPAROSCOPY Right 06/13/2013   Procedure: LAPAROSCOPY OPERATIVE WITH OVARIAN CYSTECTOMY convert to open 1329;  Surgeon: Purcell Nails, MD;  Location: [facility];  Service: Gynecology;  Laterality: Right;  . LAPAROTOMY Right 06/13/2013   Procedure: EXPLORATORY LAPAROTOMY, ovarian cyst;  Surgeon: Purcell Nails, MD;  Location: [facility];  Service: Gynecology;  Laterality: Right;  . TUBAL LIGATION        Mardella Layman, MD 01/05/18 [phone]

## 2018-01-07 ENCOUNTER — Other Ambulatory Visit: Payer: Self-pay | Admitting: Obstetrics

## 2018-01-07 DIAGNOSIS — E8941 Symptomatic postprocedural ovarian failure: Secondary | ICD-10-CM

## 2018-06-16 ENCOUNTER — Emergency Department (HOSPITAL_COMMUNITY)
Admission: EM | Admit: 2018-06-16 | Discharge: 2018-06-16 | Disposition: A | Discharge status: Home / Self Care | Payer: Medicare Other | Attending: Emergency Medicine | Admitting: Emergency Medicine

## 2018-06-16 ENCOUNTER — Encounter (HOSPITAL_COMMUNITY): Payer: Self-pay | Admitting: Emergency Medicine

## 2018-06-16 ENCOUNTER — Emergency Department (HOSPITAL_COMMUNITY): Payer: Medicare Other

## 2018-06-16 DIAGNOSIS — Z87891 Personal history of nicotine dependence: Secondary | ICD-10-CM | POA: Diagnosis not present

## 2018-06-16 DIAGNOSIS — R0789 Other chest pain: Secondary | ICD-10-CM | POA: Insufficient documentation

## 2018-06-16 DIAGNOSIS — Z79899 Other long term (current) drug therapy: Secondary | ICD-10-CM | POA: Diagnosis not present

## 2018-06-16 DIAGNOSIS — I10 Essential (primary) hypertension: Secondary | ICD-10-CM | POA: Insufficient documentation

## 2018-06-16 DIAGNOSIS — R079 Chest pain, unspecified: Secondary | ICD-10-CM | POA: Diagnosis present

## 2018-06-16 LAB — CBC WITH DIFFERENTIAL/PLATELET
Abs Immature Granulocytes: 0.01 10*3/uL (ref 0.00–0.07)
Basophils Absolute: 0 10*3/uL (ref 0.0–0.1)
Basophils Relative: 1 %
Eosinophils Absolute: 0.2 10*3/uL (ref 0.0–0.5)
Eosinophils Relative: 3 %
HCT: 40.9 % (ref 36.0–46.0)
Hemoglobin: 13.2 g/dL (ref 12.0–15.0)
Immature Granulocytes: 0 %
Lymphocytes Relative: 27 %
Lymphs Abs: 1.5 10*3/uL (ref 0.7–4.0)
MCH: 29.7 pg (ref 26.0–34.0)
MCHC: 32.3 g/dL (ref 30.0–36.0)
MCV: 92.1 fL (ref 80.0–100.0)
Monocytes Absolute: 0.4 10*3/uL (ref 0.1–1.0)
Monocytes Relative: 7 %
Neutro Abs: 3.5 10*3/uL (ref 1.7–7.7)
Neutrophils Relative %: 62 %
Platelets: 281 10*3/uL (ref 150–400)
RBC: 4.44 MIL/uL (ref 3.87–5.11)
RDW: 12.1 % (ref 11.5–15.5)
WBC: 5.7 10*3/uL (ref 4.0–10.5)
nRBC: 0 % (ref 0.0–0.2)

## 2018-06-16 LAB — BASIC METABOLIC PANEL
Anion gap: 10 (ref 5–15)
BUN: 8 mg/dL (ref 6–20)
CO2: 24 mmol/L (ref 22–32)
Calcium: 9.3 mg/dL (ref 8.9–10.3)
Chloride: 104 mmol/L (ref 98–111)
Creatinine, Ser: 1.09 mg/dL — ABNORMAL HIGH (ref 0.44–1.00)
GFR calc Af Amer: >60 mL/min (ref >60)
GFR calc non Af Amer: >60 mL/min (ref >60)
Glucose, Bld: 101 mg/dL — ABNORMAL HIGH (ref 70–99)
Potassium: 3.8 mmol/L (ref 3.5–5.1)
Sodium: 138 mmol/L (ref 135–145)

## 2018-06-16 LAB — I-STAT BETA HCG BLOOD, ED (MC, WL, AP ONLY): I-stat hCG, quantitative: <5 m[IU]/mL (ref <5)

## 2018-06-16 LAB — I-STAT TROPONIN, ED: Troponin i, poc: 0 ng/mL (ref 0.00–0.08)

## 2018-06-16 NOTE — ED Triage Notes (Signed)
Patient here via EMS with complaints of chest pain x3 days, increased with movement. Reports taking anxiety medication and feeling better. 325 aspirin given.
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
## 2018-06-16 NOTE — ED Notes (Signed)
Patient transported to X-ray 

## 2018-06-16 NOTE — Discharge Instructions (Addendum)
Please return for any problem.  Follow-up with your regular care provider as instructed. °

## 2018-08-15 ENCOUNTER — Ambulatory Visit (HOSPITAL_COMMUNITY)
Admission: EM | Admit: 2018-08-15 | Discharge: 2018-08-15 | Disposition: A | Discharge status: Home / Self Care | Payer: Medicare Other | Attending: Internal Medicine | Admitting: Internal Medicine

## 2018-08-15 ENCOUNTER — Encounter (HOSPITAL_COMMUNITY): Payer: Self-pay | Admitting: Emergency Medicine

## 2018-08-15 DIAGNOSIS — N3090 Cystitis, unspecified without hematuria: Secondary | ICD-10-CM | POA: Diagnosis not present

## 2018-08-15 LAB — POCT URINALYSIS DIP (DEVICE)
Bilirubin Urine: NEGATIVE
Glucose, UA: 100 mg/dL — AB
Ketones, ur: NEGATIVE mg/dL
Leukocytes, UA: NEGATIVE
Nitrite: POSITIVE — AB
Protein, ur: NEGATIVE mg/dL
Specific Gravity, Urine: 1.01 (ref 1.005–1.030)
Urobilinogen, UA: 1 mg/dL (ref 0.0–1.0)
pH: 5 (ref 5.0–8.0)
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
## 2018-08-15 NOTE — ED Triage Notes (Signed)
Pt c/o flank pain, back pain, urinary frequency, urgency, burning x4 days.

## 2018-09-05 ENCOUNTER — Ambulatory Visit (INDEPENDENT_AMBULATORY_CARE_PROVIDER_SITE_OTHER): Payer: Medicare Other | Admitting: Gynecology

## 2018-09-05 ENCOUNTER — Encounter: Payer: Self-pay | Admitting: Gynecology

## 2018-09-05 VITALS — BP 118/78 | Ht 62.0 in | Wt 149.0 lb

## 2018-09-05 DIAGNOSIS — R102 Pelvic and perineal pain: Secondary | ICD-10-CM

## 2018-09-05 DIAGNOSIS — Z1272 Encounter for screening for malignant neoplasm of vagina: Secondary | ICD-10-CM | POA: Diagnosis not present

## 2018-09-05 DIAGNOSIS — Z7251 High risk heterosexual behavior: Secondary | ICD-10-CM | POA: Diagnosis not present

## 2018-09-05 DIAGNOSIS — N809 Endometriosis, unspecified: Secondary | ICD-10-CM | POA: Diagnosis not present

## 2018-09-05 DIAGNOSIS — Z79890 Hormone replacement therapy (postmenopausal): Secondary | ICD-10-CM

## 2018-09-05 DIAGNOSIS — Z01411 Encounter for gynecological examination (general) (routine) with abnormal findings: Secondary | ICD-10-CM

## 2018-09-05 NOTE — Progress Notes (Signed)
[name] [birth year] [id number]        38 y.o.  G0P0 new patient who presents with a complex history.  Currently complaining of left-sided pain over the last 2 to 4 weeks.  Intermittent coming and going cramping to sharp stabbing.  Is having some issues with constipation.  Has a history of interstitial cystitis but reports that she does not feel she is having symptoms at this time.  Has a history of endometriosis with laparoscopic ovarian cystectomy 2014, TAH RSO 2015 with negative pathology, LSO 2017 with negative pathology and most recently 12/2016 laparoscopy for left-sided pain and a persistent 3.7 cm cyst with findings reported to be a "old blood filled cyst".  No pathology report could be obtained from epic.  Has done well until the last several weeks.  No urinary frequency dysuria urgency low back pain fever or chills.  Currently on Estrace 2 mg daily.  Past medical history,surgical history, problem list, medications, allergies, family history and social history were all reviewed and documented as reviewed in the EPIC chart.  ROS:  Performed with pertinent positives and negatives included in the history, assessment and plan.   Additional significant findings : None   Exam: [name] assistant Vitals:   09/05/18 1039  BP: 118/78  Weight: 149 lb (67.6 kg)  Height: 5\' 2"  (1.575 m)   Body mass index is 27.25 kg/m.  General appearance:  Normal affect, orientation and appearance. Skin: Grossly normal HEENT: Without gross lesions.  No cervical or supraclavicular adenopathy. Thyroid normal.  Lungs:  Clear without wheezing, rales or rhonchi Cardiac: RR, without RMG Abdominal:  Soft, nontender, without masses, guarding, rebound, organomegaly or hernia Breasts:  Examined lying and sitting without masses, retractions, discharge or axillary adenopathy. Pelvic:  Ext, BUS, Vagina: Normal.  Pap smear of cuff done  Adnexa: Without masses or tenderness    Anus and perineum: Normal    Rectovaginal: Normal sphincter tone without palpated masses or tenderness.    Assessment/Plan:  38 y.o. G0P0 female with left-sided pain for 2 to 4 weeks.  Having some constipation.  No overt urinary symptoms but does have a history of interstitial cystitis.  Status post TAH RSO, subsequent LSO, subsequent left peritoneal cyst removal with history of endometriosis although no pathology specimens demonstrated endometriosis.  Currently on Estrace 2 mg daily for hot flash control.  Exam is normal today.  We reviewed differential to include GYN versus non-GYN.  Options for evaluation discussed to include expectant versus starting with ultrasound.  The issue of what we would do if we found pathology as far as cystic changes was also discussed to include stopping the estradiol transiently to see if it does not help with pain.  We will start with ultrasound for pelvic surveillance and she will schedule this in follow-up for this.  Will check baseline urine analysis today.  The following was also discussed:  1. Breast health.  SBE monthly reviewed.  Recommended starting her first screening mammogram next year at age 2.  Her breast exam is normal today. 2. Pap smear done today.  Reports her last Pap smear was a number of years ago.  No history of abnormal Pap smears by her history.  Options to stop screening per current screening guidelines based on hysterectomy was discussed.  I did do a baseline surveillance Pap smear today. 3. Hormone replacement.  We discussed with a history of endometriosis whether estradiol could be contributing to its growth and symptoms.  She apparently  had tried stopping estradiol in the past but had life altering hot flushes and sweats and ultimately is on 2 mg daily.  The risks of estrogen to include increased risk of thrombosis such as stroke heart attack DVT in the breast cancer issue discussed.  At this point she will continue on her estradiol and follow-up at her ultrasound.  I  spent a total of 45 face-to-face minutes with the patient, over 50% was spent counseling and coordination of care.  Additional time was spent reviewing her extensive records from elsewhere.   Dara Lords MD, 11:45 AM 09/05/2018

## 2018-09-05 NOTE — Patient Instructions (Signed)
Follow up for ultrasound as scheduled 

## 2018-09-05 NOTE — Addendum Note (Signed)
Addended by: Dayna Barker on: 09/05/2018 12:08 PM   Modules accepted: Orders

## 2018-09-06 LAB — PAP IG W/ RFLX HPV ASCU

## 2018-09-07 LAB — URINALYSIS, COMPLETE W/RFL CULTURE
Bilirubin Urine: NEGATIVE
Glucose, UA: NEGATIVE
Hyaline Cast: NONE SEEN /LPF
Ketones, ur: NEGATIVE
Leukocyte Esterase: NEGATIVE
Nitrites, Initial: POSITIVE — AB
Protein, ur: NEGATIVE
Specific Gravity, Urine: 1.027 (ref 1.001–1.03)
pH: 6 (ref 5.0–8.0)

## 2018-09-07 LAB — URINE CULTURE
MICRO NUMBER:: 244921
SPECIMEN QUALITY:: ADEQUATE

## 2018-09-07 LAB — CULTURE INDICATED

## 2018-09-28 ENCOUNTER — Other Ambulatory Visit: Payer: Self-pay

## 2018-09-28 ENCOUNTER — Ambulatory Visit (INDEPENDENT_AMBULATORY_CARE_PROVIDER_SITE_OTHER): Payer: Medicare Other

## 2018-09-28 ENCOUNTER — Ambulatory Visit (INDEPENDENT_AMBULATORY_CARE_PROVIDER_SITE_OTHER): Payer: Medicare Other | Admitting: Gynecology

## 2018-09-28 ENCOUNTER — Encounter: Payer: Self-pay | Admitting: Gynecology

## 2018-09-28 ENCOUNTER — Other Ambulatory Visit: Payer: Self-pay | Admitting: Gynecology

## 2018-09-28 VITALS — BP 118/78

## 2018-09-28 DIAGNOSIS — R102 Pelvic and perineal pain: Secondary | ICD-10-CM

## 2018-09-28 NOTE — Patient Instructions (Signed)
Follow up if your pain persists 

## 2018-09-28 NOTE — Progress Notes (Signed)
    [name] 1980-12-07 [id number]        38 y.o.  G0P0 presents for ultrasound.  Complaining of intermittent left-sided pain over the last several weeks.  History of TAH RSO with negative pathology.  LSO following with negative pathology.  Most recently laparoscopy 2018 for left-sided pain with a persistent 3.7 cm cyst with findings reported as a "old blood filled cyst".  No pathology report could be retrieved.  Currently on Estrace 2 mg daily.  Past medical history,surgical history, problem list, medications, allergies, family history and social history were all reviewed and documented in the EPIC chart.  Directed ROS with pertinent positives and negatives documented in the history of present illness/assessment and plan.  Exam: Vitals:   09/28/18 1359  BP: 118/78   General appearance:  Normal  Ultrasound transvaginal and transabdominal status post hysterectomy BSO shows vaginal cuff normal.  No pelvic masses or free fluid noted.  No significant tenderness on vaginal probe exam.  Assessment/Plan:  38 y.o. G0P0 with history as above.  Negative ultrasound.  Patient is going to monitor her discomfort for now.  If it would persist we discussed possible referral to GI.  If it spontaneously resolves then will follow expectantly.    Dara Lords MD, 2:11 PM 09/28/2018

## 2018-10-01 ENCOUNTER — Encounter (HOSPITAL_COMMUNITY): Payer: Self-pay | Admitting: *Deleted

## 2018-10-01 ENCOUNTER — Ambulatory Visit (HOSPITAL_COMMUNITY)
Admission: EM | Admit: 2018-10-01 | Discharge: 2018-10-01 | Disposition: A | Discharge status: Home / Self Care | Payer: Medicare Other | Attending: Emergency Medicine | Admitting: Emergency Medicine

## 2018-10-01 ENCOUNTER — Other Ambulatory Visit: Payer: Self-pay

## 2018-10-01 DIAGNOSIS — J069 Acute upper respiratory infection, unspecified: Secondary | ICD-10-CM | POA: Diagnosis not present

## 2018-10-01 DIAGNOSIS — B9789 Other viral agents as the cause of diseases classified elsewhere: Secondary | ICD-10-CM

## 2018-10-01 MED ORDER — BENZONATATE 200 MG PO CAPS: 200.0000 mg | ORAL_CAPSULE | Freq: Three times a day (TID) | ORAL | 0 refills | Status: AC | PRN

## 2018-10-01 NOTE — Discharge Instructions (Addendum)
You likely having a viral upper respiratory infection. We recommended symptom control. I expect your symptoms to start improving in the next 1-2 weeks.  ° °1. Take a daily allergy pill/anti-histamine like Zyrtec, Claritin, or Store brand consistently for 2 weeks ° °2. For congestion you may try an oral decongestant like Mucinex or sudafed. You may also try intranasal flonase nasal spray or saline irrigations (neti pot, sinus cleanse) ° °3. For your sore throat you may try cepacol lozenges, salt water gargles, throat spray. Treatment of congestion may also help your sore throat. ° °4. For cough you may try Tessalon provided or over the counter Delsym, Robitussen, Mucinex DM ° °5. Take Tylenol or Ibuprofen to help with pain/inflammation ° °6. Stay hydrated, drink plenty of fluids to keep throat coated and less irritated ° °Honey Tea °For cough/sore throat try using a honey-based tea. Use 3 teaspoons of honey with juice squeezed from half lemon. Place shaved pieces of ginger into 1/2-1 cup of water and warm over stove top. Then mix the ingredients and repeat every 4 hours as needed. °
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
## 2018-10-01 NOTE — ED Triage Notes (Signed)
C/O cough with sudden onset last night without fever.

## 2018-11-28 ENCOUNTER — Other Ambulatory Visit: Payer: Self-pay

## 2018-11-30 ENCOUNTER — Other Ambulatory Visit: Payer: Self-pay

## 2018-11-30 ENCOUNTER — Encounter: Payer: Self-pay | Admitting: Gynecology

## 2018-11-30 ENCOUNTER — Ambulatory Visit (INDEPENDENT_AMBULATORY_CARE_PROVIDER_SITE_OTHER): Payer: Medicare Other | Admitting: Gynecology

## 2018-11-30 VITALS — BP 118/78

## 2018-11-30 DIAGNOSIS — Z79890 Hormone replacement therapy (postmenopausal): Secondary | ICD-10-CM

## 2018-11-30 DIAGNOSIS — N951 Menopausal and female climacteric states: Secondary | ICD-10-CM

## 2018-11-30 NOTE — Progress Notes (Signed)
    Ramsey L Ngo 04-27-1981 [id number]        38 y.o.  G0P0 presents complaining of most recently having worsening hot flashes and night sweats.  She is now waking up at night wet.  Status post hysterectomy BSO in the past for endometriosis.  On estradiol 2 mg daily.  Had an issue with adjusting her replacement early on but has been doing well with the 2 mg until lately.  No weight changes skin or hair changes.  Past medical history,surgical history, problem list, medications, allergies, family history and social history were all reviewed and documented in the EPIC chart.  Directed ROS with pertinent positives and negatives documented in the history of present illness/assessment and plan.  Exam: Vitals:   11/30/18 1358  BP: 118/78   General appearance:  Normal HEENT normal without evidence of thyromegaly.  Assessment/Plan:  38 y.o. G0P0 with worsening menopausal symptoms most recently.  On 2 mg of estradiol.  No other symptoms such as weight changes hair or skin.  Will check baseline TSH and estradiol level to make sure she is absorbing her estrogen pills.  Recommended for now increased to estradiol 2 mg twice daily.  We discussed the risks of HRT to include increased risk of stroke heart attack DVT as well as the breast cancer issue.  She will follow-up with results of increasing her estrogen dose by phone and follow-up on her lab results.   Dara Lords MD, 2:18 PM 11/30/2018

## 2018-11-30 NOTE — Patient Instructions (Signed)
Increase your estrogen pills to twice daily.  Office will follow-up with you with your blood test results.

## 2018-12-01 LAB — ESTRADIOL: Estradiol: 25 pg/mL

## 2018-12-01 LAB — TSH: TSH: 5.06 mIU/L — ABNORMAL HIGH

## 2019-01-14 ENCOUNTER — Other Ambulatory Visit: Payer: Self-pay | Admitting: Obstetrics

## 2019-01-14 DIAGNOSIS — E8941 Symptomatic postprocedural ovarian failure: Secondary | ICD-10-CM

## 2019-01-26 ENCOUNTER — Encounter: Payer: Self-pay | Admitting: Gynecology

## 2019-01-26 ENCOUNTER — Other Ambulatory Visit: Payer: Self-pay | Admitting: Gynecology

## 2019-01-26 DIAGNOSIS — R7989 Other specified abnormal findings of blood chemistry: Secondary | ICD-10-CM

## 2019-01-31 ENCOUNTER — Telehealth: Payer: Self-pay

## 2019-01-31 ENCOUNTER — Other Ambulatory Visit: Payer: Self-pay | Admitting: Gynecology

## 2019-01-31 DIAGNOSIS — R7989 Other specified abnormal findings of blood chemistry: Secondary | ICD-10-CM

## 2019-01-31 NOTE — Telephone Encounter (Signed)
I would recommend starting Vivelle 0.1 mg patch equivalent twice weekly.  Stop the oral estrogen and we will see how she does.

## 2019-01-31 NOTE — Telephone Encounter (Signed)
-----   Message from Anastasio Auerbach, MD sent at 12/05/2018 10:55 AM EDT ----- Tell patient that her estradiol level is at the lower end of normal range.  It tells me that she is not absorbing the estrogen well.  We have increased her to 2 mg twice daily.  We will see how she does with that.  If she continues to have menopausal symptoms and I am going to switch her over to the patch.  Her TSH was slightly elevated which could indicate hypothyroidism.  We can see these values jump around so I would recommend repeating a full thyroid panel with TSH in 1 to 2 months to relook at these values.  If her TSH remains elevated then would consider placing her on thyroid replacement/have her follow-up with endocrinology.

## 2019-01-31 NOTE — Telephone Encounter (Signed)
Patient said hot flashes are still terrible. She has depression and feels like it is worse maybe because of this.

## 2019-02-01 ENCOUNTER — Other Ambulatory Visit: Payer: Medicare Other

## 2019-02-01 ENCOUNTER — Other Ambulatory Visit: Payer: Self-pay

## 2019-02-01 DIAGNOSIS — R7989 Other specified abnormal findings of blood chemistry: Secondary | ICD-10-CM

## 2019-02-01 MED ORDER — ESTRADIOL 0.1 MG/24HR TD PTTW: 1.0000 | MEDICATED_PATCH | TRANSDERMAL | 3 refills | Status: DC

## 2019-02-01 NOTE — Telephone Encounter (Signed)
Patient came if for labwork to be drawn and I spoke with her and let her know that Dr. Loetta Rough prescribed patch. Advised her to d/c oral estrogen.  We reviewed directions for use and how/where to apply. Rx sent.

## 2019-02-02 ENCOUNTER — Other Ambulatory Visit: Payer: Self-pay | Admitting: Gynecology

## 2019-02-02 DIAGNOSIS — R7989 Other specified abnormal findings of blood chemistry: Secondary | ICD-10-CM

## 2019-02-02 LAB — THYROID PANEL WITH TSH
Free Thyroxine Index: 1.3 — ABNORMAL LOW (ref 1.4–3.8)
T3 Uptake: 23 % (ref 22–35)
T4, Total: 5.7 ug/dL (ref 5.1–11.9)
TSH: 3.31 mIU/L

## 2019-04-03 ENCOUNTER — Encounter: Payer: Self-pay | Admitting: Gynecology

## 2019-04-04 ENCOUNTER — Encounter (HOSPITAL_COMMUNITY): Payer: Self-pay | Admitting: Family Medicine

## 2019-04-04 ENCOUNTER — Ambulatory Visit (HOSPITAL_COMMUNITY)
Admission: EM | Admit: 2019-04-04 | Discharge: 2019-04-04 | Disposition: A | Discharge status: Home / Self Care | Payer: Medicare Other | Attending: Family Medicine | Admitting: Family Medicine

## 2019-04-04 ENCOUNTER — Other Ambulatory Visit: Payer: Self-pay

## 2019-04-04 DIAGNOSIS — I10 Essential (primary) hypertension: Secondary | ICD-10-CM | POA: Diagnosis not present

## 2019-04-04 DIAGNOSIS — R202 Paresthesia of skin: Secondary | ICD-10-CM | POA: Insufficient documentation

## 2019-04-04 LAB — COMPREHENSIVE METABOLIC PANEL
ALT: 19 U/L (ref 0–44)
AST: 23 U/L (ref 15–41)
Albumin: 4.5 g/dL (ref 3.5–5.0)
Alkaline Phosphatase: 68 U/L (ref 38–126)
Anion gap: 11 (ref 5–15)
BUN: 12 mg/dL (ref 6–20)
CO2: 25 mmol/L (ref 22–32)
Calcium: 9.4 mg/dL (ref 8.9–10.3)
Chloride: 102 mmol/L (ref 98–111)
Creatinine, Ser: 0.93 mg/dL (ref 0.44–1.00)
GFR calc Af Amer: >60 mL/min (ref >60)
GFR calc non Af Amer: >60 mL/min (ref >60)
Glucose, Bld: 88 mg/dL (ref 70–99)
Potassium: 4 mmol/L (ref 3.5–5.1)
Sodium: 138 mmol/L (ref 135–145)
Total Bilirubin: 0.5 mg/dL (ref 0.3–1.2)
Total Protein: 7.1 g/dL (ref 6.5–8.1)

## 2019-04-04 LAB — CBC WITH DIFFERENTIAL/PLATELET
Abs Immature Granulocytes: 0.02 10*3/uL (ref 0.00–0.07)
Basophils Absolute: 0 10*3/uL (ref 0.0–0.1)
Basophils Relative: 1 %
Eosinophils Absolute: 0.2 10*3/uL (ref 0.0–0.5)
Eosinophils Relative: 4 %
HCT: 41.5 % (ref 36.0–46.0)
Hemoglobin: 13.8 g/dL (ref 12.0–15.0)
Immature Granulocytes: 0 %
Lymphocytes Relative: 27 %
Lymphs Abs: 1.8 10*3/uL (ref 0.7–4.0)
MCH: 30.3 pg (ref 26.0–34.0)
MCHC: 33.3 g/dL (ref 30.0–36.0)
MCV: 91.2 fL (ref 80.0–100.0)
Monocytes Absolute: 0.4 10*3/uL (ref 0.1–1.0)
Monocytes Relative: 6 %
Neutro Abs: 4.2 10*3/uL (ref 1.7–7.7)
Neutrophils Relative %: 62 %
Platelets: 261 10*3/uL (ref 150–400)
RBC: 4.55 MIL/uL (ref 3.87–5.11)
RDW: 11.8 % (ref 11.5–15.5)
WBC: 6.7 10*3/uL (ref 4.0–10.5)
nRBC: 0 % (ref 0.0–0.2)

## 2019-04-04 LAB — T4, FREE: Free T4: 0.55 ng/dL — ABNORMAL LOW (ref 0.61–1.12)

## 2019-04-04 LAB — TSH: TSH: 2.383 u[IU]/mL (ref 0.350–4.500)

## 2019-04-04 LAB — SEDIMENTATION RATE: Sed Rate: 4 mm/hr (ref 0–22)

## 2019-04-04 NOTE — Discharge Instructions (Addendum)
The labs should be back tomorrow.

## 2019-04-04 NOTE — ED Triage Notes (Addendum)
Pt cc she has chest "pricks"  Per the Pt . X 3 days.

## 2019-04-04 NOTE — ED Notes (Signed)
Patient able to ambulate independently  
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
## 2019-04-06 ENCOUNTER — Telehealth (HOSPITAL_COMMUNITY): Payer: Self-pay | Admitting: Emergency Medicine

## 2019-04-06 NOTE — Telephone Encounter (Signed)
Attempted to reach patient about labs. No answer at this time

## 2019-04-25 ENCOUNTER — Other Ambulatory Visit: Payer: Self-pay

## 2019-04-25 ENCOUNTER — Inpatient Hospital Stay (HOSPITAL_COMMUNITY)
Admission: EM | Admit: 2019-04-25 | Discharge: 2019-04-28 | DRG: 390 | Disposition: A | Discharge status: Home / Self Care | Payer: Medicare Other | Attending: Internal Medicine | Admitting: Internal Medicine

## 2019-04-25 ENCOUNTER — Emergency Department (HOSPITAL_COMMUNITY): Payer: Medicare Other

## 2019-04-25 DIAGNOSIS — F3189 Other bipolar disorder: Secondary | ICD-10-CM | POA: Diagnosis present

## 2019-04-25 DIAGNOSIS — Z888 Allergy status to other drugs, medicaments and biological substances status: Secondary | ICD-10-CM

## 2019-04-25 DIAGNOSIS — Z881 Allergy status to other antibiotic agents status: Secondary | ICD-10-CM

## 2019-04-25 DIAGNOSIS — Z978 Presence of other specified devices: Secondary | ICD-10-CM

## 2019-04-25 DIAGNOSIS — Z0189 Encounter for other specified special examinations: Secondary | ICD-10-CM

## 2019-04-25 DIAGNOSIS — Z87891 Personal history of nicotine dependence: Secondary | ICD-10-CM

## 2019-04-25 DIAGNOSIS — Z87440 Personal history of urinary (tract) infections: Secondary | ICD-10-CM

## 2019-04-25 DIAGNOSIS — Z833 Family history of diabetes mellitus: Secondary | ICD-10-CM

## 2019-04-25 DIAGNOSIS — Z91018 Allergy to other foods: Secondary | ICD-10-CM

## 2019-04-25 DIAGNOSIS — Z818 Family history of other mental and behavioral disorders: Secondary | ICD-10-CM

## 2019-04-25 DIAGNOSIS — G43909 Migraine, unspecified, not intractable, without status migrainosus: Secondary | ICD-10-CM | POA: Diagnosis present

## 2019-04-25 DIAGNOSIS — K565 Intestinal adhesions [bands], unspecified as to partial versus complete obstruction: Secondary | ICD-10-CM | POA: Diagnosis not present

## 2019-04-25 DIAGNOSIS — G8929 Other chronic pain: Secondary | ICD-10-CM | POA: Diagnosis present

## 2019-04-25 DIAGNOSIS — G2581 Restless legs syndrome: Secondary | ICD-10-CM | POA: Diagnosis present

## 2019-04-25 DIAGNOSIS — I10 Essential (primary) hypertension: Secondary | ICD-10-CM | POA: Diagnosis present

## 2019-04-25 DIAGNOSIS — K219 Gastro-esophageal reflux disease without esophagitis: Secondary | ICD-10-CM | POA: Diagnosis present

## 2019-04-25 DIAGNOSIS — K56609 Unspecified intestinal obstruction, unspecified as to partial versus complete obstruction: Secondary | ICD-10-CM | POA: Diagnosis present

## 2019-04-25 DIAGNOSIS — Z885 Allergy status to narcotic agent status: Secondary | ICD-10-CM

## 2019-04-25 DIAGNOSIS — Z800 Family history of malignant neoplasm of digestive organs: Secondary | ICD-10-CM

## 2019-04-25 DIAGNOSIS — Z823 Family history of stroke: Secondary | ICD-10-CM

## 2019-04-25 DIAGNOSIS — Z915 Personal history of self-harm: Secondary | ICD-10-CM

## 2019-04-25 DIAGNOSIS — K589 Irritable bowel syndrome without diarrhea: Secondary | ICD-10-CM | POA: Diagnosis present

## 2019-04-25 DIAGNOSIS — Z20828 Contact with and (suspected) exposure to other viral communicable diseases: Secondary | ICD-10-CM | POA: Diagnosis present

## 2019-04-25 DIAGNOSIS — Z8249 Family history of ischemic heart disease and other diseases of the circulatory system: Secondary | ICD-10-CM

## 2019-04-25 DIAGNOSIS — Z975 Presence of (intrauterine) contraceptive device: Secondary | ICD-10-CM

## 2019-04-25 DIAGNOSIS — F4310 Post-traumatic stress disorder, unspecified: Secondary | ICD-10-CM | POA: Diagnosis present

## 2019-04-25 DIAGNOSIS — N3010 Interstitial cystitis (chronic) without hematuria: Secondary | ICD-10-CM | POA: Diagnosis present

## 2019-04-25 DIAGNOSIS — M549 Dorsalgia, unspecified: Secondary | ICD-10-CM | POA: Diagnosis present

## 2019-04-25 DIAGNOSIS — Z803 Family history of malignant neoplasm of breast: Secondary | ICD-10-CM

## 2019-04-25 DIAGNOSIS — N390 Urinary tract infection, site not specified: Secondary | ICD-10-CM | POA: Diagnosis present

## 2019-04-25 DIAGNOSIS — F319 Bipolar disorder, unspecified: Secondary | ICD-10-CM | POA: Diagnosis present

## 2019-04-25 LAB — URINALYSIS, ROUTINE W REFLEX MICROSCOPIC
Bilirubin Urine: NEGATIVE
Glucose, UA: NEGATIVE mg/dL
Hgb urine dipstick: NEGATIVE
Ketones, ur: NEGATIVE mg/dL
Leukocytes,Ua: NEGATIVE
Nitrite: POSITIVE — AB
Protein, ur: 30 mg/dL — AB
Specific Gravity, Urine: 1.027 (ref 1.005–1.030)
pH: 5 (ref 5.0–8.0)

## 2019-04-25 LAB — CBC
HCT: 44.8 % (ref 36.0–46.0)
Hemoglobin: 14.2 g/dL (ref 12.0–15.0)
MCH: 29.9 pg (ref 26.0–34.0)
MCHC: 31.7 g/dL (ref 30.0–36.0)
MCV: 94.3 fL (ref 80.0–100.0)
Platelets: 311 10*3/uL (ref 150–400)
RBC: 4.75 MIL/uL (ref 3.87–5.11)
RDW: 11.9 % (ref 11.5–15.5)
WBC: 7.6 10*3/uL (ref 4.0–10.5)
nRBC: 0 % (ref 0.0–0.2)

## 2019-04-25 LAB — COMPREHENSIVE METABOLIC PANEL
ALT: 19 U/L (ref 0–44)
AST: 22 U/L (ref 15–41)
Albumin: 4.8 g/dL (ref 3.5–5.0)
Alkaline Phosphatase: 68 U/L (ref 38–126)
Anion gap: 12 (ref 5–15)
BUN: 21 mg/dL — ABNORMAL HIGH (ref 6–20)
CO2: 25 mmol/L (ref 22–32)
Calcium: 9.5 mg/dL (ref 8.9–10.3)
Chloride: 102 mmol/L (ref 98–111)
Creatinine, Ser: 1.01 mg/dL — ABNORMAL HIGH (ref 0.44–1.00)
GFR calc Af Amer: >60 mL/min (ref >60)
GFR calc non Af Amer: >60 mL/min (ref >60)
Glucose, Bld: 109 mg/dL — ABNORMAL HIGH (ref 70–99)
Potassium: 4 mmol/L (ref 3.5–5.1)
Sodium: 139 mmol/L (ref 135–145)
Total Bilirubin: 0.4 mg/dL (ref 0.3–1.2)
Total Protein: 7.9 g/dL (ref 6.5–8.1)

## 2019-04-25 LAB — LIPASE, BLOOD: Lipase: 37 U/L (ref 11–51)

## 2019-04-25 MED ORDER — SODIUM CHLORIDE 0.9 % IV BOLUS
1000.0000 mL | Freq: Once | INTRAVENOUS | Status: AC
  Administered 2019-04-25: 1000 mL via INTRAVENOUS

## 2019-04-25 MED ORDER — KETOROLAC TROMETHAMINE 30 MG/ML IJ SOLN
30.0000 mg | Freq: Once | INTRAMUSCULAR | Status: AC
  Administered 2019-04-25: 30 mg via INTRAVENOUS
  Filled 2019-04-25: qty 1

## 2019-04-25 MED ORDER — ONDANSETRON HCL 4 MG/2ML IJ SOLN
4.0000 mg | Freq: Once | INTRAMUSCULAR | Status: AC
  Administered 2019-04-25: 4 mg via INTRAVENOUS
  Filled 2019-04-25: qty 2

## 2019-04-25 MED ORDER — SODIUM CHLORIDE 0.9% FLUSH
3.0000 mL | Freq: Once | INTRAVENOUS | Status: AC
  Administered 2019-04-26: 3 mL via INTRAVENOUS

## 2019-04-25 NOTE — ED Triage Notes (Signed)
Patient reports she was diagnosed with a UTI and she is not getting better. Patient reports she has abdominal pain, flank pain, and her "Kidney's feel like bricks" Patient has been on antibiotics with no relief
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
## 2019-04-25 NOTE — ED Notes (Signed)
Pt reports recurrent history of UTIs. She has been on two different types of antibiotics for greater than one week with no resolution of symptoms. Pt states she has dysuria everytime she uses the bathroom and the pain has now progressed to her lower back. Pt has positive CVA tenderness bilaterally. Pt denies vomiting but does have nausea. Pt denies fever,hematuria.

## 2019-04-26 ENCOUNTER — Encounter (HOSPITAL_COMMUNITY): Payer: Self-pay

## 2019-04-26 ENCOUNTER — Inpatient Hospital Stay (HOSPITAL_COMMUNITY): Payer: Medicare Other

## 2019-04-26 ENCOUNTER — Emergency Department (HOSPITAL_COMMUNITY): Payer: Medicare Other

## 2019-04-26 DIAGNOSIS — Z888 Allergy status to other drugs, medicaments and biological substances status: Secondary | ICD-10-CM | POA: Diagnosis not present

## 2019-04-26 DIAGNOSIS — I10 Essential (primary) hypertension: Secondary | ICD-10-CM | POA: Diagnosis present

## 2019-04-26 DIAGNOSIS — K56609 Unspecified intestinal obstruction, unspecified as to partial versus complete obstruction: Secondary | ICD-10-CM | POA: Diagnosis present

## 2019-04-26 DIAGNOSIS — K565 Intestinal adhesions [bands], unspecified as to partial versus complete obstruction: Secondary | ICD-10-CM | POA: Diagnosis present

## 2019-04-26 DIAGNOSIS — Z91018 Allergy to other foods: Secondary | ICD-10-CM | POA: Diagnosis not present

## 2019-04-26 DIAGNOSIS — Z833 Family history of diabetes mellitus: Secondary | ICD-10-CM | POA: Diagnosis not present

## 2019-04-26 DIAGNOSIS — Z881 Allergy status to other antibiotic agents status: Secondary | ICD-10-CM | POA: Diagnosis not present

## 2019-04-26 DIAGNOSIS — F319 Bipolar disorder, unspecified: Secondary | ICD-10-CM | POA: Diagnosis present

## 2019-04-26 DIAGNOSIS — N3010 Interstitial cystitis (chronic) without hematuria: Secondary | ICD-10-CM | POA: Diagnosis not present

## 2019-04-26 DIAGNOSIS — Z87440 Personal history of urinary (tract) infections: Secondary | ICD-10-CM | POA: Diagnosis not present

## 2019-04-26 DIAGNOSIS — Z800 Family history of malignant neoplasm of digestive organs: Secondary | ICD-10-CM | POA: Diagnosis not present

## 2019-04-26 DIAGNOSIS — G2581 Restless legs syndrome: Secondary | ICD-10-CM | POA: Diagnosis present

## 2019-04-26 DIAGNOSIS — F4310 Post-traumatic stress disorder, unspecified: Secondary | ICD-10-CM | POA: Diagnosis present

## 2019-04-26 DIAGNOSIS — Z803 Family history of malignant neoplasm of breast: Secondary | ICD-10-CM | POA: Diagnosis not present

## 2019-04-26 DIAGNOSIS — K589 Irritable bowel syndrome without diarrhea: Secondary | ICD-10-CM | POA: Diagnosis present

## 2019-04-26 DIAGNOSIS — Z20828 Contact with and (suspected) exposure to other viral communicable diseases: Secondary | ICD-10-CM | POA: Diagnosis present

## 2019-04-26 DIAGNOSIS — Z978 Presence of other specified devices: Secondary | ICD-10-CM | POA: Diagnosis not present

## 2019-04-26 DIAGNOSIS — K219 Gastro-esophageal reflux disease without esophagitis: Secondary | ICD-10-CM | POA: Diagnosis present

## 2019-04-26 DIAGNOSIS — Z823 Family history of stroke: Secondary | ICD-10-CM | POA: Diagnosis not present

## 2019-04-26 DIAGNOSIS — Z87891 Personal history of nicotine dependence: Secondary | ICD-10-CM | POA: Diagnosis not present

## 2019-04-26 DIAGNOSIS — G43909 Migraine, unspecified, not intractable, without status migrainosus: Secondary | ICD-10-CM | POA: Diagnosis present

## 2019-04-26 DIAGNOSIS — G8929 Other chronic pain: Secondary | ICD-10-CM | POA: Diagnosis present

## 2019-04-26 DIAGNOSIS — Z885 Allergy status to narcotic agent status: Secondary | ICD-10-CM | POA: Diagnosis not present

## 2019-04-26 DIAGNOSIS — Z8249 Family history of ischemic heart disease and other diseases of the circulatory system: Secondary | ICD-10-CM | POA: Diagnosis not present

## 2019-04-26 DIAGNOSIS — F3189 Other bipolar disorder: Secondary | ICD-10-CM

## 2019-04-26 DIAGNOSIS — M549 Dorsalgia, unspecified: Secondary | ICD-10-CM | POA: Diagnosis present

## 2019-04-26 DIAGNOSIS — Z915 Personal history of self-harm: Secondary | ICD-10-CM | POA: Diagnosis not present

## 2019-04-26 DIAGNOSIS — Z818 Family history of other mental and behavioral disorders: Secondary | ICD-10-CM | POA: Diagnosis not present

## 2019-04-26 LAB — URINE CULTURE: Culture: <10000 — AB

## 2019-04-26 LAB — SARS CORONAVIRUS 2 BY RT PCR (HOSPITAL ORDER, PERFORMED IN ~~LOC~~ HOSPITAL LAB): SARS Coronavirus 2: NEGATIVE

## 2019-04-26 LAB — HIV ANTIBODY (ROUTINE TESTING W REFLEX): HIV Screen 4th Generation wRfx: NONREACTIVE

## 2019-04-26 MED ORDER — LORAZEPAM 2 MG/ML IJ SOLN
1.0000 mg | Freq: Once | INTRAMUSCULAR | Status: AC
  Administered 2019-04-26: 1 mg via INTRAVENOUS
  Filled 2019-04-26: qty 1

## 2019-04-26 MED ORDER — SODIUM CHLORIDE (PF) 0.9 % IJ SOLN
INTRAMUSCULAR | Status: AC
  Administered 2019-04-26: 07:00:00
  Filled 2019-04-26: qty 50

## 2019-04-26 MED ORDER — ENOXAPARIN SODIUM 40 MG/0.4ML ~~LOC~~ SOLN
40.0000 mg | SUBCUTANEOUS | Status: DC
  Administered 2019-04-26 – 2019-04-27 (×2): 40 mg via SUBCUTANEOUS
  Filled 2019-04-26 (×2): qty 0.4

## 2019-04-26 MED ORDER — SODIUM CHLORIDE 0.9 % IV SOLN
1.0000 g | INTRAVENOUS | Status: DC
  Administered 2019-04-26 – 2019-04-27 (×2): 1 g via INTRAVENOUS
  Filled 2019-04-26: qty 10
  Filled 2019-04-26: qty 1

## 2019-04-26 MED ORDER — ONDANSETRON HCL 4 MG/2ML IJ SOLN
4.0000 mg | Freq: Four times a day (QID) | INTRAMUSCULAR | Status: DC | PRN
  Administered 2019-04-26 (×2): 4 mg via INTRAVENOUS
  Filled 2019-04-26 (×2): qty 2

## 2019-04-26 MED ORDER — HYDROXYZINE HCL 25 MG PO TABS
12.5000 mg | ORAL_TABLET | Freq: Every day | ORAL | Status: DC
  Administered 2019-04-27 – 2019-04-28 (×2): 12.5 mg via ORAL
  Filled 2019-04-26 (×2): qty 1

## 2019-04-26 MED ORDER — BISACODYL 10 MG RE SUPP: 10.0000 mg | Freq: Every day | RECTAL | Status: DC | PRN

## 2019-04-26 MED ORDER — MORPHINE SULFATE (PF) 4 MG/ML IV SOLN
4.0000 mg | Freq: Once | INTRAVENOUS | Status: AC
  Administered 2019-04-26: 4 mg via INTRAVENOUS
  Filled 2019-04-26: qty 1

## 2019-04-26 MED ORDER — PROCHLORPERAZINE EDISYLATE 10 MG/2ML IJ SOLN
10.0000 mg | Freq: Four times a day (QID) | INTRAMUSCULAR | Status: DC | PRN
  Administered 2019-04-26: 10 mg via INTRAVENOUS
  Filled 2019-04-26: qty 2

## 2019-04-26 MED ORDER — ONDANSETRON HCL 4 MG/2ML IJ SOLN
4.0000 mg | Freq: Once | INTRAMUSCULAR | Status: AC
  Administered 2019-04-26: 4 mg via INTRAVENOUS
  Filled 2019-04-26: qty 2

## 2019-04-26 MED ORDER — ALBUTEROL SULFATE (2.5 MG/3ML) 0.083% IN NEBU: 2.5000 mg | INHALATION_SOLUTION | RESPIRATORY_TRACT | Status: DC | PRN

## 2019-04-26 MED ORDER — ALBUTEROL SULFATE (2.5 MG/3ML) 0.083% IN NEBU: 3.0000 mL | INHALATION_SOLUTION | RESPIRATORY_TRACT | Status: DC | PRN

## 2019-04-26 MED ORDER — DIPHENHYDRAMINE HCL 50 MG/ML IJ SOLN
12.5000 mg | Freq: Once | INTRAMUSCULAR | Status: AC
  Administered 2019-04-26: 12.5 mg via INTRAVENOUS
  Filled 2019-04-26: qty 1

## 2019-04-26 MED ORDER — BUSPIRONE HCL 5 MG PO TABS
7.5000 mg | ORAL_TABLET | Freq: Every day | ORAL | Status: DC
  Administered 2019-04-27: 7.5 mg via ORAL
  Filled 2019-04-26: qty 2

## 2019-04-26 MED ORDER — IOHEXOL 300 MG/ML  SOLN
100.0000 mL | Freq: Once | INTRAMUSCULAR | Status: AC | PRN
  Administered 2019-04-26: 100 mL via INTRAVENOUS

## 2019-04-26 MED ORDER — DIATRIZOATE MEGLUMINE & SODIUM 66-10 % PO SOLN
90.0000 mL | Freq: Once | ORAL | Status: AC
  Administered 2019-04-26: 90 mL via NASOGASTRIC
  Filled 2019-04-26: qty 90

## 2019-04-26 MED ORDER — CARBAMAZEPINE 200 MG PO TABS
100.0000 mg | ORAL_TABLET | Freq: Two times a day (BID) | ORAL | Status: DC
  Administered 2019-04-27 – 2019-04-28 (×3): 100 mg via ORAL
  Filled 2019-04-26 (×4): qty 0.5

## 2019-04-26 MED ORDER — IOHEXOL 300 MG/ML  SOLN
30.0000 mL | Freq: Once | INTRAMUSCULAR | Status: AC | PRN
  Administered 2019-04-26: 30 mL via ORAL

## 2019-04-26 MED ORDER — HYDROXYZINE HCL 25 MG PO TABS
25.0000 mg | ORAL_TABLET | Freq: Every day | ORAL | Status: DC
  Administered 2019-04-27: 25 mg via ORAL
  Filled 2019-04-26: qty 1

## 2019-04-26 MED ORDER — SODIUM CHLORIDE 0.9 % IV SOLN
INTRAVENOUS | Status: DC
  Administered 2019-04-26 – 2019-04-28 (×5): via INTRAVENOUS

## 2019-04-26 MED ORDER — MORPHINE SULFATE (PF) 2 MG/ML IV SOLN
2.0000 mg | INTRAVENOUS | Status: DC | PRN
  Administered 2019-04-26 (×2): 2 mg via INTRAVENOUS
  Filled 2019-04-26 (×2): qty 1

## 2019-04-26 MED ORDER — LURASIDONE HCL 20 MG PO TABS
20.0000 mg | ORAL_TABLET | Freq: Every day | ORAL | Status: DC
  Administered 2019-04-27 – 2019-04-28 (×2): 20 mg via ORAL
  Filled 2019-04-26 (×2): qty 1

## 2019-04-26 MED ORDER — ONDANSETRON HCL 4 MG PO TABS: 4.0000 mg | ORAL_TABLET | Freq: Four times a day (QID) | ORAL | Status: DC | PRN

## 2019-04-26 NOTE — ED Notes (Signed)
This RN attempted x2 to place smallest gauge NGT without success. Patient calm and cooperative, but nasal passage too small. Bleeding noted from site. Patient instructed to hold pressure to nose to stop bleeding. RN Ali Lowe to bedside to attempt placement.

## 2019-04-26 NOTE — ED Notes (Signed)
RN pulled out 7cm of NG tube and re-inserted 5cm of tubing.

## 2019-04-26 NOTE — ED Notes (Signed)
RN called ICU. ICU is sending Nose Tube Holder Sticker to ED due to ED not having any at this time.

## 2019-04-26 NOTE — ED Notes (Signed)
2nd Verbal Order for KUB has been put in for verification of placement to ensure NG tube is not curled up.

## 2019-04-26 NOTE — ED Notes (Signed)
GI MD at bedside with patient.

## 2019-04-26 NOTE — Progress Notes (Signed)
Called in to patient room, patient reported pulling the NG tube out because she cannot take it anymore. Educated the patient about the importance of NG tube ,verbalized understanding and very apologetic, but does not want NG tube back in. Will notify on call provider. We will continue to monitor.

## 2019-04-26 NOTE — ED Notes (Signed)
ED Provider made aware.

## 2019-04-26 NOTE — ED Notes (Signed)
RN called ICU Charge RN to see if RN could attempt NG tube. Sarah RN is in route to attempt NG tube placement.

## 2019-04-26 NOTE — ED Notes (Signed)
RN has asked Nurse Secretary to page Hospitalist or On-Call GI MD for patient regarding NG Tube placement.

## 2019-04-26 NOTE — H&P (Signed)
History and Physical  [name] WUJ:[id number] DOB: 1980-08-31 DOA: 04/25/2019   Patient coming from: Home & is able to ambulate  Chief Complaint: Abdominal pain  HPI: [name] is a 38 y.o. female with medical history significant for recurrent UTI, interstitial cystitis, anxiety/bipolar, GERD, IBD, presents to the ED complaining of abdominal pain located around her bilateral flank region as well as suprapubic area.  Reports pain as intermittent, sharp, denies any radiation or aggravating alleviating factors.  Patient has a history of recurrent UTI and follows up with her urologist, recently prescribed nitrofurantoin as well as trimethoprim for ?UTI.  Despite taking the antibiotics, as well as over-the-counter pain meds, patient continues to have abdominal pain, reported some subjective fever/chills as well as dysuria.  Patient denies any chest pain, shortness of breath, cough, nausea/vomiting, diarrhea.  Patient reported last bowel movement was about 2 days ago, unsure if she is passing gas.  ED Course: Vital signs stable other than tachycardia with heart rate in the 120s.  Labs unremarkable, UA showed positive nitrites, few bacteria, hematuria, mucus.  UC pending.  CT renal stone negative for hydronephrosis/ureteral stone, bladder unremarkable.  CT abdomen positive for SBO, otherwise unremarkable.  Multiple attempts to place NG tube.  General surgery was consulted.  [facility] hospitalist asked to admit patient  Review of Systems: Review of systems are otherwise negative   Past Medical History:  Diagnosis Date   Anxiety    Bipolar 1 disorder (HCC)    Bipolar 1 disorder (HCC)    Chronic back pain    Endometriosis    Galactorrhea    GERD (gastroesophageal reflux disease)    Hypertension    IBS (irritable bowel syndrome)    IC (interstitial cystitis)    Insomnia    Interstitial cystitis    Mental disorder    Migraine    PTSD (post-traumatic stress disorder)     Seasonal allergies    Sinusitis    Past Surgical History:  Procedure Laterality Date   ABDOMINAL HYSTERECTOMY  10/16/2013   ABDOMINAL SURGERY     BLADDER SURGERY     distenstion numerous times   BOWEL RESECTION  1998   prolapsed bowel   BUNIONECTOMY     FOOT SURGERY     bunionectomy bil. pins put in and taken out   INTERSTIM IMPLANT PLACEMENT     INTRAUTERINE DEVICE INSERTION  2 yrs ago   Mirena   LAPAROSCOPY Right 06/13/2013   Procedure: LAPAROSCOPY OPERATIVE WITH OVARIAN CYSTECTOMY convert to open 1329;  Surgeon: Purcell Nails, MD;  Location: [facility];  Service: Gynecology;  Laterality: Right;   LAPAROTOMY Right 06/13/2013   Procedure: EXPLORATORY LAPAROTOMY, ovarian cyst;  Surgeon: Purcell Nails, MD;  Location: [facility];  Service: Gynecology;  Laterality: Right;   OOPHORECTOMY     BSO   TUBAL LIGATION      Social History:  reports that she quit smoking about 17 years ago. Her smoking use included cigars. She quit after 2.50 years of use. She has never used smokeless tobacco. She reports current alcohol use. She reports that she does not use drugs.   Allergies  Allergen Reactions   Gabapentin Other (See Comments)    Reaction:  Suicidal thoughts    Hydrocodone Shortness Of Breath   Methadone Other (See Comments)    Confusion/fainting   Augmentin [Amoxicillin-Pot Clavulanate] Nausea And Vomiting and Other (See Comments)    Has patient had a PCN reaction causing immediate rash, facial/tongue/throat swelling, SOB  or lightheadedness with hypotension: No Has patient had a PCN reaction causing severe rash involving mucus membranes or skin necrosis: No Has patient had a PCN reaction that required hospitalization No Has patient had a PCN reaction occurring within the last 10 years: No If all of the above answers are "NO", then may proceed with Cephalosporin use.   Azithromycin Other (See Comments)    Pt states that this medication does not work for her.      Buprenorphine Itching   Ciprofloxacin Nausea Only   Erythromycin Nausea And Vomiting   Garlic Other (See Comments)    Reaction:  Bladder issues    Hydrocodone-Acetaminophen Other (See Comments)    Pt states that this medication made her pass out.     Methenamine Other (See Comments)    Reaction:  Urinary retention    Morphine And Related Itching   Onion Nausea And Vomiting   Prozac [Fluoxetine Hcl] Other (See Comments)    Reaction:  Suicidal thoughts    Pyridium [Phenazopyridine Hcl] Other (See Comments)    Reaction:  Bladder pain    Uribel [Meth-Hyo-M Bl-Na Phos-Ph Sal] Other (See Comments)    Reaction:  Bladder issues     Family History  Problem Relation Age of Onset   Hypertension Mother    Esophageal cancer Maternal Grandmother    Hypertension Maternal Grandmother    Heart disease Maternal Grandmother    Stomach cancer Maternal Grandmother    Breast cancer Maternal Grandmother [id number]   Diabetes Maternal Grandfather    Stroke Maternal Grandfather    Depression Maternal Grandfather    Anxiety disorder Maternal Grandfather    Hypertension Maternal Grandfather    Heart disease Maternal Grandfather       Prior to Admission medications   Medication Sig Start Date End Date Taking? Authorizing Provider  albuterol (PROVENTIL HFA;VENTOLIN HFA) 108 (90 Base) MCG/ACT inhaler Inhale 1 puff into the lungs every 4 (four) hours as needed for wheezing or shortness of breath.  11/16/17  Yes [provider]  aspirin-acetaminophen-caffeine (EXCEDRIN MIGRAINE) [id number] MG tablet Take 2 tablets by mouth every 8 (eight) hours as needed for headache.    Yes [provider]  busPIRone (BUSPAR) 15 MG tablet Take 7.5 mg by mouth at bedtime.   Yes [provider]  carbamazepine (TEGRETOL) 200 MG tablet Take 100 mg by mouth 2 (two) times daily.   Yes [provider]  estradiol (VIVELLE-DOT) 0.1 MG/24HR patch Place 1 patch (0.1 mg total) onto the  skin 2 (two) times a week. 02/01/19  Yes Fontaine, Nadyne Coombesimothy P, MD  hydrOXYzine (ATARAX/VISTARIL) 25 MG tablet Take 12.5-25 mg by mouth See admin instructions. 12.5 MG daily and 25 Mg at night   Yes [provider]  levocetirizine (XYZAL) 5 MG tablet Take 5 mg by mouth at bedtime.    Yes [provider]  lisinopril (PRINIVIL,ZESTRIL) 10 MG tablet Take 10 mg by mouth daily. 02/23/17  Yes [provider]  lurasidone (LATUDA) 20 MG TABS tablet Take 20 mg by mouth daily.   Yes [provider]  promethazine (PHENERGAN) 50 MG tablet Take 50 mg by mouth every 6 (six) hours as needed for nausea or vomiting.  10/04/17  Yes [provider]  rosuvastatin (CRESTOR) 20 MG tablet Take 20 mg by mouth daily. 05/21/18  Yes [provider]  trimethoprim (TRIMPEX) 100 MG tablet Take 100 mg by mouth 2 (two) times daily.   Yes [provider]  vitamin C (ASCORBIC ACID)  500 MG tablet Take 500 mg by mouth daily.   Yes [provider]  fluticasone (FLONASE) 50 MCG/ACT nasal spray Place 1 spray into both nostrils daily. Patient not taking: Reported on 04/25/2019 06/05/17   Zigmund Gottron, NP    Physical Exam: BP 120/84    Pulse 89    Temp 98.6 F (37 C) (Oral)    Resp 18    LMP 09/27/2013    SpO2 100%   General: NAD Eyes: Normal ENT: Normal Neck: Supple Cardiovascular: S1, S2 present Respiratory: CTA B Abdomen: Soft, generalized tenderness, nondistended, hypoactive bowel sounds Skin: Normal Musculoskeletal: No pedal edema bilaterally Psychiatric: Normal mood Neurologic: No focal neurologic deficits noted          Labs on Admission:  Basic Metabolic Panel: Recent Labs  Lab 04/25/19 1738  NA 139  K 4.0  CL 102  CO2 25  GLUCOSE 109*  BUN 21*  CREATININE 1.01*  CALCIUM 9.5   Liver Function Tests: Recent Labs  Lab 04/25/19 1738  AST 22  ALT 19  ALKPHOS 68  BILITOT 0.4  PROT 7.9  ALBUMIN 4.8   Recent Labs  Lab  04/25/19 1738  LIPASE 37   No results for input(s): AMMONIA in the last 168 hours. CBC: Recent Labs  Lab 04/25/19 1738  WBC 7.6  HGB 14.2  HCT 44.8  MCV 94.3  PLT 311   Cardiac Enzymes: No results for input(s): CKTOTAL, CKMB, CKMBINDEX, TROPONINI in the last 168 hours.  BNP (last 3 results) No results for input(s): BNP in the last 8760 hours.  ProBNP (last 3 results) No results for input(s): PROBNP in the last 8760 hours.  CBG: No results for input(s): GLUCAP in the last 168 hours.  Radiological Exams on Admission: Ct Abdomen Pelvis W Contrast  Result Date: 04/26/2019 CLINICAL DATA:  38 year old female on antibiotics for UTI without improvement. EXAM: CT ABDOMEN AND PELVIS WITH CONTRAST TECHNIQUE: Multidetector CT imaging of the abdomen and pelvis was performed using the standard protocol following bolus administration of intravenous contrast. CONTRAST:  154mL OMNIPAQUE IOHEXOL 300 MG/ML  SOLN COMPARISON:  Abnormal noncontrast CT Abdomen and Pelvis earlier tonight, possible small bowel obstruction. FINDINGS: Lower chest: Negative. No pericardial or pleural effusion. Hepatobiliary: Negative liver and gallbladder. Pancreas: Negative. Spleen: Negative. Adrenals/Urinary Tract: Normal adrenal glands. Bilateral renal enhancement is symmetric and within normal limits. No perinephric stranding. Proximal ureters appear decompressed. Diminutive and unremarkable urinary bladder. Left side pelvic phleboliths. Stomach/Bowel: Negative descending and rectosigmoid colon. Redundant but otherwise negative transverse colon. Mild retained stool. Negative right colon. The cecum is located in the right hemipelvis. Diminutive or absent appendix. Negative terminal ileum. Decompressed small bowel loops throughout the pelvis. Oral contrast was administered, distends the stomach and multiple dilated loops of small bowel. The duodenum and proximal jejunum remain normal. There is an abrupt transition point  identified in the midline upper abdomen on coronal image 51 and series 2, image 33. This is very near the root of the small bowel mesentery. Distal to this the loops are decompressed. The upstream fluid-filled loops are dilated up to 27 millimeters diameter. No free air. No free fluid identified. Vascular/Lymphatic: Major arterial structures are patent with minimal atherosclerosis. Portal venous system is patent. No lymphadenopathy. Reproductive: Surgically absent uterus and diminutive or absent ovaries. Other: No pelvic free fluid. Musculoskeletal: No acute osseous abnormality identified. There is a left side S3 nerve level sacral stimulator device in place with a left posterior flank generator. IMPRESSION: 1. Positive  for small bowel obstruction with an abrupt transition point in the midline upper abdomen very near the root of the small bowel mesentery. No free fluid or other complicating features. 2. No other acute or inflammatory process identified in the abdomen or pelvis. Electronically Signed   By: Odessa Fleming M.D.   On: 04/26/2019 03:40   Ct Renal Stone Study  Result Date: 04/26/2019 CLINICAL DATA:  Flank pain UTI EXAM: CT ABDOMEN AND PELVIS WITHOUT CONTRAST TECHNIQUE: Multidetector CT imaging of the abdomen and pelvis was performed following the standard protocol without IV contrast. COMPARISON:  CT 08/15/2016 FINDINGS: Lower chest: Lung bases demonstrate no acute consolidation or effusion. The heart size is normal Hepatobiliary: No focal hepatic abnormality. Possible punctate stones within the gallbladder. No biliary dilatation. Pancreas: Unremarkable. No pancreatic ductal dilatation or surrounding inflammatory changes. Spleen: Normal in size without focal abnormality. Adrenals/Urinary Tract: Adrenal glands are unremarkable. Kidneys are normal, without renal calculi, focal lesion, or hydronephrosis. Bladder is unremarkable. Stomach/Bowel: Moderate fluid-filled stomach. Fluid-filled borderline dilated mid  small bowel within the anterior abdomen without well-defined transition point. Negative for bowel wall thickening. Appendix not well seen but no right lower quadrant inflammatory process. Vascular/Lymphatic: No significant vascular findings are present. No enlarged abdominal or pelvic lymph nodes. Reproductive: Status post hysterectomy. No adnexal masses. Other: Negative for free air or free fluid Musculoskeletal: Sacral stimulator on the left side. No acute or suspicious osseous abnormality IMPRESSION: 1. Negative for hydronephrosis or ureteral stone. 2. Fluid-filled borderline distended mid small but without well-defined transition point. Findings could be secondary to mild ileus, though developing or partial obstruction is also considered. Electronically Signed   By: Jasmine Pang M.D.   On: 04/26/2019 00:02    EKG: Pending  Assessment/Plan Present on Admission:  SBO (small bowel obstruction) (HCC)  INTERSTITIAL CYSTITIS  IBS  Other bipolar disorder (HCC)  UTI  Principal Problem:   SBO (small bowel obstruction) (HCC) Active Problems:   Other bipolar disorder (HCC)   IBS   INTERSTITIAL CYSTITIS   UTI   SBO Likely 2/2 adhesions 2/2 multiple abdominal surgery CT abdomen pelvis showed SBO N.p.o., NG tube, IV fluids, pain management General surgery consulted Further management per surgery   Recurrent UTI/interstitial cystitis Currently afebrile, with no leukocytosis UA positive for nitrites, few bacteria, mucus, hematuria UC pending Start IV ceftriaxone, pending culture results  Bipolar/anxiety Continue BuSpar, Tegretol, Atarax, Latuda     DVT prophylaxis: Lovenox  Code Status: Full  Family Communication: Mother at bedside  Disposition Plan: To be determined  Consults called: General surgery  Admission status: Inpatient    Briant Cedar MD [facility]   If 7PM-7AM, please contact night-coverage [email or web address]  04/26/2019, 10:17 AM

## 2019-04-26 NOTE — ED Notes (Signed)
RN has informed Hospitalist.

## 2019-04-26 NOTE — ED Provider Notes (Signed)
38 year old female received a signout from PA [place] pending CT abdomen pelvis.  Per her HPI:  "[name] is a 38 y.o. female with a past medical history of recurrent UTIs, interstitial cystitis, GERD, IBS, anxiety, and bipolar disorder who presents with bilateral flank pain and suprapubic abdominal pain that began yesterday. Patient describes the pain as stabbing waxing and waning pain rated 9/10. Denies aggravating or alleviating factors. She reports associated urinary frequency for the last 3 months. She was last seen by her urologist one week ago for this symptom and was told at the time that she had a UTI. She was prescribed Nitrofurantoin 100 mg BID x 7 days and completed the course yesterday. She called her urologist's office to inform them two days ago that the Nitrofurantoin was not alleviating her symptoms and they prescribed Trimethoprim 100mg  BID x 7 days.  Patient came into the ED today due to her pain that was unrelieved despite taking Aleve OTC and her mother's Tramadol. She reports subjective fever, chills, decreased appetite, and dysuria. Patient denies headache, CP, SOB, N/V, diarrhea, or hematuria."   Physical Exam  BP (!) 138/107   Pulse 93   Temp 98.6 F (37 C) (Oral)   Resp 15   LMP 09/27/2013   SpO2 100%   Physical Exam Vitals signs and nursing note reviewed.  Constitutional:      General: She is not in acute distress.    Appearance: She is not ill-appearing, toxic-appearing or diaphoretic.  HENT:     Head: Normocephalic.  Eyes:     Conjunctiva/sclera: Conjunctivae normal.  Neck:     Musculoskeletal: Neck supple.  Cardiovascular:     Rate and Rhythm: Normal rate and regular rhythm.     Heart sounds: No murmur. No friction rub. No gallop.   Pulmonary:     Effort: Pulmonary effort is normal. No respiratory distress.  Abdominal:     General: There is no distension.     Palpations: Abdomen is soft. There is no mass.     Tenderness: There is abdominal  tenderness. There is no right CVA tenderness, left CVA tenderness, guarding or rebound.     Hernia: No hernia is present.  Skin:    General: Skin is warm.     Findings: No rash.  Neurological:     Mental Status: She is alert.  Psychiatric:        Behavior: Behavior normal.     Comments: Rapid, pressured speech     ED Course/Procedures   Clinical Course as of Apr 25 708  Thu Apr 26, 2019  Apr 28, 2019 Spoke with patient's nurse.  There has been significant delay with getting NG-tube placed as patient is very anxious.  She has been given IM Ativan and nurse will reattempt.   [MM]    Clinical Course User Index [MM] Ann Bohne A, PA-C    Procedures  MDM   38 year old female received at signout from PA [place] pending CT abdomen pelvis with contrast.  Please see her note for further work-up and medical decision making.  CT positive for small bowel obstruction with an abrupt transition point in the midline upper abdomen near the root of the small bowel mesentery.  No free fluid or other complicating features.  NG tube has been ordered.  She has been given morphine for pain control.  COVID-19 test has been ordered.  Spoke with Dr. [place], general surgery.  General surgery will come to evaluate the patient.  Consult to the hospitalist  team and Dr. Hal Hope will admit.  While in the ER, the patient developed nausea, which significantly improved with Zofran.  Nursing staff reports multiple attempts at NG tube placement, but patient has been anxious, despite improvement in pain control with morphine.  Ativan has been ordered and nursing staff will reattempt.  The patient appears reasonably stabilized for admission considering the current resources, flow, and capabilities available in the ED at this time, and I doubt any other [facility] requiring further screening and/or treatment in the ED prior to admission.     Joanne Gavel, PA-C 04/26/19 [place], [name], MD 04/26/19 [phone]

## 2019-04-26 NOTE — ED Notes (Signed)
Pt given ativan prior to attempting to place an NG tube

## 2019-04-26 NOTE — ED Notes (Signed)
RN attempted to call report to Marita Kansas RN, Agricultural consultant for [employer], Room [id number] however RN was providing care to another patient. RN Marita Kansas will call ED at [phone] and ask for Janace Decker to receive report when finished with other patient.

## 2019-04-26 NOTE — ED Notes (Addendum)
22cm of NG tube have been taken out. And KUB will be x-rayed again.

## 2019-04-26 NOTE — Consult Note (Signed)
[name] September 21, 1980  [id number].    Requesting MD: Dr. Roel Cluck Chief Complaint/Reason for Consult: SBO  HPI:  This is a 38 year old white female with a history of bipolar disorder, interstitial cystitis, and hypertension who has been having symptoms consistent with a UTI for over a week.  This is a chronic thing for her.  She was given 7 days worth of nitrofurantoin but the patient states that this did not seem to make her symptoms better.  She then called for more antibiotics but due to worsening abdominal pain decided to come to the emergency department yesterday.  She does state that she has had some nausea and vomiting.  Her last bowel movement was 2 days ago and she describes this as rabbit pellet-like.  She does not know when the last time she passed gas was.  In the emergency room she has been evaluated and found to have a CT scan consistent with a small bowel obstruction.  We have been asked to evaluate her for further recommendations.  ROS: ROS: Please see HPI, otherwise all other systems have been reviewed and are negative.  Family History  Problem Relation Age of Onset   Hypertension Mother    Esophageal cancer Maternal Grandmother    Hypertension Maternal Grandmother    Heart disease Maternal Grandmother    Stomach cancer Maternal Grandmother    Breast cancer Maternal Grandmother 79   Diabetes Maternal Grandfather    Stroke Maternal Grandfather    Depression Maternal Grandfather    Anxiety disorder Maternal Grandfather    Hypertension Maternal Grandfather    Heart disease Maternal Grandfather     Past Medical History:  Diagnosis Date   Anxiety    Bipolar 1 disorder ([place])    Bipolar 1 disorder ([facility])    Chronic back pain    Endometriosis    Galactorrhea    GERD (gastroesophageal reflux disease)    Hypertension    IBS (irritable bowel syndrome)    IC (interstitial cystitis)    Insomnia    Interstitial cystitis    Mental  disorder    Migraine    PTSD (post-traumatic stress disorder)    Seasonal allergies    Sinusitis     Past Surgical History:  Procedure Laterality Date   ABDOMINAL HYSTERECTOMY  10/16/2013   ABDOMINAL SURGERY     BLADDER SURGERY     distenstion numerous times   BOWEL RESECTION  1998   prolapsed bowel   BUNIONECTOMY     FOOT SURGERY     bunionectomy bil. pins put in and taken out   INTERSTIM IMPLANT PLACEMENT     INTRAUTERINE DEVICE INSERTION  2 yrs ago   Mirena   LAPAROSCOPY Right 06/13/2013   Procedure: LAPAROSCOPY OPERATIVE WITH OVARIAN CYSTECTOMY convert to open 1329;  Surgeon: Delice Lesch, MD;  Location: [facility];  Service: Gynecology;  Laterality: Right;   LAPAROTOMY Right 06/13/2013   Procedure: EXPLORATORY LAPAROTOMY, ovarian cyst;  Surgeon: Delice Lesch, MD;  Location: [facility];  Service: Gynecology;  Laterality: Right;   OOPHORECTOMY     BSO   TUBAL LIGATION      Social History:  reports that she quit smoking about 17 years ago. Her smoking use included cigars. She quit after 2.50 years of use. She has never used smokeless tobacco. She reports current alcohol use. She reports that she does not use drugs.  Allergies:  Allergies  Allergen Reactions   Gabapentin Other (See Comments)  Reaction:  Suicidal thoughts    Hydrocodone Shortness Of Breath   Methadone Other (See Comments)    Confusion/fainting   Augmentin [Amoxicillin-Pot Clavulanate] Nausea And Vomiting and Other (See Comments)    Has patient had a PCN reaction causing immediate rash, facial/tongue/throat swelling, SOB or lightheadedness with hypotension: No Has patient had a PCN reaction causing severe rash involving mucus membranes or skin necrosis: No Has patient had a PCN reaction that required hospitalization No Has patient had a PCN reaction occurring within the last 10 years: No If all of the above answers are "NO", then may proceed with Cephalosporin use.   Azithromycin  Other (See Comments)    Pt states that this medication does not work for her.     Buprenorphine Itching   Ciprofloxacin Nausea Only   Erythromycin Nausea And Vomiting   Garlic Other (See Comments)    Reaction:  Bladder issues    Hydrocodone-Acetaminophen Other (See Comments)    Pt states that this medication made her pass out.     Methenamine Other (See Comments)    Reaction:  Urinary retention    Morphine And Related Itching   Onion Nausea And Vomiting   Prozac [Fluoxetine Hcl] Other (See Comments)    Reaction:  Suicidal thoughts    Pyridium [Phenazopyridine Hcl] Other (See Comments)    Reaction:  Bladder pain    Uribel [Meth-Hyo-M Bl-Na Phos-Ph Sal] Other (See Comments)    Reaction:  Bladder issues     (Not in a hospital admission)    Physical Exam: Blood pressure 111/79, pulse 94, temperature 98.6 F (37 C), temperature source Oral, resp. rate 19, last menstrual period 09/27/2013, SpO2 96 %. General: pleasant, WD, WN white female who is laying in bed in NAD HEENT: head is normocephalic, atraumatic.  Sclera are noninjected.  PERRL.  Ears and nose without any masses or lesions.  NGT in place but isn't working currently.  Awaiting confirmatory film.  Mouth is pink and moist Heart: regular, rate, and rhythm.  Normal s1,s2. No obvious murmurs, gallops, or rubs noted.  Palpable radial and pedal pulses bilaterally Lungs: CTAB, no wheezes, rhonchi, or rales noted.  Respiratory effort nonlabored Abd: soft, minimal diffuse tenderness, no guarding or rebounding.  Mild distention, hypoactive BS, no masses, hernias, or organomegaly.  Lower midline scar noted from prior surgical intervention. MS: all 4 extremities are symmetrical with no cyanosis, clubbing, or edema. Skin: warm and dry with no masses, lesions, or rashes Psych: A&Ox3 with an appropriate affect.   Results for orders placed or performed during the hospital encounter of 04/25/19 (from the past 48 hour(s))    Urinalysis, Routine w reflex microscopic     Status: Abnormal   Collection Time: 04/25/19  5:33 PM  Result Value Ref Range   Color, Urine AMBER (A) YELLOW    Comment: BIOCHEMICALS MAY BE AFFECTED BY COLOR   APPearance HAZY (A) CLEAR   Specific Gravity, Urine 1.027 1.005 - 1.030   pH 5.0 5.0 - 8.0   Glucose, UA NEGATIVE NEGATIVE mg/dL   Hgb urine dipstick NEGATIVE NEGATIVE   Bilirubin Urine NEGATIVE NEGATIVE   Ketones, ur NEGATIVE NEGATIVE mg/dL   Protein, ur 30 (A) NEGATIVE mg/dL   Nitrite POSITIVE (A) NEGATIVE   Leukocytes,Ua NEGATIVE NEGATIVE   RBC / HPF 11-20 0 - 5 RBC/hpf   WBC, UA 0-5 0 - 5 WBC/hpf   Bacteria, UA FEW (A) NONE SEEN   Squamous Epithelial / LPF 6-10 0 - 5  Mucus PRESENT     Comment: Performed at [facility], [address] [place], [place] [address]  Lipase, blood     Status: None   Collection Time: 04/25/19  5:38 PM  Result Value Ref Range   Lipase 37 11 - 51 U/L    Comment: Performed at [facility], [address] [place], [place] [address]  Comprehensive metabolic panel     Status: Abnormal   Collection Time: 04/25/19  5:38 PM  Result Value Ref Range   Sodium 139 135 - 145 mmol/L   Potassium 4.0 3.5 - 5.1 mmol/L   Chloride 102 98 - 111 mmol/L   CO2 25 22 - 32 mmol/L   Glucose, Bld 109 (H) 70 - 99 mg/dL   BUN 21 (H) 6 - 20 mg/dL   Creatinine, Ser 2.951.01 (H) 0.44 - 1.00 mg/dL   Calcium 9.5 8.9 - 28.410.3 mg/dL   Total Protein 7.9 6.5 - 8.1 g/dL   Albumin 4.8 3.5 - 5.0 g/dL   AST 22 15 - 41 U/L   ALT 19 0 - 44 U/L   Alkaline Phosphatase 68 38 - 126 U/L   Total Bilirubin 0.4 0.3 - 1.2 mg/dL   GFR calc non Af Amer >60 >60 mL/min   GFR calc Af Amer >60 >60 mL/min   Anion gap 12 5 - 15    Comment: Performed at [facility], [address] [place], [place] [address]  CBC     Status: None   Collection Time: 04/25/19  5:38 PM  Result Value Ref Range   WBC 7.6 4.0 - 10.5 K/uL   RBC 4.75 3.87 -  5.11 MIL/uL   Hemoglobin 14.2 12.0 - 15.0 g/dL   HCT 01.044.8 27.236.0 - 53.646.0 %   MCV 94.3 80.0 - 100.0 fL   MCH 29.9 26.0 - 34.0 pg   MCHC 31.7 30.0 - 36.0 g/dL   RDW 64.411.9 03.411.5 - 74.215.5 %   Platelets 311 150 - 400 K/uL   nRBC 0.0 0.0 - 0.2 %    Comment: Performed at [facility], [address] [place], [place] [address]  SARS Coronavirus 2 by RT PCR (hospital order, performed in [facility] hospital lab) Nasopharyngeal Nasopharyngeal Swab     Status: None   Collection Time: 04/26/19  4:48 AM   Specimen: Nasopharyngeal Swab  Result Value Ref Range   SARS Coronavirus 2 NEGATIVE NEGATIVE    Comment: (NOTE) If result is NEGATIVE SARS-CoV-2 target nucleic acids are NOT DETECTED. The SARS-CoV-2 RNA is generally detectable in upper and lower  respiratory specimens during the acute phase of infection. The lowest  concentration of SARS-CoV-2 viral copies this assay can detect is 250  copies / mL. A negative result does not preclude SARS-CoV-2 infection  and should not be used as the sole basis for treatment or other  patient management decisions.  A negative result may occur with  improper specimen collection / handling, submission of specimen other  than nasopharyngeal swab, presence of viral mutation(s) within the  areas targeted by this assay, and inadequate number of viral copies  (<250 copies / mL). A negative result must be combined with clinical  observations, patient history, and epidemiological information. If result is POSITIVE SARS-CoV-2 target nucleic acids are DETECTED. The SARS-CoV-2 RNA is generally detectable in upper and lower  respiratory specimens dur ing the acute phase of infection.  Positive  results are indicative of active infection with  SARS-CoV-2.  Clinical  correlation with patient history and other diagnostic information is  necessary to determine patient infection status.  Positive results do  not rule out bacterial infection or co-infection with  other viruses. If result is PRESUMPTIVE POSTIVE SARS-CoV-2 nucleic acids MAY BE PRESENT.   A presumptive positive result was obtained on the submitted specimen  and confirmed on repeat testing.  While 2019 novel coronavirus  (SARS-CoV-2) nucleic acids may be present in the submitted sample  additional confirmatory testing may be necessary for epidemiological  and / or clinical management purposes  to differentiate between  SARS-CoV-2 and other Sarbecovirus currently known to infect humans.  If clinically indicated additional testing with an alternate test  methodology [phone]) is advised. The SARS-CoV-2 RNA is generally  detectable in upper and lower respiratory sp ecimens during the acute  phase of infection. The expected result is Negative. Fact Sheet for Patients:  BoilerBrush.com.cy Fact Sheet for Healthcare Providers: https://pope.com/ This test is not yet approved or cleared by the [place] FDA and has been authorized for detection and/or diagnosis of SARS-CoV-2 by FDA under an Emergency Use Authorization (EUA).  This EUA will remain in effect (meaning this test can be used) for the duration of the COVID-19 declaration under Section 564(b)(1) of the Act, 21 U.S.C. section 360bbb-3(b)(1), unless the authorization is terminated or revoked sooner. Performed at [facility], [address], [place], [place] [address]    Ct Abdomen Pelvis W Contrast  Result Date: 04/26/2019 CLINICAL DATA:  38 year old female on antibiotics for UTI without improvement. EXAM: CT ABDOMEN AND PELVIS WITH CONTRAST TECHNIQUE: Multidetector CT imaging of the abdomen and pelvis was performed using the standard protocol following bolus administration of intravenous contrast. CONTRAST:  OMNIPAQUE IOHEXOL 300 MG/ML  SOLN COMPARISON:  Abnormal noncontrast CT Abdomen and Pelvis earlier tonight, possible small bowel obstruction.  FINDINGS: Lower chest: Negative. No pericardial or pleural effusion. Hepatobiliary: Negative liver and gallbladder. Pancreas: Negative. Spleen: Negative. Adrenals/Urinary Tract: Normal adrenal glands. Bilateral renal enhancement is symmetric and within normal limits. No perinephric stranding. Proximal ureters appear decompressed. Diminutive and unremarkable urinary bladder. Left side pelvic phleboliths. Stomach/Bowel: Negative descending and rectosigmoid colon. Redundant but otherwise negative transverse colon. Mild retained stool. Negative right colon. The cecum is located in the right hemipelvis. Diminutive or absent appendix. Negative terminal ileum. Decompressed small bowel loops throughout the pelvis. Oral contrast was administered, distends the stomach and multiple dilated loops of small bowel. The duodenum and proximal jejunum remain normal. There is an abrupt transition point identified in the midline upper abdomen on coronal image 51 and series 2, image 33. This is very near the root of the small bowel mesentery. Distal to this the loops are decompressed. The upstream fluid-filled loops are dilated up to 27 millimeters diameter. No free air. No free fluid identified. Vascular/Lymphatic: Major arterial structures are patent with minimal atherosclerosis. Portal venous system is patent. No lymphadenopathy. Reproductive: Surgically absent uterus and diminutive or absent ovaries. Other: No pelvic free fluid. Musculoskeletal: No acute osseous abnormality identified. There is a left side S3 nerve level sacral stimulator device in place with a left posterior flank generator. IMPRESSION: 1. Positive for small bowel obstruction with an abrupt transition point in the midline upper abdomen very near the root of the small bowel mesentery. No free fluid or other complicating features. 2. No other acute or inflammatory process identified in the abdomen or pelvis. Electronically Signed   By: Odessa Fleming M.D.   On:  04/26/2019  03:40   Ct Renal Stone Study  Result Date: 04/26/2019 CLINICAL DATA:  Flank pain UTI EXAM: CT ABDOMEN AND PELVIS WITHOUT CONTRAST TECHNIQUE: Multidetector CT imaging of the abdomen and pelvis was performed following the standard protocol without IV contrast. COMPARISON:  CT 08/15/2016 FINDINGS: Lower chest: Lung bases demonstrate no acute consolidation or effusion. The heart size is normal Hepatobiliary: No focal hepatic abnormality. Possible punctate stones within the gallbladder. No biliary dilatation. Pancreas: Unremarkable. No pancreatic ductal dilatation or surrounding inflammatory changes. Spleen: Normal in size without focal abnormality. Adrenals/Urinary Tract: Adrenal glands are unremarkable. Kidneys are normal, without renal calculi, focal lesion, or hydronephrosis. Bladder is unremarkable. Stomach/Bowel: Moderate fluid-filled stomach. Fluid-filled borderline dilated mid small bowel within the anterior abdomen without well-defined transition point. Negative for bowel wall thickening. Appendix not well seen but no right lower quadrant inflammatory process. Vascular/Lymphatic: No significant vascular findings are present. No enlarged abdominal or pelvic lymph nodes. Reproductive: Status post hysterectomy. No adnexal masses. Other: Negative for free air or free fluid Musculoskeletal: Sacral stimulator on the left side. No acute or suspicious osseous abnormality IMPRESSION: 1. Negative for hydronephrosis or ureteral stone. 2. Fluid-filled borderline distended mid small but without well-defined transition point. Findings could be secondary to mild ileus, though developing or partial obstruction is also considered. Electronically Signed   By: Jasmine Pang M.D.   On: 04/26/2019 00:02      Assessment/Plan HTN Interstitial cystitis Bipolar disorder  Small bowel obstruction The patient appears to have a small bowel obstruction on her CT scan.  She does have multiple prior abdominal surgeries.  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  She has a very small NG tube in place which was all they can get in.  We will see if we can get this working.  If so, we will proceed with the small bowel obstruction protocol in hopes of getting the patient better with conservative management.  We did discuss this as well as the possibility for the need for exploration if she fails to improve with conservative management.  The patient states she understands and is agreeable to proceed with this plan.  FEN - NPO/NGT VTE - per medicine, ok for prophylaxis from our standpoint ID - none needed for SBO  Letha Cape, [facility] Surgery 04/26/2019, 12:55 PM Please see Amion for pager number during day hours 7:00am-4:30pm

## 2019-04-26 NOTE — ED Notes (Signed)
RN has asked Nurse Secretary to page General Surgery.

## 2019-04-26 NOTE — Progress Notes (Signed)
Patient vomiting and NG tube came out  while getting report from day shift RN, attempted to reinsert and was successful, Abdominal X ray was ordered for placement verification. We will continue to monitor.

## 2019-04-26 NOTE — ED Notes (Signed)
RN Mandie attempted 2x and RN Rayaan Garguilo attempted x1 for NG tube placement and were unsuccessful. Patient did have Nose bleed and during the third attempt tube began to go down Trachea and was immediately pulled back and out. RN is going to Science writer.

## 2019-04-26 NOTE — ED Notes (Signed)
RN has spoken to General Surgery and patient will be going for IR placement of NG-Tube.

## 2019-04-27 ENCOUNTER — Inpatient Hospital Stay (HOSPITAL_COMMUNITY): Payer: Medicare Other

## 2019-04-27 LAB — CBC
HCT: 33.2 % — ABNORMAL LOW (ref 36.0–46.0)
Hemoglobin: 11.2 g/dL — ABNORMAL LOW (ref 12.0–15.0)
MCH: 30.9 pg (ref 26.0–34.0)
MCHC: 33.7 g/dL (ref 30.0–36.0)
MCV: 91.7 fL (ref 80.0–100.0)
Platelets: 215 10*3/uL (ref 150–400)
RBC: 3.62 MIL/uL — ABNORMAL LOW (ref 3.87–5.11)
RDW: 12 % (ref 11.5–15.5)
WBC: 9.4 10*3/uL (ref 4.0–10.5)
nRBC: 0 % (ref 0.0–0.2)

## 2019-04-27 LAB — BASIC METABOLIC PANEL
Anion gap: 8 (ref 5–15)
BUN: 13 mg/dL (ref 6–20)
CO2: 22 mmol/L (ref 22–32)
Calcium: 8.1 mg/dL — ABNORMAL LOW (ref 8.9–10.3)
Chloride: 108 mmol/L (ref 98–111)
Creatinine, Ser: 0.61 mg/dL (ref 0.44–1.00)
GFR calc Af Amer: >60 mL/min (ref >60)
GFR calc non Af Amer: >60 mL/min (ref >60)
Glucose, Bld: 97 mg/dL (ref 70–99)
Potassium: 3.6 mmol/L (ref 3.5–5.1)
Sodium: 138 mmol/L (ref 135–145)

## 2019-04-27 NOTE — Progress Notes (Signed)
[facility]  PROGRESS NOTE  Mykira L Nack ZOX:[id number] DOB: [birth year] DOA: 04/25/2019 PCP: [facility]  Brief History   Guynell L Crandle is a 38 y.o. female with medical history significant for recurrent UTI, interstitial cystitis, anxiety/bipolar, GERD, IBD, presented to the ED on 10/14 complaining of abdominal pain located around her bilateral flank region as well as suprapubic area.  Reports pain as intermittent, sharp, denies any radiation or aggravating alleviating factors.  Patient has a history of recurrent UTI and follows up with her urologist, recently prescribed nitrofurantoin 10/7) with minimal improvement in her symptoms.  Culture was negative at that time, Bactrim was prescribed on 10/12 and patient had been taking on current presentation.    Despite taking the antibiotics, as well as over-the-counter pain meds, patient continues to have abdominal pain, reported some subjective fever/chills as well as dysuria.  Patient denies any chest pain, shortness of breath, cough, nausea/vomiting, diarrhea.  Patient reported last bowel movement was about 2 days ago, unsure if she is passing gas.  ED Course: Vital signs stable other than tachycardia with heart rate in the 120s.  Labs unremarkable, UA showed positive nitrites, few bacteria, hematuria, mucus.  UC pending.  CT renal stone negative for hydronephrosis/ureteral stone, bladder unremarkable.  CT abdomen positive for SBO, otherwise unremarkable.  Multiple attempts to place NG tube.  General surgery was consulted.  [facility]ist asked to admit patient  A & P     Small bowel obstruction secondary to adhesions, improving.  Briefly required NG tube and bowel rest, now passing flatus and belly pain much improved.  Repeat x-ray shows improvement in obstruction.  Surgery following, will advance diet as tolerated closely monitor.   Dysuria in patient with history of recurrent UTIs in setting of chronic interstitial  cystitis and overactive bladder.  Previously on nitrofurantoin (10/7) with little improvement and transition to Bactrim for 2 days.  UA positive nitrates, negative leukocytes, 0-5 WBC.  Currently on IV ceftriaxone with improvement in urinary symptoms, urine culture shows less than 10,000 colonies. Dc ceftriaxone this is likely related to interstitial cystitis   Bipolar disorder with anxiety, stable.  Continue home BuSpar, Tegretol, Atarax, Latuda     DVT prophylaxis: Lovenox Code Status: Full code  Family Communication: No family at bedside Disposition Plan: Monitor serial abdominal exams while advancing diet, anticipate discharge next 24 hours     [facility] Direct contact: see [email or web address] (further directions at bottom of note if needed) 7PM-7AM contact night coverage as at bottom of note 04/27/2019, 2:22 PM  LOS: 1 day   Consultants   Surgery  Procedures   None  Antibiotics   IV ceftriaxone 10/15-10/16  Interval History/Subjective  Feels urinary symptoms have resolved Only minimal abdominal pain Eager to advance diet  Objective   Vitals:  Vitals:   04/27/19 0452 04/27/19 1400  BP: 115/76 99/66  Pulse: 98 80  Resp: 18 18  Temp: 98.7 F (37.1 C) 98.6 F (37 C)  SpO2: 97% 99%    Exam: Awake, alert, oriented x4 No respiratory effort on room air, clear breath sounds Regular rate and rhythm, no murmurs rubs or gallops, no edema Abdomen soft, nondistended, tender with deep palpation, no rebound tenderness or guarding, normal bowel sounds No rash or bruising Normal affect, normal mood   I have personally reviewed the following:   Data Reviewed: Basic Metabolic Panel: Recent Labs  Lab 04/25/19 1738 04/27/19 0323  NA 139 138  K 4.0 3.6  CL 102  108  CO2 25 22  GLUCOSE 109* 97  BUN 21* 13  CREATININE 1.01* 0.61  CALCIUM 9.5 8.1*   Liver Function Tests: Recent Labs  Lab 04/25/19 1738  AST 22  ALT 19  ALKPHOS 68  BILITOT 0.4  PROT 7.9   ALBUMIN 4.8   Recent Labs  Lab 04/25/19 1738  LIPASE 37   No results for input(s): AMMONIA in the last 168 hours. CBC: Recent Labs  Lab 04/25/19 1738 04/27/19 0323  WBC 7.6 9.4  HGB 14.2 11.2*  HCT 44.8 33.2*  MCV 94.3 91.7  PLT 311 215   Cardiac Enzymes: No results for input(s): CKTOTAL, CKMB, CKMBINDEX, TROPONINI in the last 168 hours. BNP (last 3 results) No results for input(s): BNP in the last 8760 hours.  ProBNP (last 3 results) No results for input(s): PROBNP in the last 8760 hours.  CBG: No results for input(s): GLUCAP in the last 168 hours.  Recent Results (from the past 240 hour(s))  Urine culture     Status: Abnormal   Collection Time: 04/25/19  6:47 PM   Specimen: Urine, Clean Catch  Result Value Ref Range Status   Specimen Description   Final    URINE, CLEAN CATCH Performed at [facility], [address] [place], [place] [address]    Special Requests   Final    NONE Performed at [facility], [address], [place], [place] [address]    Culture (A)  Final    <10,000 COLONIES/mL INSIGNIFICANT GROWTH Performed at [facility], [address] [place], [place] [address]    Report Status 04/26/2019 FINAL  Final  SARS Coronavirus 2 by RT PCR (hospital order, performed in [facility] hospital lab) Nasopharyngeal Nasopharyngeal Swab     Status: None   Collection Time: 04/26/19  4:48 AM   Specimen: Nasopharyngeal Swab  Result Value Ref Range Status   SARS Coronavirus 2 NEGATIVE NEGATIVE Final    Comment: (NOTE) If result is NEGATIVE SARS-CoV-2 target nucleic acids are NOT DETECTED. The SARS-CoV-2 RNA is generally detectable in upper and lower  respiratory specimens during the acute phase of infection. The lowest  concentration of SARS-CoV-2 viral copies this assay can detect is 250  copies / mL. A negative result does not preclude SARS-CoV-2 infection  and should not be used as the sole basis for  treatment or other  patient management decisions.  A negative result may occur with  improper specimen collection / handling, submission of specimen other  than nasopharyngeal swab, presence of viral mutation(s) within the  areas targeted by this assay, and inadequate number of viral copies  (<250 copies / mL). A negative result must be combined with clinical  observations, patient history, and epidemiological information. If result is POSITIVE SARS-CoV-2 target nucleic acids are DETECTED. The SARS-CoV-2 RNA is generally detectable in upper and lower  respiratory specimens dur ing the acute phase of infection.  Positive  results are indicative of active infection with SARS-CoV-2.  Clinical  correlation with patient history and other diagnostic information is  necessary to determine patient infection status.  Positive results do  not rule out bacterial infection or co-infection with other viruses. If result is PRESUMPTIVE POSTIVE SARS-CoV-2 nucleic acids MAY BE PRESENT.   A presumptive positive result was obtained on the submitted specimen  and confirmed on repeat testing.  While 2019 novel coronavirus  (SARS-CoV-2) nucleic acids may be present in the submitted sample  additional confirmatory testing may  be necessary for epidemiological  and / or clinical management purposes  to differentiate between  SARS-CoV-2 and other Sarbecovirus currently known to infect humans.  If clinically indicated additional testing with an alternate test  methodology [phone]([id number]) is advised. The SARS-CoV-2 RNA is generally  detectable in upper and lower respiratory sp ecimens during the acute  phase of infection. The expected result is Negative. Fact Sheet for Patients:  BoilerBrush.com.cyhttps://www.fda.gov/media/136312/download Fact Sheet for Healthcare Providers: https://pope.com/https://www.fda.gov/media/136313/download This test is not yet approved or cleared by the [place] FDA and has been authorized for detection and/or  diagnosis of SARS-CoV-2 by FDA under an Emergency Use Authorization (EUA).  This EUA will remain in effect (meaning this test can be used) for the duration of the COVID-19 declaration under Section 564(b)(1) of the Act, 21 U.S.C. section 360bbb-3(b)(1), unless the authorization is terminated or revoked sooner. Performed at [facility], [address] [place], [place] [address]      Studies: Dg Abdomen 1 View  Result Date: 04/26/2019 CLINICAL DATA:  Nasogastric tube repositioning EXAM: ABDOMEN - 1 VIEW COMPARISON:  Same-day radiograph FINDINGS: Nasogastric tube is again seen coiled upon itself with the distal tip located within the distal esophagus. The bowel gas pattern is normal. No radio-opaque calculi or other significant radiographic abnormality are seen. IMPRESSION: 1. Nasogastric tube is coiled upon itself with the distal tip located within the distal esophagus. Recommend repositioning. 2. Unremarkable bowel gas pattern. Electronically Signed   By: Duanne GuessNicholas  Plundo M.D.   On: 04/26/2019 14:31   Dg Abdomen 1 View  Result Date: 04/26/2019 CLINICAL DATA:  NG tube placement. EXAM: ABDOMEN - 1 VIEW COMPARISON:  Earlier the same day. FINDINGS: The bowel gas pattern is normal. No radio-opaque calculi or other significant radiographic abnormality are seen. Enteric catheter is coiled back on itself with distal tip overlying the distal esophagus. IMPRESSION: 1. Enteric catheter is coiled back on itself with distal tip overlying the distal esophagus. 2. Nonobstructive bowel gas pattern. Electronically Signed   By: Ted Mcalpineobrinka  Dimitrova M.D.   On: 04/26/2019 13:56   Dg Abdomen 1 View  Result Date: 04/26/2019 CLINICAL DATA:  NG tube placement. EXAM: ABDOMEN - 1 VIEW COMPARISON:  CT 04/26/2019. FINDINGS: NG tube noted with its tip coiled back into the esophagus. Repositioning should be considered. No bowel distention. IMPRESSION: NG tube noted with its tip coiled back into the  esophagus. Repositioning should be considered. Electronically Signed   By: Maisie Fushomas  Register   On: 04/26/2019 13:06   Ct Abdomen Pelvis W Contrast  Result Date: 04/26/2019 CLINICAL DATA:  38 year old female on antibiotics for UTI without improvement. EXAM: CT ABDOMEN AND PELVIS WITH CONTRAST TECHNIQUE: Multidetector CT imaging of the abdomen and pelvis was performed using the standard protocol following bolus administration of intravenous contrast. CONTRAST:  100mL OMNIPAQUE IOHEXOL 300 MG/ML  SOLN COMPARISON:  Abnormal noncontrast CT Abdomen and Pelvis earlier tonight, possible small bowel obstruction. FINDINGS: Lower chest: Negative. No pericardial or pleural effusion. Hepatobiliary: Negative liver and gallbladder. Pancreas: Negative. Spleen: Negative. Adrenals/Urinary Tract: Normal adrenal glands. Bilateral renal enhancement is symmetric and within normal limits. No perinephric stranding. Proximal ureters appear decompressed. Diminutive and unremarkable urinary bladder. Left side pelvic phleboliths. Stomach/Bowel: Negative descending and rectosigmoid colon. Redundant but otherwise negative transverse colon. Mild retained stool. Negative right colon. The cecum is located in the right hemipelvis. Diminutive or absent appendix. Negative terminal ileum. Decompressed small bowel loops throughout the pelvis. Oral contrast was administered, distends the stomach and multiple dilated  loops of small bowel. The duodenum and proximal jejunum remain normal. There is an abrupt transition point identified in the midline upper abdomen on coronal image 51 and series 2, image 33. This is very near the root of the small bowel mesentery. Distal to this the loops are decompressed. The upstream fluid-filled loops are dilated up to 27 millimeters diameter. No free air. No free fluid identified. Vascular/Lymphatic: Major arterial structures are patent with minimal atherosclerosis. Portal venous system is patent. No lymphadenopathy.  Reproductive: Surgically absent uterus and diminutive or absent ovaries. Other: No pelvic free fluid. Musculoskeletal: No acute osseous abnormality identified. There is a left side S3 nerve level sacral stimulator device in place with a left posterior flank generator. IMPRESSION: 1. Positive for small bowel obstruction with an abrupt transition point in the midline upper abdomen very near the root of the small bowel mesentery. No free fluid or other complicating features. 2. No other acute or inflammatory process identified in the abdomen or pelvis. Electronically Signed   By: Odessa Fleming M.D.   On: 04/26/2019 03:40   Dg Abd Portable 1v-small Bowel Obstruction Protocol-initial, 8 Hr Delay  Result Date: 04/27/2019 CLINICAL DATA:  Small-bowel protocol EXAM: PORTABLE ABDOMEN - 1 VIEW COMPARISON:  Radiograph 04/26/2019 FINDINGS: Interval removal of the transesophageal tube seen on comparison. A nerve stimulator battery pack overlies the left hip with single lead overlying the left sacrum. Contrast material has traversed to the level of the rectum. Few nondistended loops of air filled small bowel remain in the mid abdomen. No suspicious calcifications. Phleboliths in the pelvis. No acute osseous or soft tissue abnormality. IMPRESSION: Contrast has traversed to the level of the rectum without residual air distended loops of small bowel to suggest high-grade obstruction. Electronically Signed   By: Kreg Shropshire M.D.   On: 04/27/2019 02:59   Dg Abd Portable 1v  Result Date: 04/26/2019 CLINICAL DATA:  Vomiting.  Nasogastric tube placement. EXAM: PORTABLE ABDOMEN - 1 VIEW COMPARISON:  Prior today FINDINGS: The nasogastric tube is now been advanced with the tip in the fundus of the stomach. No evidence of dilated bowel loops. IMPRESSION: Nasogastric tube tip now overlies the fundus of the stomach. Electronically Signed   By: Danae Orleans M.D.   On: 04/26/2019 20:07   Dg Georjean Mode Tube  Result Date:  04/26/2019 CLINICAL DATA:  Need for gastric tube. EXAM: FL FEEDING TUBE PLACEMENT CONTRAST:  None FLUOROSCOPY TIME:  Fluoroscopy Time:  36 seconds Radiation Exposure Index (if provided by the fluoroscopic device): 3 mGy Number of Acquired Spot Images: 0 COMPARISON:  None. FINDINGS: The enteric tube is now in the stomach. Initial fluoroscopic, spot images show the tip in the distal stomach and the side-port below GE junction. No manipulation was needed. IMPRESSION: Fluoroscopic confirmation nasogastric tube in the stomach. Electronically Signed   By: Donzetta Kohut M.D.   On: 04/26/2019 18:03   Ct Renal Stone Study  Result Date: 04/26/2019 CLINICAL DATA:  Flank pain UTI EXAM: CT ABDOMEN AND PELVIS WITHOUT CONTRAST TECHNIQUE: Multidetector CT imaging of the abdomen and pelvis was performed following the standard protocol without IV contrast. COMPARISON:  CT 08/15/2016 FINDINGS: Lower chest: Lung bases demonstrate no acute consolidation or effusion. The heart size is normal Hepatobiliary: No focal hepatic abnormality. Possible punctate stones within the gallbladder. No biliary dilatation. Pancreas: Unremarkable. No pancreatic ductal dilatation or surrounding inflammatory changes. Spleen: Normal in size without focal abnormality. Adrenals/Urinary Tract: Adrenal glands are unremarkable. Kidneys are normal, without renal  calculi, focal lesion, or hydronephrosis. Bladder is unremarkable. Stomach/Bowel: Moderate fluid-filled stomach. Fluid-filled borderline dilated mid small bowel within the anterior abdomen without well-defined transition point. Negative for bowel wall thickening. Appendix not well seen but no right lower quadrant inflammatory process. Vascular/Lymphatic: No significant vascular findings are present. No enlarged abdominal or pelvic lymph nodes. Reproductive: Status post hysterectomy. No adnexal masses. Other: Negative for free air or free fluid Musculoskeletal: Sacral stimulator on the left side. No  acute or suspicious osseous abnormality IMPRESSION: 1. Negative for hydronephrosis or ureteral stone. 2. Fluid-filled borderline distended mid small but without well-defined transition point. Findings could be secondary to mild ileus, though developing or partial obstruction is also considered. Electronically Signed   By: Jasmine Pang M.D.   On: 04/26/2019 00:02    Scheduled Meds:  busPIRone  7.5 mg Oral QHS   carbamazepine  100 mg Oral BID   enoxaparin (LOVENOX) injection  40 mg Subcutaneous Q24H   hydrOXYzine  12.5 mg Oral Daily   hydrOXYzine  25 mg Oral QHS   lurasidone  20 mg Oral Daily   Continuous Infusions:  sodium chloride 100 mL/hr at 04/27/19 0436   cefTRIAXone (ROCEPHIN)  IV 1 g (04/27/19 0857)    Principal Problem:   SBO (small bowel obstruction) (HCC) Active Problems:   Other bipolar disorder (HCC)   IBS   INTERSTITIAL CYSTITIS   UTI      Laverna Peace  [facility]

## 2019-04-27 NOTE — Progress Notes (Signed)
Patient ID: [name], female   DOB: Cimberly 19, 1982, 38 y.o.   MRN: [id number]       Subjective: Patient feels well this morning.  NGT came out overnight, but no further N/V.  Passing flatus.  Objective: Vital signs in last 24 hours: Temp:  [98.7 F (37.1 C)-99.1 F (37.3 C)] 98.7 F (37.1 C) (10/16 0452) Pulse Rate:  [75-106] 98 (10/16 0452) Resp:  [9-23] 18 (10/16 0452) BP: (111-138)/(72-98) 115/76 (10/16 0452) SpO2:  [95 %-100 %] 97 % (10/16 0452) Weight:  [71 kg] 71 kg (10/15 1756) Last BM Date: 04/26/19  Intake/Output from previous day: 10/15 0701 - 10/16 0700 In: 971.4 [I.V.:781.4; NG/GT:90; IV Piggyback:100] Out: 300 [Emesis/NG output:300] Intake/Output this shift: No intake/output data recorded.  PE: Heart: regular Lungs: CTAB Abd: soft, NT, ND, +BS  Lab Results:  Recent Labs    04/25/19 1738 04/27/19 0323  WBC 7.6 9.4  HGB 14.2 11.2*  HCT 44.8 33.2*  PLT 311 215   BMET Recent Labs    04/25/19 1738 04/27/19 0323  NA 139 138  K 4.0 3.6  CL 102 108  CO2 25 22  GLUCOSE 109* 97  BUN 21* 13  CREATININE 1.01* 0.61  CALCIUM 9.5 8.1*   PT/INR No results for input(s): LABPROT, INR in the last 72 hours. CMP     Component Value Date/Time   NA 138 04/27/2019 0323   K 3.6 04/27/2019 0323   CL 108 04/27/2019 0323   CO2 22 04/27/2019 0323   GLUCOSE 97 04/27/2019 0323   BUN 13 04/27/2019 0323   CREATININE 0.61 04/27/2019 0323   CALCIUM 8.1 (L) 04/27/2019 0323   PROT 7.9 04/25/2019 1738   ALBUMIN 4.8 04/25/2019 1738   AST 22 04/25/2019 1738   ALT 19 04/25/2019 1738   ALKPHOS 68 04/25/2019 1738   BILITOT 0.4 04/25/2019 1738   GFRNONAA >60 04/27/2019 0323   GFRAA >60 04/27/2019 0323   Lipase     Component Value Date/Time   LIPASE 37 04/25/2019 1738       Studies/Results: Dg Abdomen 1 View  Result Date: 04/26/2019 CLINICAL DATA:  Nasogastric tube repositioning EXAM: ABDOMEN - 1 VIEW COMPARISON:  Same-day radiograph FINDINGS: Nasogastric  tube is again seen coiled upon itself with the distal tip located within the distal esophagus. The bowel gas pattern is normal. No radio-opaque calculi or other significant radiographic abnormality are seen. IMPRESSION: 1. Nasogastric tube is coiled upon itself with the distal tip located within the distal esophagus. Recommend repositioning. 2. Unremarkable bowel gas pattern. Electronically Signed   By: Duanne GuessNicholas  Plundo M.D.   On: 04/26/2019 14:31   Dg Abdomen 1 View  Result Date: 04/26/2019 CLINICAL DATA:  NG tube placement. EXAM: ABDOMEN - 1 VIEW COMPARISON:  Earlier the same day. FINDINGS: The bowel gas pattern is normal. No radio-opaque calculi or other significant radiographic abnormality are seen. Enteric catheter is coiled back on itself with distal tip overlying the distal esophagus. IMPRESSION: 1. Enteric catheter is coiled back on itself with distal tip overlying the distal esophagus. 2. Nonobstructive bowel gas pattern. Electronically Signed   By: Ted Mcalpineobrinka  Dimitrova M.D.   On: 04/26/2019 13:56   Dg Abdomen 1 View  Result Date: 04/26/2019 CLINICAL DATA:  NG tube placement. EXAM: ABDOMEN - 1 VIEW COMPARISON:  CT 04/26/2019. FINDINGS: NG tube noted with its tip coiled back into the esophagus. Repositioning should be considered. No bowel distention. IMPRESSION: NG tube noted with its tip coiled back into the esophagus.  Repositioning should be considered. Electronically Signed   By: Maisie Fus  Register   On: 04/26/2019 13:06   Ct Abdomen Pelvis W Contrast  Result Date: 04/26/2019 CLINICAL DATA:  38 year old female on antibiotics for UTI without improvement. EXAM: CT ABDOMEN AND PELVIS WITH CONTRAST TECHNIQUE: Multidetector CT imaging of the abdomen and pelvis was performed using the standard protocol following bolus administration of intravenous contrast. CONTRAST:  OMNIPAQUE IOHEXOL 300 MG/ML  SOLN COMPARISON:  Abnormal noncontrast CT Abdomen and Pelvis earlier tonight, possible small bowel  obstruction. FINDINGS: Lower chest: Negative. No pericardial or pleural effusion. Hepatobiliary: Negative liver and gallbladder. Pancreas: Negative. Spleen: Negative. Adrenals/Urinary Tract: Normal adrenal glands. Bilateral renal enhancement is symmetric and within normal limits. No perinephric stranding. Proximal ureters appear decompressed. Diminutive and unremarkable urinary bladder. Left side pelvic phleboliths. Stomach/Bowel: Negative descending and rectosigmoid colon. Redundant but otherwise negative transverse colon. Mild retained stool. Negative right colon. The cecum is located in the right hemipelvis. Diminutive or absent appendix. Negative terminal ileum. Decompressed small bowel loops throughout the pelvis. Oral contrast was administered, distends the stomach and multiple dilated loops of small bowel. The duodenum and proximal jejunum remain normal. There is an abrupt transition point identified in the midline upper abdomen on coronal image 51 and series 2, image 33. This is very near the root of the small bowel mesentery. Distal to this the loops are decompressed. The upstream fluid-filled loops are dilated up to 27 millimeters diameter. No free air. No free fluid identified. Vascular/Lymphatic: Major arterial structures are patent with minimal atherosclerosis. Portal venous system is patent. No lymphadenopathy. Reproductive: Surgically absent uterus and diminutive or absent ovaries. Other: No pelvic free fluid. Musculoskeletal: No acute osseous abnormality identified. There is a left side S3 nerve level sacral stimulator device in place with a left posterior flank generator. IMPRESSION: 1. Positive for small bowel obstruction with an abrupt transition point in the midline upper abdomen very near the root of the small bowel mesentery. No free fluid or other complicating features. 2. No other acute or inflammatory process identified in the abdomen or pelvis. Electronically Signed   By: Odessa Fleming M.D.   On:  04/26/2019 03:40   Dg Abd Portable 1v-small Bowel Obstruction Protocol-initial, 8 Hr Delay  Result Date: 04/27/2019 CLINICAL DATA:  Small-bowel protocol EXAM: PORTABLE ABDOMEN - 1 VIEW COMPARISON:  Radiograph 04/26/2019 FINDINGS: Interval removal of the transesophageal tube seen on comparison. A nerve stimulator battery pack overlies the left hip with single lead overlying the left sacrum. Contrast material has traversed to the level of the rectum. Few nondistended loops of air filled small bowel remain in the mid abdomen. No suspicious calcifications. Phleboliths in the pelvis. No acute osseous or soft tissue abnormality. IMPRESSION: Contrast has traversed to the level of the rectum without residual air distended loops of small bowel to suggest high-grade obstruction. Electronically Signed   By: Kreg Shropshire M.D.   On: 04/27/2019 02:59   Dg Abd Portable 1v  Result Date: 04/26/2019 CLINICAL DATA:  Vomiting.  Nasogastric tube placement. EXAM: PORTABLE ABDOMEN - 1 VIEW COMPARISON:  Prior today FINDINGS: The nasogastric tube is now been advanced with the tip in the fundus of the stomach. No evidence of dilated bowel loops. IMPRESSION: Nasogastric tube tip now overlies the fundus of the stomach. Electronically Signed   By: Danae Orleans M.D.   On: 04/26/2019 20:07   Dg Georjean Mode Tube  Result Date: 04/26/2019 CLINICAL DATA:  Need for gastric tube. EXAM: FL FEEDING  TUBE PLACEMENT CONTRAST:  None FLUOROSCOPY TIME:  Fluoroscopy Time:  36 seconds Radiation Exposure Index (if provided by the fluoroscopic device): 3 mGy Number of Acquired Spot Images: 0 COMPARISON:  None. FINDINGS: The enteric tube is now in the stomach. Initial fluoroscopic, spot images show the tip in the distal stomach and the side-port below GE junction. No manipulation was needed. IMPRESSION: Fluoroscopic confirmation nasogastric tube in the stomach. Electronically Signed   By: Zetta Bills M.D.   On: 04/26/2019 18:03   Ct Renal  Stone Study  Result Date: 04/26/2019 CLINICAL DATA:  Flank pain UTI EXAM: CT ABDOMEN AND PELVIS WITHOUT CONTRAST TECHNIQUE: Multidetector CT imaging of the abdomen and pelvis was performed following the standard protocol without IV contrast. COMPARISON:  CT 08/15/2016 FINDINGS: Lower chest: Lung bases demonstrate no acute consolidation or effusion. The heart size is normal Hepatobiliary: No focal hepatic abnormality. Possible punctate stones within the gallbladder. No biliary dilatation. Pancreas: Unremarkable. No pancreatic ductal dilatation or surrounding inflammatory changes. Spleen: Normal in size without focal abnormality. Adrenals/Urinary Tract: Adrenal glands are unremarkable. Kidneys are normal, without renal calculi, focal lesion, or hydronephrosis. Bladder is unremarkable. Stomach/Bowel: Moderate fluid-filled stomach. Fluid-filled borderline dilated mid small bowel within the anterior abdomen without well-defined transition point. Negative for bowel wall thickening. Appendix not well seen but no right lower quadrant inflammatory process. Vascular/Lymphatic: No significant vascular findings are present. No enlarged abdominal or pelvic lymph nodes. Reproductive: Status post hysterectomy. No adnexal masses. Other: Negative for free air or free fluid Musculoskeletal: Sacral stimulator on the left side. No acute or suspicious osseous abnormality IMPRESSION: 1. Negative for hydronephrosis or ureteral stone. 2. Fluid-filled borderline distended mid small but without well-defined transition point. Findings could be secondary to mild ileus, though developing or partial obstruction is also considered. Electronically Signed   By: Donavan Foil M.D.   On: 04/26/2019 00:02    Anti-infectives: Anti-infectives (From admission, onward)   Start     Dose/Rate Route Frequency Ordered Stop   04/26/19 0930  cefTRIAXone (ROCEPHIN) 1 g in sodium chloride 0.9 % 100 mL IVPB     1 g 200 mL/hr over 30 Minutes Intravenous  Every 24 hours 04/26/19 0839         Assessment/Plan HTN Interstitial cystitis Bipolar disorder  Small bowel obstruction -resolving clinically and radiographically with contrast in her rectum -CLD today -mobilize -may resume oral home meds from our standpoint  FEN - CLD VTE - Lovenox ID - Rocephin, UTI   LOS: 1 day    Henreitta Cea , [facility] 04/27/2019, 8:40 AM Please see Amion for pager number during day hours 7:00am-4:30pm

## 2019-04-28 DIAGNOSIS — Z978 Presence of other specified devices: Secondary | ICD-10-CM

## 2019-04-28 LAB — CBC
HCT: 33.9 % — ABNORMAL LOW (ref 36.0–46.0)
Hemoglobin: 10.8 g/dL — ABNORMAL LOW (ref 12.0–15.0)
MCH: 29.8 pg (ref 26.0–34.0)
MCHC: 31.9 g/dL (ref 30.0–36.0)
MCV: 93.6 fL (ref 80.0–100.0)
Platelets: 223 10*3/uL (ref 150–400)
RBC: 3.62 MIL/uL — ABNORMAL LOW (ref 3.87–5.11)
RDW: 11.8 % (ref 11.5–15.5)
WBC: 6.1 10*3/uL (ref 4.0–10.5)
nRBC: 0 % (ref 0.0–0.2)

## 2019-04-28 NOTE — Plan of Care (Signed)
Reviewed discharge instructions with patient; copy given. IV removed. Patient ready for discharge.  

## 2019-04-28 NOTE — Plan of Care (Signed)
Patient sitting up in chair this morning; pain and nausea controlled. No needs expressed at this time. Will continue to monitor.

## 2019-04-28 NOTE — Discharge Summary (Signed)
[name] GNF:[id number] DOB: [birth year] DOA: 04/25/2019  PCP: [facility]  Admit date: 04/25/2019 Discharge date: 04/28/2019  Admitted From: Home Disposition: Home  Recommendations for Outpatient Follow-up:  1. Follow up with PCP in 1-2 weeks 2. Please obtain BMP/CBC in one week 3. Please follow up on the following pending results:  Home Health: No Equipment/Devices:None Discharge Condition: Stable CODE STATUS: Full Diet recommendation: Heart Healthy   Brief/Interim Summary: [name] a 38 y.o.femalewith medical history significant forrecurrent UTI, interstitial cystitis, anxiety/bipolar, GERD, IBD, presented to the ED on 10/14 complaining of abdominal pain located around her bilateral flank region as well as suprapubic area. Reports pain as intermittent, sharp, denies any radiation or aggravating alleviating factors.Patient has a history of recurrent UTI and follows up with her urologist,recently prescribed nitrofurantoin 10/7) with minimal improvement in her symptoms.  Culture was negative at that time, Bactrim was prescribed on 10/12 and patient had been taking on current presentation.   Despite taking the antibiotics, as well as over-the-counter pain meds, patient continues to have abdominal pain, reported some subjective fever/chills as well as dysuria. Patient denies any chest pain, shortness of breath, cough, nausea/vomiting, diarrhea. Patient reported last bowel movement was about 2days ago,unsure if she ispassing gas.  ED Course:Vital signs stable other than tachycardia with heart rate in the 120s.Labs unremarkable, UA showed positive nitrites, few bacteria, hematuria, mucus. UC pending.CT renal stone negative for hydronephrosis/ureteral stone,bladder unremarkable. CT abdomen positive for SBO, otherwise unremarkable. Multiple attempts to place NG tube. General surgery was consulted. [facility] asked to admit  patient   Hospital Course:    Small bowel obstruction secondary to adhesions, resolved.  Briefly required NG tube and bowel rest over 48 hours..  Was unable to transition to oral diet with no recurrent belly pain.  Repeat x-ray showed resolution of obstruction, clinically significantly improved.  Surgery followed and agree with assessment.  No new medications on discharge.  Advised to not take mother's pain medication in the future which she admitted to   Dysuria in patient with history of recurrent UTIs in setting of chronic interstitial cystitis and overactive bladder.  Previously on nitrofurantoin (10/7) with little improvement for UTI diagnosis outpatient and transitioned to Bactrim for 2 days prior to this hospitalization.  UA positive nitrates, negative leukocytes, 0-5 WBC.  Urine culture shows less than 10,000 colonies. Dc ceftriaxone this is likely related to interstitial cystitis.  She can resume her bactrim prescribed prior to hospitalization and follow up with her urologist accordingly   Bipolar disorder with anxiety, stable.  Continue home BuSpar, Tegretol, Atarax, Latuda   Consultations:  Surgery  Procedures/Studies: None Subjective: Passing flatus Having bowel movements No belly pain Tolerating regular diet Ready to go home  Discharge Exam: Vitals:   04/27/19 2113 04/28/19 0451  BP: 134/88 127/83  Pulse: 85 88  Resp: 18 20  Temp: 99.4 F (37.4 C) 99.4 F (37.4 C)  SpO2: 100% 100%   Vitals:   04/27/19 1400 04/27/19 2113 04/28/19 0451 04/28/19 0634  BP: 99/66 134/88 127/83   Pulse: 80 85 88   Resp: Temp: 98.6 F (37 C) 99.4 F (37.4 C) 99.4 F (37.4 C)   TempSrc: Oral Oral Oral   SpO2: 99% 100% 100%   Weight:    68 kg  Height:        Awake, alert, oriented x4 No respiratory effort on room air, clear breath sounds Regular rate and rhythm, no murmurs rubs  or gallops, no edema Abdomen soft, nondistended, nontender, no rebound tenderness  or guarding, normal bowel sounds No rash or bruising Normal affect, normal mood   Discharge Diagnoses:  Principal Problem:   SBO (small bowel obstruction) (HCC) Active Problems:   Other bipolar disorder (HCC)   IBS   INTERSTITIAL CYSTITIS   UTI    Discharge Instructions  Discharge Instructions    Diet - low sodium heart healthy   Complete by: As directed    Increase activity slowly   Complete by: As directed      Allergies as of 04/28/2019      Reactions   Gabapentin Other (See Comments)   Reaction:  Suicidal thoughts    Hydrocodone Shortness Of Breath   Methadone Other (See Comments)   Confusion/fainting   Augmentin [amoxicillin-pot Clavulanate] Nausea And Vomiting, Other (See Comments)   Has patient had a PCN reaction causing immediate rash, facial/tongue/throat swelling, SOB or lightheadedness with hypotension: No Has patient had a PCN reaction causing severe rash involving mucus membranes or skin necrosis: No Has patient had a PCN reaction that required hospitalization No Has patient had a PCN reaction occurring within the last 10 years: No If all of the above answers are "NO", then may proceed with Cephalosporin use.   Azithromycin Other (See Comments)   Pt states that this medication does not work for her.     Buprenorphine Itching   Ciprofloxacin Nausea Only   Erythromycin Nausea And Vomiting   Garlic Other (See Comments)   Reaction:  Bladder issues    Hydrocodone-acetaminophen Other (See Comments)   Pt states that this medication made her pass out.     Methenamine Other (See Comments)   Reaction:  Urinary retention    Morphine And Related Itching   Onion Nausea And Vomiting   Prozac [fluoxetine Hcl] Other (See Comments)   Reaction:  Suicidal thoughts    Pyridium [phenazopyridine Hcl] Other (See Comments)   Reaction:  Bladder pain    Uribel [meth-hyo-m Bl-na Phos-ph Sal] Other (See Comments)   Reaction:  Bladder issues       Medication List    STOP  taking these medications   fluticasone 50 MCG/ACT nasal spray Commonly known as: FLONASE     TAKE these medications   albuterol 108 (90 Base) MCG/ACT inhaler Commonly known as: VENTOLIN HFA Inhale 1 puff into the lungs every 4 (four) hours as needed for wheezing or shortness of breath.   busPIRone 15 MG tablet Commonly known as: BUSPAR Take 7.5 mg by mouth at bedtime.   carbamazepine 200 MG tablet Commonly known as: TEGRETOL Take 100 mg by mouth 2 (two) times daily.   estradiol 0.1 MG/24HR patch Commonly known as: VIVELLE-DOT Place 1 patch (0.1 mg total) onto the skin 2 (two) times a week.   Excedrin Migraine 250-250-65 MG tablet Generic drug: aspirin-acetaminophen-caffeine Take 2 tablets by mouth every 8 (eight) hours as needed for headache.   hydrOXYzine 25 MG tablet Commonly known as: ATARAX/VISTARIL Take 12.5-25 mg by mouth See admin instructions. 12.5 MG daily and 25 Mg at night   Latuda 20 MG Tabs tablet Generic drug: lurasidone Take 20 mg by mouth daily.   levocetirizine 5 MG tablet Commonly known as: XYZAL Take 5 mg by mouth at bedtime.   lisinopril 10 MG tablet Commonly known as: ZESTRIL Take 10 mg by mouth daily.   promethazine 50 MG tablet Commonly known as: PHENERGAN Take 50 mg by mouth every 6 (six) hours as needed  for nausea or vomiting.   rosuvastatin 20 MG tablet Commonly known as: CRESTOR Take 20 mg by mouth daily.   trimethoprim 100 MG tablet Commonly known as: TRIMPEX Take 100 mg by mouth 2 (two) times daily.   vitamin C 500 MG tablet Commonly known as: ASCORBIC ACID Take 500 mg by mouth daily.       Allergies  Allergen Reactions  . Gabapentin Other (See Comments)    Reaction:  Suicidal thoughts   . Hydrocodone Shortness Of Breath  . Methadone Other (See Comments)    Confusion/fainting  . Augmentin [Amoxicillin-Pot Clavulanate] Nausea And Vomiting and Other (See Comments)    Has patient had a PCN reaction causing immediate rash,  facial/tongue/throat swelling, SOB or lightheadedness with hypotension: No Has patient had a PCN reaction causing severe rash involving mucus membranes or skin necrosis: No Has patient had a PCN reaction that required hospitalization No Has patient had a PCN reaction occurring within the last 10 years: No If all of the above answers are "NO", then may proceed with Cephalosporin use.  . Azithromycin Other (See Comments)    Pt states that this medication does not work for her.    . Buprenorphine Itching  . Ciprofloxacin Nausea Only  . Erythromycin Nausea And Vomiting  . Garlic Other (See Comments)    Reaction:  Bladder issues   . Hydrocodone-Acetaminophen Other (See Comments)    Pt states that this medication made her pass out.    . Methenamine Other (See Comments)    Reaction:  Urinary retention   . Morphine And Related Itching  . Onion Nausea And Vomiting  . Prozac [Fluoxetine Hcl] Other (See Comments)    Reaction:  Suicidal thoughts   . Pyridium [Phenazopyridine Hcl] Other (See Comments)    Reaction:  Bladder pain   . Uribel [Meth-Hyo-M Bl-Na Phos-Ph Sal] Other (See Comments)    Reaction:  Bladder issues         The results of significant diagnostics from this hospitalization (including imaging, microbiology, ancillary and laboratory) are listed below for reference.     Microbiology: Recent Results (from the past 240 hour(s))  Urine culture     Status: Abnormal   Collection Time: 04/25/19  6:47 PM   Specimen: Urine, Clean Catch  Result Value Ref Range Status   Specimen Description   Final    URINE, CLEAN CATCH Performed at [facility], [place] [address] [place], [place] [address]    Special Requests   Final    NONE Performed at [facility], [place] [address], [place], [place] [address]    Culture (A)  Final    <10,000 COLONIES/mL INSIGNIFICANT GROWTH Performed at [place] [address] [place], [place] [address]     Report Status 04/26/2019 FINAL  Final  SARS Coronavirus 2 by RT PCR (hospital order, performed in [facility] hospital lab) Nasopharyngeal Nasopharyngeal Swab     Status: None   Collection Time: 04/26/19  4:48 AM   Specimen: Nasopharyngeal Swab  Result Value Ref Range Status   SARS Coronavirus 2 NEGATIVE NEGATIVE Final    Comment: (NOTE) If result is NEGATIVE SARS-CoV-2 target nucleic acids are NOT DETECTED. The SARS-CoV-2 RNA is generally detectable in upper and lower  respiratory specimens during the acute phase of infection. The lowest  concentration of SARS-CoV-2 viral copies this assay can detect is 250  copies / mL. A negative result does not preclude SARS-CoV-2 infection  and should not  be used as the sole basis for treatment or other  patient management decisions.  A negative result may occur with  improper specimen collection / handling, submission of specimen other  than nasopharyngeal swab, presence of viral mutation(s) within the  areas targeted by this assay, and inadequate number of viral copies  (<250 copies / mL). A negative result must be combined with clinical  observations, patient history, and epidemiological information. If result is POSITIVE SARS-CoV-2 target nucleic acids are DETECTED. The SARS-CoV-2 RNA is generally detectable in upper and lower  respiratory specimens dur ing the acute phase of infection.  Positive  results are indicative of active infection with SARS-CoV-2.  Clinical  correlation with patient history and other diagnostic information is  necessary to determine patient infection status.  Positive results do  not rule out bacterial infection or co-infection with other viruses. If result is PRESUMPTIVE POSTIVE SARS-CoV-2 nucleic acids MAY BE PRESENT.   A presumptive positive result was obtained on the submitted specimen  and confirmed on repeat testing.  While 2019 novel coronavirus  (SARS-CoV-2) nucleic acids may be present in the submitted  sample  additional confirmatory testing may be necessary for epidemiological  and / or clinical management purposes  to differentiate between  SARS-CoV-2 and other Sarbecovirus currently known to infect humans.  If clinically indicated additional testing with an alternate test  methodology [phone]) is advised. The SARS-CoV-2 RNA is generally  detectable in upper and lower respiratory sp ecimens during the acute  phase of infection. The expected result is Negative. Fact Sheet for Patients:  BoilerBrush.com.cy Fact Sheet for Healthcare Providers: https://pope.com/ This test is not yet approved or cleared by the [place] FDA and has been authorized for detection and/or diagnosis of SARS-CoV-2 by FDA under an Emergency Use Authorization (EUA).  This EUA will remain in effect (meaning this test can be used) for the duration of the COVID-19 declaration under Section 564(b)(1) of the Act, 21 U.S.C. section 360bbb-3(b)(1), unless the authorization is terminated or revoked sooner. Performed at [facility], [address] [place], [place] [address]      Labs: BNP (last 3 results) No results for input(s): BNP in the last 8760 hours. Basic Metabolic Panel: Recent Labs  Lab 04/25/19 1738 04/27/19 0323  NA 139 138  K 4.0 3.6  CL 102 108  CO2 25 22  GLUCOSE 109* 97  BUN 21* 13  CREATININE 1.01* 0.61  CALCIUM 9.5 8.1*   Liver Function Tests: Recent Labs  Lab 04/25/19 1738  AST 22  ALT 19  ALKPHOS 68  BILITOT 0.4  PROT 7.9  ALBUMIN 4.8   Recent Labs  Lab 04/25/19 1738  LIPASE 37   No results for input(s): AMMONIA in the last 168 hours. CBC: Recent Labs  Lab 04/25/19 1738 04/27/19 0323 04/28/19 0335  WBC 7.6 9.4 6.1  HGB 14.2 11.2* 10.8*  HCT 44.8 33.2* 33.9*  MCV 94.3 91.7 93.6  PLT 311 215 223   Cardiac Enzymes: No results for input(s): CKTOTAL, CKMB, CKMBINDEX, TROPONINI in the last 168  hours. BNP: Invalid input(s): POCBNP CBG: No results for input(s): GLUCAP in the last 168 hours. D-Dimer No results for input(s): DDIMER in the last 72 hours. Hgb A1c No results for input(s): HGBA1C in the last 72 hours. Lipid Profile No results for input(s): CHOL, HDL, LDLCALC, TRIG, CHOLHDL, LDLDIRECT in the last 72 hours. Thyroid function studies No results for input(s): TSH, T4TOTAL, T3FREE, THYROIDAB in the last 72 hours.  Invalid input(s): FREET3 Anemia work up No results for input(s): VITAMINB12, FOLATE, FERRITIN, TIBC, IRON, RETICCTPCT in the last 72 hours. Urinalysis    Component Value Date/Time   COLORURINE AMBER (A) 04/25/2019 1733   APPEARANCEUR HAZY (A) 04/25/2019 1733   LABSPEC 1.027 04/25/2019 1733   PHURINE 5.0 04/25/2019 1733   GLUCOSEU NEGATIVE 04/25/2019 1733   HGBUR NEGATIVE 04/25/2019 1733   HGBUR negative 04/01/2008 1448   BILIRUBINUR NEGATIVE 04/25/2019 1733   KETONESUR NEGATIVE 04/25/2019 1733   PROTEINUR 30 (A) 04/25/2019 1733   UROBILINOGEN 1.0 08/15/2018 1946   NITRITE POSITIVE (A) 04/25/2019 1733   LEUKOCYTESUR NEGATIVE 04/25/2019 1733   Sepsis Labs Invalid input(s): PROCALCITONIN,  WBC,  LACTICIDVEN Microbiology Recent Results (from the past 240 hour(s))  Urine culture     Status: Abnormal   Collection Time: 04/25/19  6:47 PM   Specimen: Urine, Clean Catch  Result Value Ref Range Status   Specimen Description   Final    URINE, CLEAN CATCH Performed at [facility], [address], [place], [place] [address]    Special Requests   Final    NONE Performed at [facility], [address], [place], [place] [address]    Culture (A)  Final    <10,000 COLONIES/mL INSIGNIFICANT GROWTH Performed at [facility], [address], [place], [place] [address]    Report Status 04/26/2019 FINAL  Final  SARS Coronavirus 2 by RT PCR (hospital order, performed in [facility] hospital lab) Nasopharyngeal  Nasopharyngeal Swab     Status: None   Collection Time: 04/26/19  4:48 AM   Specimen: Nasopharyngeal Swab  Result Value Ref Range Status   SARS Coronavirus 2 NEGATIVE NEGATIVE Final    Comment: (NOTE) If result is NEGATIVE SARS-CoV-2 target nucleic acids are NOT DETECTED. The SARS-CoV-2 RNA is generally detectable in upper and lower  respiratory specimens during the acute phase of infection. The lowest  concentration of SARS-CoV-2 viral copies this assay can detect is 250  copies / mL. A negative result does not preclude SARS-CoV-2 infection  and should not be used as the sole basis for treatment or other  patient management decisions.  A negative result may occur with  improper specimen collection / handling, submission of specimen other  than nasopharyngeal swab, presence of viral mutation(s) within the  areas targeted by this assay, and inadequate number of viral copies  (<250 copies / mL). A negative result must be combined with clinical  observations, patient history, and epidemiological information. If result is POSITIVE SARS-CoV-2 target nucleic acids are DETECTED. The SARS-CoV-2 RNA is generally detectable in upper and lower  respiratory specimens dur ing the acute phase of infection.  Positive  results are indicative of active infection with SARS-CoV-2.  Clinical  correlation with patient history and other diagnostic information is  necessary to determine patient infection status.  Positive results do  not rule out bacterial infection or co-infection with other viruses. If result is PRESUMPTIVE POSTIVE SARS-CoV-2 nucleic acids MAY BE PRESENT.   A presumptive positive result was obtained on the submitted specimen  and confirmed on repeat testing.  While 2019 novel coronavirus  (SARS-CoV-2) nucleic acids may be present in the submitted sample  additional confirmatory testing may be necessary for epidemiological  and / or clinical management purposes  to differentiate between   SARS-CoV-2 and other Sarbecovirus currently known to infect humans.  If clinically indicated additional testing with an alternate test  methodology [phone]) is  advised. The SARS-CoV-2 RNA is generally  detectable in upper and lower respiratory sp ecimens during the acute  phase of infection. The expected result is Negative. Fact Sheet for Patients:  BoilerBrush.com.cy Fact Sheet for Healthcare Providers: https://pope.com/ This test is not yet approved or cleared by the [place] FDA and has been authorized for detection and/or diagnosis of SARS-CoV-2 by FDA under an Emergency Use Authorization (EUA).  This EUA will remain in effect (meaning this test can be used) for the duration of the COVID-19 declaration under Section 564(b)(1) of the Act, 21 U.S.C. section 360bbb-3(b)(1), unless the authorization is terminated or revoked sooner. Performed at [facility], [address] [place], [place] [address]      Time coordinating discharge: Over 30 minutes  SIGNED:   Laverna Peace, MD  [facility] 04/28/2019, 11:05 AM Pager   If 7PM-7AM, please contact night-coverage [email or web address] Password [email or web address]

## 2019-04-28 NOTE — Progress Notes (Signed)
Assessment & Plan: Small bowel obstruction -resolved clinically and radiographically -regular diet -suspect home later today per medical service -surgical follow up prn - will sign off  VTE -Lovenox ID -Rocephin, UTI  HTN Interstitial cystitis Bipolar disorder       Armandina Gemma, MD       [facility]       Office: [phone]   Chief Complaint: Small bowel obstruction  Subjective: Patient up in room, chipper, no complaints.  Four formed BM's overnight.  Tolerating diet.  Objective: Vital signs in last 24 hours: Temp:  [98.6 F (37 C)-99.4 F (37.4 C)] 99.4 F (37.4 C) (10/17 0451) Pulse Rate:  [80-88] 88 (10/17 0451) Resp:  [18-20] 20 (10/17 0451) BP: (99-134)/(66-88) 127/83 (10/17 0451) SpO2:  [99 %-100 %] 100 % (10/17 0451) Weight:  [68 kg] 68 kg (10/17 0634) Last BM Date: 04/27/19  Intake/Output from previous day: 10/16 0701 - 10/17 0700 In: 2850.6 [P.O.:960; I.V.:1690.6; IV Piggyback:200] Out: 2400 [Urine:2400] Intake/Output this shift: No intake/output data recorded.  Physical Exam: HEENT - sclerae clear, mucous membranes moist Neck - soft Chest - clear bilaterally Cor - RRR Abdomen - soft without distension; non-tender Ext - no edema, non-tender Neuro - alert & oriented, no focal deficits  Lab Results:  Recent Labs    04/27/19 0323 04/28/19 0335  WBC 9.4 6.1  HGB 11.2* 10.8*  HCT 33.2* 33.9*  PLT 215 223   BMET Recent Labs    04/25/19 1738 04/27/19 0323  NA 139 138  K 4.0 3.6  CL 102 108  CO2 25 22  GLUCOSE 109* 97  BUN 21* 13  CREATININE 1.01* 0.61  CALCIUM 9.5 8.1*   PT/INR No results for input(s): LABPROT, INR in the last 72 hours. Comprehensive Metabolic Panel:    Component Value Date/Time   NA 138 04/27/2019 0323   NA 139 04/25/2019 1738   K 3.6 04/27/2019 0323   K 4.0 04/25/2019 1738   CL 108 04/27/2019 0323   CL 102 04/25/2019 1738   CO2 22 04/27/2019 0323   CO2 25 04/25/2019 1738   BUN 13 04/27/2019 0323   BUN 21 (H) 04/25/2019 1738   CREATININE 0.61 04/27/2019 0323   CREATININE 1.01 (H) 04/25/2019 1738   GLUCOSE 97 04/27/2019 0323   GLUCOSE 109 (H) 04/25/2019 1738   CALCIUM 8.1 (L) 04/27/2019 0323   CALCIUM 9.5 04/25/2019 1738   AST 22 04/25/2019 1738   AST 23 04/04/2019 1700   ALT 19 04/25/2019 1738   ALT 19 04/04/2019 1700   ALKPHOS 68 04/25/2019 1738   ALKPHOS 68 04/04/2019 1700   BILITOT 0.4 04/25/2019 1738   BILITOT 0.5 04/04/2019 1700   PROT 7.9 04/25/2019 1738   PROT 7.1 04/04/2019 1700   ALBUMIN 4.8 04/25/2019 1738   ALBUMIN 4.5 04/04/2019 1700    Studies/Results: Dg Abdomen 1 View  Result Date: 04/26/2019 CLINICAL DATA:  Nasogastric tube repositioning EXAM: ABDOMEN - 1 VIEW COMPARISON:  Same-day radiograph FINDINGS: Nasogastric tube is again seen coiled upon itself with the distal tip located within the distal esophagus. The bowel gas pattern is normal. No radio-opaque calculi or other significant radiographic abnormality are seen. IMPRESSION: 1. Nasogastric tube is coiled upon itself with the distal tip located within the distal esophagus. Recommend repositioning. 2. Unremarkable bowel gas pattern. Electronically Signed   By: Davina Poke M.D.   On: 04/26/2019 14:31   Dg Abdomen 1 View  Result Date: 04/26/2019 CLINICAL DATA:  NG tube placement. EXAM: ABDOMEN - 1 VIEW COMPARISON:  Earlier the same day. FINDINGS: The bowel gas pattern is normal. No radio-opaque calculi or other significant radiographic abnormality are seen. Enteric catheter is coiled back on itself with distal tip overlying the distal esophagus. IMPRESSION: 1. Enteric catheter is coiled back on itself with distal tip overlying the distal esophagus. 2. Nonobstructive bowel gas pattern. Electronically Signed   By: Ted Mcalpine M.D.   On: 04/26/2019 13:56   Dg Abdomen 1 View  Result Date: 04/26/2019 CLINICAL DATA:  NG tube placement. EXAM: ABDOMEN - 1 VIEW COMPARISON:  CT  04/26/2019. FINDINGS: NG tube noted with its tip coiled back into the esophagus. Repositioning should be considered. No bowel distention. IMPRESSION: NG tube noted with its tip coiled back into the esophagus. Repositioning should be considered. Electronically Signed   By: Maisie Fus  Register   On: 04/26/2019 13:06   Dg Abd Portable 1v-small Bowel Obstruction Protocol-initial, 8 Hr Delay  Result Date: 04/27/2019 CLINICAL DATA:  Small-bowel protocol EXAM: PORTABLE ABDOMEN - 1 VIEW COMPARISON:  Radiograph 04/26/2019 FINDINGS: Interval removal of the transesophageal tube seen on comparison. A nerve stimulator battery pack overlies the left hip with single lead overlying the left sacrum. Contrast material has traversed to the level of the rectum. Few nondistended loops of air filled small bowel remain in the mid abdomen. No suspicious calcifications. Phleboliths in the pelvis. No acute osseous or soft tissue abnormality. IMPRESSION: Contrast has traversed to the level of the rectum without residual air distended loops of small bowel to suggest high-grade obstruction. Electronically Signed   By: Kreg Shropshire M.D.   On: 04/27/2019 02:59   Dg Abd Portable 1v  Result Date: 04/26/2019 CLINICAL DATA:  Vomiting.  Nasogastric tube placement. EXAM: PORTABLE ABDOMEN - 1 VIEW COMPARISON:  Prior today FINDINGS: The nasogastric tube is now been advanced with the tip in the fundus of the stomach. No evidence of dilated bowel loops. IMPRESSION: Nasogastric tube tip now overlies the fundus of the stomach. Electronically Signed   By: Danae Orleans M.D.   On: 04/26/2019 20:07   Dg Georjean Mode Tube  Result Date: 04/26/2019 CLINICAL DATA:  Need for gastric tube. EXAM: FL FEEDING TUBE PLACEMENT CONTRAST:  None FLUOROSCOPY TIME:  Fluoroscopy Time:  36 seconds Radiation Exposure Index (if provided by the fluoroscopic device): 3 mGy Number of Acquired Spot Images: 0 COMPARISON:  None. FINDINGS: The enteric tube is now in the  stomach. Initial fluoroscopic, spot images show the tip in the distal stomach and the side-port below GE junction. No manipulation was needed. IMPRESSION: Fluoroscopic confirmation nasogastric tube in the stomach. Electronically Signed   By: Donzetta Kohut M.D.   On: 04/26/2019 18:03      Darnell Level 04/28/2019  Patient ID: [name], female   DOB: 06/01/1981, 38 y.o.   MRN: [id number]

## 2019-05-10 ENCOUNTER — Ambulatory Visit: Payer: Medicare Other | Admitting: Gynecology

## 2019-06-14 ENCOUNTER — Encounter: Payer: Self-pay | Admitting: Gynecology

## 2019-06-14 ENCOUNTER — Ambulatory Visit (INDEPENDENT_AMBULATORY_CARE_PROVIDER_SITE_OTHER): Payer: Medicare Other | Admitting: Gynecology

## 2019-06-14 ENCOUNTER — Other Ambulatory Visit: Payer: Self-pay

## 2019-06-14 VITALS — BP 124/80

## 2019-06-14 DIAGNOSIS — N898 Other specified noninflammatory disorders of vagina: Secondary | ICD-10-CM | POA: Diagnosis not present

## 2019-06-14 LAB — WET PREP FOR TRICH, YEAST, CLUE

## 2019-06-14 MED ORDER — FLUCONAZOLE 150 MG PO TABS: 150.0000 mg | ORAL_TABLET | Freq: Once | ORAL | 0 refills | Status: AC

## 2019-06-14 NOTE — Patient Instructions (Signed)
Take the 1 Diflucan pill to treat the yeast infection.  Follow-up if your symptoms persist.

## 2019-06-14 NOTE — Progress Notes (Signed)
    [name] 17-Aug-1980 [id number]        38 y.o.  G0P0 presents with a history of recent admission in October for bowel obstruction.  Was subsequently treated for UTI with antibiotics.  Now has vaginal discharge with itching.  Also was having a lot of hot flushes and sweats.  Is on Vivelle 0.1 mg patches with history of hysterectomy with BSO for endometriosis.  Notes that her hot flashes seem to be getting better now.  No urinary symptoms such as frequency dysuria urgency.  Past medical history,surgical history, problem list, medications, allergies, family history and social history were all reviewed and documented in the EPIC chart.  Directed ROS with pertinent positives and negatives documented in the history of present illness/assessment and plan.  Exam: Caryn Bee assistant Vitals:   06/14/19 0823  BP: 124/80   General appearance:  Normal Abdomen soft nontender without mass guarding rebound Pelvic external BUS vagina with slight white discharge.  Bimanual without masses or tenderness.  Assessment/Plan:  38 y.o. G0P0 with history and exam as above.  Wet prep was negative.  Given the history and recent antibiotics I suspect she has a yeast infection.  Will cover with Diflucan 150 mg x 1 dose.  She will follow-up if she continues to have symptoms.  As her hot flushes and sweats are now resolving she will continue on her 0.1 mg patch.  If she would continue to have any issues she will read present for evaluation.    Anastasio Auerbach MD, 8:33 AM 06/14/2019

## 2019-07-25 ENCOUNTER — Other Ambulatory Visit: Payer: Self-pay

## 2019-07-26 ENCOUNTER — Other Ambulatory Visit: Payer: Medicare Other

## 2019-07-26 DIAGNOSIS — R7989 Other specified abnormal findings of blood chemistry: Secondary | ICD-10-CM

## 2019-07-27 LAB — TSH: TSH: 2.22 mIU/L

## 2019-08-10 ENCOUNTER — Other Ambulatory Visit: Payer: Self-pay

## 2019-08-13 ENCOUNTER — Encounter: Payer: Self-pay | Admitting: Obstetrics and Gynecology

## 2019-08-13 ENCOUNTER — Ambulatory Visit (INDEPENDENT_AMBULATORY_CARE_PROVIDER_SITE_OTHER): Payer: Medicare Other | Admitting: Obstetrics and Gynecology

## 2019-08-13 ENCOUNTER — Other Ambulatory Visit: Payer: Self-pay

## 2019-08-13 VITALS — BP 124/78

## 2019-08-13 DIAGNOSIS — N898 Other specified noninflammatory disorders of vagina: Secondary | ICD-10-CM

## 2019-08-13 DIAGNOSIS — Z114 Encounter for screening for human immunodeficiency virus [HIV]: Secondary | ICD-10-CM | POA: Diagnosis not present

## 2019-08-13 DIAGNOSIS — R300 Dysuria: Secondary | ICD-10-CM

## 2019-08-13 DIAGNOSIS — Z113 Encounter for screening for infections with a predominantly sexual mode of transmission: Secondary | ICD-10-CM

## 2019-08-13 LAB — WET PREP FOR TRICH, YEAST, CLUE

## 2019-08-13 NOTE — Patient Instructions (Signed)
Try Replens vaginal moisturizer at least a few times per week We will contact you with the results of the testing for gonorrhea and chlamydia The blood work for STD screening is ordered so you may complete at your convenience

## 2019-08-13 NOTE — Progress Notes (Signed)
   [name]  Mar 07, 1981 [id number]  HPI The patient is a 39 y.o. G0P0 with history of interstitial cystitis who presents today for vaginal discharge, burning, and itching and STI screening.   She has a history of hysterectomy with BSO for endometriosis.  Off of the patch for the last few weeks, doing okay with the hot flashes, because she feels her bladder does better off of it.  She admits that she has different partners and is not in a monogamous relationship currently.  She is uncertain if she has dysuria.  She does not have a strong history of STIs in the past as she reports.  Past medical history,surgical history, problem list, medications, allergies, family history and social history were all reviewed and documented as reviewed in the EPIC chart.  ROS:  Feeling well. No dyspnea or chest pain on exertion.  No abdominal pain, change in bowel habits, black or bloody stools.  Possible urinary tract symptoms. GYN ROS: no abnormal bleeding  Physical Exam BP 124/78   LMP 09/27/2013   General: Pleasant female, no acute distress, alert and oriented PELVIC EXAM: VULVA: normal appearing vulva with no masses, tenderness or lesions, mild atrophic changes, VAGINA: normal appearing vagina with normal color and scant leukorrheic discharge, no lesions, CERVIX: surgically absent, UTERUS: surgically absent, vaginal cuff well healed, ADNEXA: normal adnexa in size, nontender and no masses, no masses  [name] present during the exam.  Wet mount negative for yeast, trichomonas, clue cells, few WBC, moderate bacteria, epithelial cells less than 6 hpf Urinalysis with 0-5 WBC, 10-20 RBC, 6-10 squamous epithelial cells, few bacteria, negative leukocyte esterase, negative nitrate, negative ketones.  Assessment 39 year old G0 P0 here for STI screening  Plan [name] had some questions regarding anatomy of the vaginal area following a total hysterectomy which we reviewed.  She understands that the vagina is  essentially a blind pouch after a total hysterectomy without any real possibility of a sending infections in the pelvic area beyond the vagina.  STIs are still possible.  We did testing today and GC/chlamydia testing is pending, and we ordered blood test screening which she can get completed at her convenience.  It sounds that she has endometriosis that affects her bladder in addition to a diagnosis of interstitial cystitis, and she is managed by a urologist Dr. Sabino Anderson for the IC.  We will follow up STI screening results and let her know.  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  Another option would be to trial vaginal estrogen cream if she is interested.  All questions were answered by the end of the visit.   Lydia Foil MD 08/13/19

## 2019-08-14 LAB — GC PROBE AMP THINPREP: N. gonorrhoeae RNA, TMA: NOT DETECTED

## 2019-08-14 LAB — CHLAMYDIA PROBE AMP THINPREP: C. trachomatis RNA, TMA: NOT DETECTED

## 2019-08-15 LAB — URINE CULTURE
MICRO NUMBER:: 10101709
Result:: NO GROWTH
SPECIMEN QUALITY:: ADEQUATE

## 2019-08-15 LAB — URINALYSIS, COMPLETE W/RFL CULTURE
Bilirubin Urine: NEGATIVE
Glucose, UA: NEGATIVE
Hyaline Cast: NONE SEEN /LPF
Ketones, ur: NEGATIVE
Leukocyte Esterase: NEGATIVE
Nitrites, Initial: NEGATIVE
Protein, ur: NEGATIVE
Specific Gravity, Urine: 1.02 (ref 1.001–1.03)
pH: 6.5 (ref 5.0–8.0)

## 2019-08-15 LAB — CULTURE INDICATED

## 2019-08-20 ENCOUNTER — Telehealth: Payer: Self-pay | Admitting: *Deleted

## 2019-08-20 DIAGNOSIS — Z9229 Personal history of other drug therapy: Secondary | ICD-10-CM

## 2019-08-20 NOTE — Telephone Encounter (Signed)
Yes, that would be fine. Thanks

## 2019-08-20 NOTE — Telephone Encounter (Signed)
Patient is scheduled for lab appointment on 08/23/19 for STD's screening labs, she asked if [facility] level could be checked as well, has been off HRT patch for weeks now. Please advise

## 2019-08-20 NOTE — Telephone Encounter (Signed)
Order placed

## 2019-08-23 ENCOUNTER — Other Ambulatory Visit: Payer: Medicare Other

## 2019-08-29 ENCOUNTER — Telehealth: Payer: Self-pay

## 2019-08-29 NOTE — Telephone Encounter (Signed)
Patient called about having FSH level checked at time of Labwork next week. I told her I see where she called with that request on 08/20/19 and Dr. Penni Bombard said that would be fine and order has been placed.

## 2019-08-30 ENCOUNTER — Other Ambulatory Visit: Payer: Medicare Other

## 2019-09-06 ENCOUNTER — Other Ambulatory Visit: Payer: Medicare Other

## 2019-09-06 ENCOUNTER — Other Ambulatory Visit: Payer: Self-pay

## 2019-09-10 ENCOUNTER — Other Ambulatory Visit: Payer: Self-pay | Admitting: Gynecology

## 2019-09-10 DIAGNOSIS — Z113 Encounter for screening for infections with a predominantly sexual mode of transmission: Secondary | ICD-10-CM

## 2019-09-10 DIAGNOSIS — R7989 Other specified abnormal findings of blood chemistry: Secondary | ICD-10-CM

## 2019-09-10 DIAGNOSIS — R5382 Chronic fatigue, unspecified: Secondary | ICD-10-CM

## 2019-09-10 DIAGNOSIS — N951 Menopausal and female climacteric states: Secondary | ICD-10-CM

## 2019-09-10 DIAGNOSIS — Z114 Encounter for screening for human immunodeficiency virus [HIV]: Secondary | ICD-10-CM

## 2019-09-12 ENCOUNTER — Other Ambulatory Visit: Payer: Self-pay

## 2019-09-12 ENCOUNTER — Other Ambulatory Visit: Payer: Medicare Other

## 2019-09-12 DIAGNOSIS — N951 Menopausal and female climacteric states: Secondary | ICD-10-CM

## 2019-09-12 DIAGNOSIS — R5382 Chronic fatigue, unspecified: Secondary | ICD-10-CM

## 2019-09-12 DIAGNOSIS — Z113 Encounter for screening for infections with a predominantly sexual mode of transmission: Secondary | ICD-10-CM

## 2019-09-12 DIAGNOSIS — Z114 Encounter for screening for human immunodeficiency virus [HIV]: Secondary | ICD-10-CM

## 2019-09-13 ENCOUNTER — Other Ambulatory Visit: Payer: Medicare Other

## 2019-09-13 ENCOUNTER — Ambulatory Visit (INDEPENDENT_AMBULATORY_CARE_PROVIDER_SITE_OTHER): Payer: Medicare Other | Admitting: Obstetrics and Gynecology

## 2019-09-13 ENCOUNTER — Encounter: Payer: Self-pay | Admitting: Obstetrics and Gynecology

## 2019-09-13 VITALS — BP 116/76 | Ht 62.0 in | Wt 154.0 lb

## 2019-09-13 DIAGNOSIS — N951 Menopausal and female climacteric states: Secondary | ICD-10-CM

## 2019-09-13 DIAGNOSIS — Z9189 Other specified personal risk factors, not elsewhere classified: Secondary | ICD-10-CM

## 2019-09-13 DIAGNOSIS — Z01419 Encounter for gynecological examination (general) (routine) without abnormal findings: Secondary | ICD-10-CM

## 2019-09-13 DIAGNOSIS — Z113 Encounter for screening for infections with a predominantly sexual mode of transmission: Secondary | ICD-10-CM

## 2019-09-13 LAB — HIV ANTIBODY (ROUTINE TESTING W REFLEX): HIV 1&2 Ab, 4th Generation: NONREACTIVE

## 2019-09-13 LAB — TSH: TSH: 3.03 mIU/L

## 2019-09-13 LAB — FOLLICLE STIMULATING HORMONE: FSH: 90.9 m[IU]/mL

## 2019-09-13 LAB — RPR: RPR Ser Ql: NONREACTIVE

## 2019-09-13 LAB — HEPATITIS B SURFACE ANTIGEN: Hepatitis B Surface Ag: NONREACTIVE

## 2019-09-13 NOTE — Progress Notes (Signed)
[name] 02/17/1981 [id number]  SUBJECTIVE:  39 y.o. G0P0 female for annual routine gynecologic exam. She has no gynecologic concerns.  Current Outpatient Medications  Medication Sig Dispense Refill  . aspirin-acetaminophen-caffeine (EXCEDRIN MIGRAINE) 250-250-65 MG tablet Take 2 tablets by mouth every 8 (eight) hours as needed for headache.     . busPIRone (BUSPAR) 15 MG tablet Take 7.5 mg by mouth at bedtime.    . carbamazepine (TEGRETOL) 200 MG tablet Take 100 mg by mouth 2 (two) times daily.    . hydrOXYzine (ATARAX/VISTARIL) 25 MG tablet Take 12.5-25 mg by mouth See admin instructions. 12.5 MG daily and 25 Mg at night    . levocetirizine (XYZAL) 5 MG tablet Take 5 mg by mouth at bedtime.     Marland Kitchen lisinopril (PRINIVIL,ZESTRIL) 10 MG tablet Take 10 mg by mouth daily.  2  . lurasidone (LATUDA) 20 MG TABS tablet Take 20 mg by mouth daily.    . promethazine (PHENERGAN) 50 MG tablet Take 50 mg by mouth every 6 (six) hours as needed for nausea or vomiting.     . rosuvastatin (CRESTOR) 20 MG tablet Take 20 mg by mouth daily.  2  . albuterol (PROVENTIL HFA;VENTOLIN HFA) 108 (90 Base) MCG/ACT inhaler Inhale 1 puff into the lungs every 4 (four) hours as needed for wheezing or shortness of breath.     . estradiol (VIVELLE-DOT) 0.1 MG/24HR patch Place 1 patch (0.1 mg total) onto the skin 2 (two) times a week. (Patient not taking: Reported on 08/13/2019) 24 patch 3   No current facility-administered medications for this visit.   Allergies: Gabapentin, Hydrocodone, Methadone, Augmentin [amoxicillin-pot clavulanate], Azithromycin, Buprenorphine, Ciprofloxacin, Erythromycin, Garlic, Hydrocodone-acetaminophen, Methenamine, Morphine and related, Onion, Prozac [fluoxetine hcl], Pyridium [phenazopyridine hcl], and Uribel [meth-hyo-m bl-na phos-ph sal]  Patient's last menstrual period was 09/27/2013.  Past medical history,surgical history, problem list, medications, allergies, family history and social  history were all reviewed and documented as reviewed in the EPIC chart.  ROS:  Feeling well. No dyspnea or chest pain on exertion.  No abdominal pain, change in bowel habits, black or bloody stools.  No urinary tract symptoms. GYN ROS: no abnormal bleeding, pelvic pain or discharge, no breast pain or new or enlarging lumps on self exam. No neurological complaints.    OBJECTIVE:  BP 116/76   Ht 5\' 2"  (1.575 m)   Wt 154 lb (69.9 kg)   LMP 09/27/2013   BMI 28.17 kg/m  The patient appears well, alert, oriented x 3, in no distress. ENT normal.  Neck supple. No cervical or supraclavicular adenopathy or thyromegaly.  Lungs are clear, good air entry, no wheezes, rhonchi or rales. S1 and S2 normal, no murmurs, regular rate and rhythm.  Abdomen soft without tenderness, guarding, mass or organomegaly.  Neurological is normal, no focal findings.  BREAST EXAM: breasts appear normal, no suspicious masses, no skin or nipple changes or axillary nodes  PELVIC EXAM: VULVA: normal appearing vulva with no masses, tenderness or lesions, VAGINA: normal appearing vagina with normal color and discharge, no lesions, CERVIX: surgically absent, UTERUS: surgically absent, vaginal cuff well healed, ADNEXA: no masses  Chaperone: 09/29/2013 present during the examination  ASSESSMENT:  39 y.o. G0P0 here for annual gynecologic exam  PLAN:   1.  Postmenopausal/surgical menopause.  FSH level of 90.9 puts her well into the menopausal range.  Previous TAH RSO, subsequent LSO with a history of endometriosis by history (not pathology proven).  Minor hot flashes, she is doing okay  off of estrogen, she feels estrogen supplement etc. bladder condition/pain/frequency worse, so she does better off of it, so we discussed that she may have endometriosis within her bladder.  She declines any need for treatment right now.  Otherwise no vaginal bleeding or other concerns.  2.  STI screening from her previous encounter was negative.   Results were reviewed with her today. 3. Pap smear 08/2018.   With previous hysterectomy if there is no prior history of abnormal Pap smears, she could consider stopping screening but we will readdress this at her future visits. 4. Mammogram recommended starting at age 55.  The patient says during the exam today that she "does not want to do breast exams."  We initially took this to mean that she does not want to do a clinical breast exam.  She also further expanded indicating that she does not want to do mammograms because her mother had a bad experience with the imagin procedure at one point.  After completion of the exam and the patient getting dressed and leaving the room, she asked staff if we were going to do a breast exam today.  We discussed that there was a misunderstanding in communication and then went ahead and did the breast exam and it was normal.  Mammogram is recommended but it ultimately is up to her discretion.  Early detection of breast cancer is best achieved with a combination of clinical and mammogram exams. 5. DEXA will be recommended when a bit further in the menopause.   6.  Interstitial cystitis.  She does follow with urology for this condition.  7. Health maintenance.  No labs today as she has no new symptoms to warrant screening at this time.  We will reevaluate next year to see if she would like to do routine screening labs.  Return annually or sooner, prn.  Joseph Pierini MD  09/13/19

## 2019-11-06 ENCOUNTER — Emergency Department (HOSPITAL_COMMUNITY): Payer: Medicare Other

## 2019-11-06 ENCOUNTER — Other Ambulatory Visit: Payer: Self-pay

## 2019-11-06 ENCOUNTER — Emergency Department (HOSPITAL_COMMUNITY)
Admission: EM | Admit: 2019-11-06 | Discharge: 2019-11-06 | Disposition: A | Discharge status: Home / Self Care | Payer: Medicare Other | Attending: Emergency Medicine | Admitting: Emergency Medicine

## 2019-11-06 DIAGNOSIS — R1084 Generalized abdominal pain: Secondary | ICD-10-CM

## 2019-11-06 DIAGNOSIS — R112 Nausea with vomiting, unspecified: Secondary | ICD-10-CM | POA: Diagnosis not present

## 2019-11-06 DIAGNOSIS — Z87891 Personal history of nicotine dependence: Secondary | ICD-10-CM | POA: Diagnosis not present

## 2019-11-06 DIAGNOSIS — Z79899 Other long term (current) drug therapy: Secondary | ICD-10-CM | POA: Diagnosis not present

## 2019-11-06 DIAGNOSIS — I10 Essential (primary) hypertension: Secondary | ICD-10-CM | POA: Diagnosis not present

## 2019-11-06 DIAGNOSIS — Z975 Presence of (intrauterine) contraceptive device: Secondary | ICD-10-CM | POA: Diagnosis not present

## 2019-11-06 LAB — URINALYSIS, ROUTINE W REFLEX MICROSCOPIC
Bilirubin Urine: NEGATIVE
Glucose, UA: NEGATIVE mg/dL
Ketones, ur: NEGATIVE mg/dL
Nitrite: NEGATIVE
Protein, ur: NEGATIVE mg/dL
Specific Gravity, Urine: 1.011 (ref 1.005–1.030)
pH: 6 (ref 5.0–8.0)

## 2019-11-06 LAB — CBC WITH DIFFERENTIAL/PLATELET
Abs Immature Granulocytes: 0.02 10*3/uL (ref 0.00–0.07)
Basophils Absolute: 0 10*3/uL (ref 0.0–0.1)
Basophils Relative: 1 %
Eosinophils Absolute: 0.2 10*3/uL (ref 0.0–0.5)
Eosinophils Relative: 4 %
HCT: 42.9 % (ref 36.0–46.0)
Hemoglobin: 14 g/dL (ref 12.0–15.0)
Immature Granulocytes: 0 %
Lymphocytes Relative: 35 %
Lymphs Abs: 2.1 10*3/uL (ref 0.7–4.0)
MCH: 30.3 pg (ref 26.0–34.0)
MCHC: 32.6 g/dL (ref 30.0–36.0)
MCV: 92.9 fL (ref 80.0–100.0)
Monocytes Absolute: 0.4 10*3/uL (ref 0.1–1.0)
Monocytes Relative: 7 %
Neutro Abs: 3.3 10*3/uL (ref 1.7–7.7)
Neutrophils Relative %: 53 %
Platelets: 200 10*3/uL (ref 150–400)
RBC: 4.62 MIL/uL (ref 3.87–5.11)
RDW: 11.7 % (ref 11.5–15.5)
WBC: 6.1 10*3/uL (ref 4.0–10.5)
nRBC: 0 % (ref 0.0–0.2)

## 2019-11-06 LAB — LIPASE, BLOOD: Lipase: 34 U/L (ref 11–51)

## 2019-11-06 LAB — COMPREHENSIVE METABOLIC PANEL
ALT: 29 U/L (ref 0–44)
AST: 28 U/L (ref 15–41)
Albumin: 4.6 g/dL (ref 3.5–5.0)
Alkaline Phosphatase: 80 U/L (ref 38–126)
Anion gap: 12 (ref 5–15)
BUN: 15 mg/dL (ref 6–20)
CO2: 21 mmol/L — ABNORMAL LOW (ref 22–32)
Calcium: 9.7 mg/dL (ref 8.9–10.3)
Chloride: 106 mmol/L (ref 98–111)
Creatinine, Ser: 0.76 mg/dL (ref 0.44–1.00)
GFR calc Af Amer: >60 mL/min (ref >60)
GFR calc non Af Amer: >60 mL/min (ref >60)
Glucose, Bld: 97 mg/dL (ref 70–99)
Potassium: 4.5 mmol/L (ref 3.5–5.1)
Sodium: 139 mmol/L (ref 135–145)
Total Bilirubin: 0.5 mg/dL (ref 0.3–1.2)
Total Protein: 8 g/dL (ref 6.5–8.1)

## 2019-11-06 MED ORDER — SODIUM CHLORIDE 0.9 % IV BOLUS
1000.0000 mL | Freq: Once | INTRAVENOUS | Status: AC
  Administered 2019-11-06: 1000 mL via INTRAVENOUS

## 2019-11-06 MED ORDER — ONDANSETRON 4 MG PO TBDP
4.0000 mg | ORAL_TABLET | Freq: Three times a day (TID) | ORAL | 0 refills | Status: DC | PRN
Start: 2019-11-06 — End: 2020-01-11

## 2019-11-06 MED ORDER — FENTANYL CITRATE (PF) 100 MCG/2ML IJ SOLN
50.0000 ug | Freq: Once | INTRAMUSCULAR | Status: AC
  Administered 2019-11-06: 50 ug via INTRAVENOUS
  Filled 2019-11-06: qty 2

## 2019-11-06 MED ORDER — FENTANYL CITRATE (PF) 100 MCG/2ML IJ SOLN
100.0000 ug | Freq: Once | INTRAMUSCULAR | Status: AC
  Administered 2019-11-06: 100 ug via INTRAVENOUS
  Filled 2019-11-06: qty 2

## 2019-11-06 MED ORDER — ONDANSETRON HCL 4 MG/2ML IJ SOLN
4.0000 mg | Freq: Once | INTRAMUSCULAR | Status: AC
  Administered 2019-11-06: 4 mg via INTRAVENOUS
  Filled 2019-11-06: qty 2

## 2019-11-06 MED ORDER — IOHEXOL 300 MG/ML  SOLN
100.0000 mL | Freq: Once | INTRAMUSCULAR | Status: AC | PRN
  Administered 2019-11-06: 100 mL via INTRAVENOUS
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
## 2019-11-06 NOTE — ED Triage Notes (Signed)
Patient reports she is having interstitial cystitis flare up. Started 5-6 days ago. Patient states she tried to get an appt with her pain dr, but cannot be seen until 5/6. Pain rated 7/10

## 2019-11-06 NOTE — Discharge Instructions (Signed)
As discussed, urine has been sent for culture.  If there is anything concerning, you will be notified.  Return the emergency department for any worsening pain, abdominal pain, vomiting, fevers, any other worsening or concerning symptoms.

## 2019-11-06 NOTE — ED Notes (Signed)
Pt provided saltines and water for PO challenge.  

## 2019-11-07 LAB — URINE CULTURE: Culture: NO GROWTH

## 2020-01-10 ENCOUNTER — Encounter (HOSPITAL_COMMUNITY): Payer: Self-pay | Admitting: Emergency Medicine

## 2020-01-10 ENCOUNTER — Other Ambulatory Visit: Payer: Self-pay

## 2020-01-10 DIAGNOSIS — Z87891 Personal history of nicotine dependence: Secondary | ICD-10-CM | POA: Diagnosis not present

## 2020-01-10 DIAGNOSIS — R1030 Lower abdominal pain, unspecified: Secondary | ICD-10-CM | POA: Diagnosis present

## 2020-01-10 DIAGNOSIS — I10 Essential (primary) hypertension: Secondary | ICD-10-CM | POA: Diagnosis not present

## 2020-01-10 DIAGNOSIS — R1032 Left lower quadrant pain: Secondary | ICD-10-CM | POA: Insufficient documentation

## 2020-01-10 DIAGNOSIS — R1031 Right lower quadrant pain: Secondary | ICD-10-CM | POA: Diagnosis not present

## 2020-01-10 LAB — URINALYSIS, ROUTINE W REFLEX MICROSCOPIC
Bilirubin Urine: NEGATIVE
Glucose, UA: NEGATIVE mg/dL
Ketones, ur: NEGATIVE mg/dL
Leukocytes,Ua: NEGATIVE
Nitrite: POSITIVE — AB
Protein, ur: 30 mg/dL — AB
Specific Gravity, Urine: 1.034 — ABNORMAL HIGH (ref 1.005–1.030)
pH: 5 (ref 5.0–8.0)

## 2020-01-10 LAB — COMPREHENSIVE METABOLIC PANEL
ALT: 22 U/L (ref 0–44)
AST: 20 U/L (ref 15–41)
Albumin: 4.5 g/dL (ref 3.5–5.0)
Alkaline Phosphatase: 66 U/L (ref 38–126)
Anion gap: 9 (ref 5–15)
BUN: 19 mg/dL (ref 6–20)
CO2: 27 mmol/L (ref 22–32)
Calcium: 9 mg/dL (ref 8.9–10.3)
Chloride: 106 mmol/L (ref 98–111)
Creatinine, Ser: 0.87 mg/dL (ref 0.44–1.00)
GFR calc Af Amer: >60 mL/min (ref >60)
GFR calc non Af Amer: >60 mL/min (ref >60)
Glucose, Bld: 111 mg/dL — ABNORMAL HIGH (ref 70–99)
Potassium: 4.6 mmol/L (ref 3.5–5.1)
Sodium: 142 mmol/L (ref 135–145)
Total Bilirubin: 0.4 mg/dL (ref 0.3–1.2)
Total Protein: 7 g/dL (ref 6.5–8.1)

## 2020-01-10 LAB — CBC
HCT: 38.2 % (ref 36.0–46.0)
Hemoglobin: 12.5 g/dL (ref 12.0–15.0)
MCH: 30 pg (ref 26.0–34.0)
MCHC: 32.7 g/dL (ref 30.0–36.0)
MCV: 91.6 fL (ref 80.0–100.0)
Platelets: 251 10*3/uL (ref 150–400)
RBC: 4.17 MIL/uL (ref 3.87–5.11)
RDW: 11.9 % (ref 11.5–15.5)
WBC: 6.1 10*3/uL (ref 4.0–10.5)
nRBC: 0 % (ref 0.0–0.2)

## 2020-01-10 LAB — I-STAT BETA HCG BLOOD, ED (MC, WL, AP ONLY): I-stat hCG, quantitative: <5 m[IU]/mL (ref <5)

## 2020-01-10 LAB — LIPASE, BLOOD: Lipase: 40 U/L (ref 11–51)

## 2020-01-10 MED ORDER — SODIUM CHLORIDE 0.9% FLUSH: 3.0000 mL | Freq: Once | INTRAVENOUS | Status: DC

## 2020-01-10 MED ORDER — ONDANSETRON 4 MG PO TBDP
4.0000 mg | ORAL_TABLET | Freq: Once | ORAL | Status: AC | PRN
  Administered 2020-01-11: 4 mg via ORAL
  Filled 2020-01-10: qty 1

## 2020-01-10 NOTE — ED Triage Notes (Signed)
Patient is complaining of lower abdominal pain. Patient has a hx of bladder problems. Patient is complaining of sore throat and nausea.

## 2020-01-11 ENCOUNTER — Emergency Department (HOSPITAL_COMMUNITY)
Admission: EM | Admit: 2020-01-11 | Discharge: 2020-01-11 | Disposition: A | Discharge status: Home / Self Care | Payer: Medicare Other | Attending: Emergency Medicine | Admitting: Emergency Medicine

## 2020-01-11 DIAGNOSIS — R1032 Left lower quadrant pain: Secondary | ICD-10-CM

## 2020-01-11 DIAGNOSIS — R1031 Right lower quadrant pain: Secondary | ICD-10-CM

## 2020-01-11 LAB — GROUP A STREP BY PCR: Group A Strep by PCR: NOT DETECTED

## 2020-01-11 MED ORDER — KETOROLAC TROMETHAMINE 60 MG/2ML IM SOLN
15.0000 mg | Freq: Once | INTRAMUSCULAR | Status: AC
  Administered 2020-01-11: 15 mg via INTRAMUSCULAR
  Filled 2020-01-11: qty 2

## 2020-01-11 MED ORDER — ONDANSETRON 4 MG PO TBDP: 4.0000 mg | ORAL_TABLET | Freq: Three times a day (TID) | ORAL | 0 refills | Status: AC | PRN

## 2020-01-11 MED ORDER — LIDOCAINE VISCOUS HCL 2 % MT SOLN
15.0000 mL | Freq: Once | OROMUCOSAL | Status: AC
  Administered 2020-01-11: 15 mL via ORAL
  Filled 2020-01-11: qty 15

## 2020-01-11 MED ORDER — CEPHALEXIN 500 MG PO CAPS
500.0000 mg | ORAL_CAPSULE | Freq: Three times a day (TID) | ORAL | 0 refills | Status: AC
Start: 2020-01-11 — End: 2020-01-16

## 2020-01-11 MED ORDER — ALUM & MAG HYDROXIDE-SIMETH 200-200-20 MG/5ML PO SUSP
30.0000 mL | Freq: Once | ORAL | Status: AC
  Administered 2020-01-11: 30 mL via ORAL
  Filled 2020-01-11: qty 30

## 2020-01-11 NOTE — ED Notes (Signed)
Lab contacted to add on urine culture. 
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
## 2020-01-12 LAB — URINE CULTURE: Culture: <10000 — AB

## 2020-05-02 ENCOUNTER — Ambulatory Visit: Payer: Medicare Other | Admitting: Obstetrics and Gynecology

## 2020-05-07 ENCOUNTER — Ambulatory Visit: Payer: Medicare Other | Admitting: Obstetrics and Gynecology

## 2020-05-14 ENCOUNTER — Other Ambulatory Visit: Payer: Self-pay

## 2020-05-14 ENCOUNTER — Ambulatory Visit (INDEPENDENT_AMBULATORY_CARE_PROVIDER_SITE_OTHER): Payer: Medicare Other | Admitting: Obstetrics and Gynecology

## 2020-05-14 ENCOUNTER — Encounter: Payer: Self-pay | Admitting: Obstetrics and Gynecology

## 2020-05-14 VITALS — BP 118/76

## 2020-05-14 DIAGNOSIS — N909 Noninflammatory disorder of vulva and perineum, unspecified: Secondary | ICD-10-CM | POA: Diagnosis not present

## 2020-05-14 MED ORDER — CEPHALEXIN 250 MG PO CAPS: 250.0000 mg | ORAL_CAPSULE | Freq: Four times a day (QID) | ORAL | 0 refills | Status: AC

## 2020-05-14 NOTE — Progress Notes (Signed)
[name] 15-Feb-1981 [id number]  SUBJECTIVE:  39 y.o. G0P0 female presents for a right perineal vaginal skin cyst present for the last week.  Having some pain when sitting on it.  Pushing on it also causes pain.  She says she has had these before and typically with trying to "pop" them they drain.  She has been trying to do that with this when it has not surfaced or drained yet.  No vaginal bleeding or discharge.  No fever or chills.  Current Outpatient Medications  Medication Sig Dispense Refill  . azelastine (ASTELIN) 0.1 % nasal spray Place 1 spray into both nostrils 2 (two) times daily.    . Baclofen 5 MG TABS Take 5 mg by mouth 3 (three) times daily.    . busPIRone (BUSPAR) 10 MG tablet Take 20 mg by mouth at bedtime.     . carbamazepine (TEGRETOL) 200 MG tablet Take 200 mg by mouth 2 (two) times daily.     . hydrOXYzine (ATARAX/VISTARIL) 25 MG tablet Take 75 mg by mouth at bedtime.     Marland Kitchen levocetirizine (XYZAL) 5 MG tablet Take 5 mg by mouth at bedtime.     Marland Kitchen lisinopril (PRINIVIL,ZESTRIL) 10 MG tablet Take 10 mg by mouth daily.  2  . pantoprazole (PROTONIX) 40 MG tablet Take 40 mg by mouth 2 (two) times daily.    . rosuvastatin (CRESTOR) 20 MG tablet Take 20 mg by mouth daily.  2  . estradiol (VIVELLE-DOT) 0.1 MG/24HR patch Place 1 patch (0.1 mg total) onto the skin 2 (two) times a week. (Patient not taking: Reported on 08/13/2019) 24 patch 3   No current facility-administered medications for this visit.   Allergies: Gabapentin, Hydrocodone, Methadone, Augmentin [amoxicillin-pot clavulanate], Azithromycin, Buprenorphine, Ciprofloxacin, Erythromycin, Garlic, Hydrocodone-acetaminophen, Lurasidone, Methenamine, Morphine and related, Onion, Prozac [fluoxetine hcl], Pyridium [phenazopyridine hcl], and Uribel [meth-hyo-m bl-na phos-ph sal]  Patient's last menstrual period was 09/27/2013.  Past medical history,surgical history, problem list, medications, allergies, family history and  social history were all reviewed and documented as reviewed in the EPIC chart.  ROS: Negative other than as described in HPI  OBJECTIVE:  BP 118/76   LMP 09/27/2013  The patient appears well, alert, oriented x 3, in no distress. PELVIC EXAM: VULVA: normal appearing vulva with no masses, tenderness or lesions, pea-sized firmness about 1 cm deep to the skin at the right lateral posterior perineum/confluence of the buttock, no overlying skin erythema or visible evidence of cyst, deep palpation causes mild discomfort, not consistent with Bartholin gland cyst, VAGINA: normal appearing vagina with normal color and discharge, no lesions  Physical Exam Genitourinary:       Procedure Attempted perineal cyst aspiration The area of the cyst/subcutaneous palpable firmness was palpated and outlined with a marker.  2.5 mL of 1% lidocaine were injected into the skin overlying the palpable firmness.  Confirming adequate anesthesia, a 21 G needle was then inserted into the depth where it was felt that the needle tip would be within this firm area and 3 different attempts at aspiration at different angles resulted in no return of cyst fluid.  No further attempts were made and the procedure was concluded.  Patient tolerated the procedure well.  Chaperone: [name] present during the examination and procedure   ASSESSMENT:  39 y.o. G0P0 here for a deep perineal inflammation possibly consistent with a deeper seeded folliculitis  PLAN:  Discussed there is no visible abnormality and only with careful palpation of the area could  we define what she was probably noting as her chief complaint today.  Attempted aspiration of the cyst cavity was unsuccessful.  I think she probably has some sort of deep-seeded folliculitis that will eventually surface, but I did not think it would be worth it to attempt incision and drainage as this would probably cause her more pain than it would be worth especially since the  palpated firmness was fairly vague and not any clearly defined cyst or abscess.  In the meantime I offered to give her a course of Keflex 250 mg 4 times daily for 7 days.  As she has many sensitivities to various antibiotics if she does not tolerate this antibiotic then I do not think there are many other feasible options for this kind of empiric treatment.  I encouraged her to stop trying to drain the cyst on her own as this is likely exacerbating the pain in the area.  If she has any signs of infections, purulence, or spreading erythema she needs to come in for further evaluation.   Theresia Majors MD 05/14/20

## 2020-06-04 ENCOUNTER — Emergency Department (HOSPITAL_COMMUNITY)
Admission: EM | Admit: 2020-06-04 | Discharge: 2020-06-04 | Disposition: A | Discharge status: Home / Self Care | Payer: Medicare Other | Attending: Emergency Medicine | Admitting: Emergency Medicine

## 2020-06-04 ENCOUNTER — Other Ambulatory Visit: Payer: Self-pay

## 2020-06-04 DIAGNOSIS — I10 Essential (primary) hypertension: Secondary | ICD-10-CM | POA: Insufficient documentation

## 2020-06-04 DIAGNOSIS — Z9851 Tubal ligation status: Secondary | ICD-10-CM | POA: Diagnosis not present

## 2020-06-04 DIAGNOSIS — N3010 Interstitial cystitis (chronic) without hematuria: Secondary | ICD-10-CM | POA: Diagnosis not present

## 2020-06-04 DIAGNOSIS — R110 Nausea: Secondary | ICD-10-CM | POA: Insufficient documentation

## 2020-06-04 DIAGNOSIS — Z79899 Other long term (current) drug therapy: Secondary | ICD-10-CM | POA: Insufficient documentation

## 2020-06-04 DIAGNOSIS — M791 Myalgia, unspecified site: Secondary | ICD-10-CM | POA: Insufficient documentation

## 2020-06-04 DIAGNOSIS — R52 Pain, unspecified: Secondary | ICD-10-CM | POA: Diagnosis present

## 2020-06-04 DIAGNOSIS — Z87891 Personal history of nicotine dependence: Secondary | ICD-10-CM | POA: Diagnosis not present

## 2020-06-04 LAB — COMPREHENSIVE METABOLIC PANEL
ALT: 26 U/L (ref 0–44)
AST: 30 U/L (ref 15–41)
Albumin: 4.9 g/dL (ref 3.5–5.0)
Alkaline Phosphatase: 76 U/L (ref 38–126)
Anion gap: 12 (ref 5–15)
BUN: 16 mg/dL (ref 6–20)
CO2: 25 mmol/L (ref 22–32)
Calcium: 9.6 mg/dL (ref 8.9–10.3)
Chloride: 105 mmol/L (ref 98–111)
Creatinine, Ser: 1.06 mg/dL — ABNORMAL HIGH (ref 0.44–1.00)
GFR, Estimated: >60 mL/min (ref >60)
Glucose, Bld: 97 mg/dL (ref 70–99)
Potassium: 3.8 mmol/L (ref 3.5–5.1)
Sodium: 142 mmol/L (ref 135–145)
Total Bilirubin: 0.6 mg/dL (ref 0.3–1.2)
Total Protein: 7.4 g/dL (ref 6.5–8.1)

## 2020-06-04 LAB — URINALYSIS, ROUTINE W REFLEX MICROSCOPIC
Bacteria, UA: NONE SEEN
Bilirubin Urine: NEGATIVE
Glucose, UA: NEGATIVE mg/dL
Ketones, ur: NEGATIVE mg/dL
Leukocytes,Ua: NEGATIVE
Nitrite: NEGATIVE
Protein, ur: NEGATIVE mg/dL
Specific Gravity, Urine: 1.023 (ref 1.005–1.030)
pH: 5 (ref 5.0–8.0)

## 2020-06-04 LAB — CBC
HCT: 40.9 % (ref 36.0–46.0)
Hemoglobin: 13.2 g/dL (ref 12.0–15.0)
MCH: 29.3 pg (ref 26.0–34.0)
MCHC: 32.3 g/dL (ref 30.0–36.0)
MCV: 90.9 fL (ref 80.0–100.0)
Platelets: 263 10*3/uL (ref 150–400)
RBC: 4.5 MIL/uL (ref 3.87–5.11)
RDW: 12.3 % (ref 11.5–15.5)
WBC: 5.9 10*3/uL (ref 4.0–10.5)
nRBC: 0 % (ref 0.0–0.2)

## 2020-06-04 LAB — TSH: TSH: 2.356 u[IU]/mL (ref 0.350–4.500)

## 2020-06-04 LAB — LIPASE, BLOOD: Lipase: 64 U/L — ABNORMAL HIGH (ref 11–51)

## 2020-06-04 MED ORDER — KETOROLAC TROMETHAMINE 30 MG/ML IJ SOLN
30.0000 mg | Freq: Once | INTRAMUSCULAR | Status: AC
  Administered 2020-06-04: 30 mg via INTRAVENOUS
  Filled 2020-06-04: qty 1

## 2020-06-04 MED ORDER — SODIUM CHLORIDE 0.9 % IV SOLN: 1000.0000 mL | INTRAVENOUS | Status: DC

## 2020-06-04 MED ORDER — SODIUM CHLORIDE 0.9 % IV BOLUS (SEPSIS)
1000.0000 mL | Freq: Once | INTRAVENOUS | Status: AC
  Administered 2020-06-04: 1000 mL via INTRAVENOUS

## 2020-06-04 MED ORDER — LORAZEPAM 2 MG/ML IJ SOLN
1.0000 mg | Freq: Once | INTRAMUSCULAR | Status: AC
  Administered 2020-06-04: 1 mg via INTRAVENOUS
  Filled 2020-06-04: qty 1
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
## 2020-06-04 NOTE — ED Triage Notes (Addendum)
Pt arrived via walk in, c/o generalized body pain throughout entire body, that she states happens with her interstitial cystitis flare ups, but denies any urinary sx at this time.  Also c/o diarrhea and nausea that all started last night.

## 2020-06-04 NOTE — ED Provider Notes (Signed)
  Physical Exam  BP 121/76   Pulse (!) 57   Temp 98.6 F (37 C) (Oral)   Resp 16   Ht 5\' 2"  (1.575 m)   Wt 69.4 kg   LMP 09/27/2013   SpO2 100%   BMI 27.98 kg/m   Physical Exam  ED Course/Procedures   Clinical Course as of Jun 05 1747  Wed Jun 04, 2020  1713 Patient states she is feeling better after treatment   [JK]    Clinical Course User Index [JK] 1714, MD    Procedures  MDM  Patient feels as if she is having a flare of her interstitial cystitis.  Received in signout.  Lab work reviewed.  Had an pending urinalysis.  Urinalysis does not show infection.  Patient feels better after treatment.  Will discharge home.      Linwood Dibbles, MD 06/04/20 [phone]

## 2020-06-04 NOTE — Discharge Instructions (Addendum)
Continue your current medications.   Follow up with your doctor for persistent symptoms.  Return to the ED as needed for worsening symptoms, fever.

## 2020-06-16 ENCOUNTER — Emergency Department (HOSPITAL_COMMUNITY)
Admission: EM | Admit: 2020-06-16 | Discharge: 2020-06-16 | Disposition: A | Discharge status: Home / Self Care | Payer: Medicare Other | Attending: Emergency Medicine | Admitting: Emergency Medicine

## 2020-06-16 ENCOUNTER — Encounter (HOSPITAL_COMMUNITY): Payer: Self-pay

## 2020-06-16 ENCOUNTER — Other Ambulatory Visit: Payer: Self-pay

## 2020-06-16 ENCOUNTER — Emergency Department (HOSPITAL_COMMUNITY): Payer: Medicare Other

## 2020-06-16 DIAGNOSIS — I10 Essential (primary) hypertension: Secondary | ICD-10-CM | POA: Insufficient documentation

## 2020-06-16 DIAGNOSIS — Z79899 Other long term (current) drug therapy: Secondary | ICD-10-CM | POA: Diagnosis not present

## 2020-06-16 DIAGNOSIS — Z87891 Personal history of nicotine dependence: Secondary | ICD-10-CM | POA: Diagnosis not present

## 2020-06-16 DIAGNOSIS — R070 Pain in throat: Secondary | ICD-10-CM | POA: Diagnosis present

## 2020-06-16 DIAGNOSIS — J029 Acute pharyngitis, unspecified: Secondary | ICD-10-CM | POA: Insufficient documentation

## 2020-06-16 LAB — CBC WITH DIFFERENTIAL/PLATELET
Abs Immature Granulocytes: 0.03 10*3/uL (ref 0.00–0.07)
Basophils Absolute: 0 10*3/uL (ref 0.0–0.1)
Basophils Relative: 1 %
Eosinophils Absolute: 0.3 10*3/uL (ref 0.0–0.5)
Eosinophils Relative: 6 %
HCT: 39.4 % (ref 36.0–46.0)
Hemoglobin: 12.7 g/dL (ref 12.0–15.0)
Immature Granulocytes: 1 %
Lymphocytes Relative: 35 %
Lymphs Abs: 1.8 10*3/uL (ref 0.7–4.0)
MCH: 29.7 pg (ref 26.0–34.0)
MCHC: 32.2 g/dL (ref 30.0–36.0)
MCV: 92.3 fL (ref 80.0–100.0)
Monocytes Absolute: 0.3 10*3/uL (ref 0.1–1.0)
Monocytes Relative: 6 %
Neutro Abs: 2.7 10*3/uL (ref 1.7–7.7)
Neutrophils Relative %: 51 %
Platelets: 294 10*3/uL (ref 150–400)
RBC: 4.27 MIL/uL (ref 3.87–5.11)
RDW: 12.5 % (ref 11.5–15.5)
WBC: 5.3 10*3/uL (ref 4.0–10.5)
nRBC: 0 % (ref 0.0–0.2)

## 2020-06-16 LAB — BASIC METABOLIC PANEL
Anion gap: 7 (ref 5–15)
BUN: 15 mg/dL (ref 6–20)
CO2: 28 mmol/L (ref 22–32)
Calcium: 9.5 mg/dL (ref 8.9–10.3)
Chloride: 107 mmol/L (ref 98–111)
Creatinine, Ser: 0.93 mg/dL (ref 0.44–1.00)
GFR, Estimated: >60 mL/min (ref >60)
Glucose, Bld: 95 mg/dL (ref 70–99)
Potassium: 3.7 mmol/L (ref 3.5–5.1)
Sodium: 142 mmol/L (ref 135–145)

## 2020-06-16 MED ORDER — IOHEXOL 300 MG/ML  SOLN
75.0000 mL | Freq: Once | INTRAMUSCULAR | Status: AC | PRN
  Administered 2020-06-16: 75 mL via INTRAVENOUS

## 2020-06-16 MED ORDER — SODIUM CHLORIDE (PF) 0.9 % IJ SOLN
INTRAMUSCULAR | Status: AC
  Filled 2020-06-16: qty 50
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
## 2020-06-16 NOTE — Discharge Instructions (Addendum)
Follow-up with your primary care doctor.  You may need a referral to an ENT.  Return here as needed if you have any worsening symptoms.

## 2020-06-16 NOTE — ED Notes (Signed)
Pt transported to CT ?

## 2020-06-16 NOTE — ED Triage Notes (Signed)
Pt reports throat pain that started about 3-4 days ago. Pt states it became worse yesterday. Pt reports hx of thyroid issues and is concerned this is related to that.

## 2020-06-29 ENCOUNTER — Emergency Department (HOSPITAL_COMMUNITY)
Admission: EM | Admit: 2020-06-29 | Discharge: 2020-06-29 | Disposition: A | Discharge status: Home / Self Care | Payer: Medicare Other | Attending: Emergency Medicine | Admitting: Emergency Medicine

## 2020-06-29 ENCOUNTER — Other Ambulatory Visit: Payer: Self-pay

## 2020-06-29 DIAGNOSIS — Z79899 Other long term (current) drug therapy: Secondary | ICD-10-CM | POA: Diagnosis not present

## 2020-06-29 DIAGNOSIS — G43909 Migraine, unspecified, not intractable, without status migrainosus: Secondary | ICD-10-CM | POA: Diagnosis not present

## 2020-06-29 DIAGNOSIS — I10 Essential (primary) hypertension: Secondary | ICD-10-CM | POA: Insufficient documentation

## 2020-06-29 DIAGNOSIS — Z87891 Personal history of nicotine dependence: Secondary | ICD-10-CM | POA: Diagnosis not present

## 2020-06-29 DIAGNOSIS — G43809 Other migraine, not intractable, without status migrainosus: Secondary | ICD-10-CM

## 2020-06-29 MED ORDER — METOCLOPRAMIDE HCL 5 MG/ML IJ SOLN
10.0000 mg | Freq: Once | INTRAMUSCULAR | Status: AC
  Administered 2020-06-29: 10 mg via INTRAMUSCULAR
  Filled 2020-06-29: qty 2

## 2020-06-29 MED ORDER — MORPHINE SULFATE (PF) 4 MG/ML IV SOLN
5.0000 mg | Freq: Once | INTRAVENOUS | Status: AC
  Administered 2020-06-29: 5 mg via INTRAMUSCULAR
  Filled 2020-06-29: qty 2

## 2020-06-29 MED ORDER — DIPHENHYDRAMINE HCL 50 MG/ML IJ SOLN
25.0000 mg | Freq: Once | INTRAMUSCULAR | Status: AC
  Administered 2020-06-29: 25 mg via INTRAMUSCULAR
  Filled 2020-06-29: qty 1

## 2020-06-29 NOTE — ED Triage Notes (Signed)
Recently taken off of Lisinopril 5 days ago due to adverse effects and states that she has had a migraine since then. Has since then been placed on another BP medication and states that the migraine has not went away. Pain 9/10. +nausea and photosensitivity
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
## 2020-07-14 ENCOUNTER — Encounter (HOSPITAL_COMMUNITY): Payer: Self-pay | Admitting: Emergency Medicine

## 2020-07-14 ENCOUNTER — Ambulatory Visit (HOSPITAL_COMMUNITY)
Admission: EM | Admit: 2020-07-14 | Discharge: 2020-07-14 | Disposition: A | Discharge status: Home / Self Care | Payer: Medicare Other | Attending: Urgent Care | Admitting: Urgent Care

## 2020-07-14 ENCOUNTER — Other Ambulatory Visit: Payer: Self-pay

## 2020-07-14 DIAGNOSIS — R197 Diarrhea, unspecified: Secondary | ICD-10-CM

## 2020-07-14 DIAGNOSIS — R195 Other fecal abnormalities: Secondary | ICD-10-CM | POA: Diagnosis not present

## 2020-07-14 DIAGNOSIS — R509 Fever, unspecified: Secondary | ICD-10-CM

## 2020-07-14 DIAGNOSIS — R12 Heartburn: Secondary | ICD-10-CM | POA: Diagnosis not present

## 2020-07-14 DIAGNOSIS — R1084 Generalized abdominal pain: Secondary | ICD-10-CM | POA: Diagnosis not present

## 2020-07-14 DIAGNOSIS — R112 Nausea with vomiting, unspecified: Secondary | ICD-10-CM

## 2020-07-14 LAB — POCT URINALYSIS DIPSTICK, ED / UC
Bilirubin Urine: NEGATIVE
Glucose, UA: NEGATIVE mg/dL
Ketones, ur: NEGATIVE mg/dL
Leukocytes,Ua: NEGATIVE
Nitrite: NEGATIVE
Protein, ur: 30 mg/dL — AB
Specific Gravity, Urine: >=1.03 (ref 1.005–1.030)
Urobilinogen, UA: 0.2 mg/dL (ref 0.0–1.0)
pH: 5.5 (ref 5.0–8.0)

## 2020-07-14 MED ORDER — ONDANSETRON 4 MG PO TBDP
ORAL_TABLET | ORAL | Status: AC
  Filled 2020-07-14: qty 1

## 2020-07-14 MED ORDER — ONDANSETRON 8 MG PO TBDP: 8.0000 mg | ORAL_TABLET | Freq: Three times a day (TID) | ORAL | 0 refills | Status: AC | PRN

## 2020-07-14 MED ORDER — ONDANSETRON 4 MG PO TBDP
4.0000 mg | ORAL_TABLET | Freq: Once | ORAL | Status: AC
  Administered 2020-07-14: 4 mg via ORAL

## 2020-07-14 MED ORDER — LOPERAMIDE HCL 2 MG PO CAPS: 2.0000 mg | ORAL_CAPSULE | Freq: Two times a day (BID) | ORAL | 0 refills | Status: AC | PRN

## 2020-07-14 NOTE — ED Triage Notes (Signed)
Pt presents with fever, abdominal pain, nausea, vomiting, runny nose and blood in stools xs 7 days.

## 2020-07-14 NOTE — ED Provider Notes (Signed)
[facility]   MRN: [id number] DOB: 1980-09-27  Subjective:   [name] is a 40 y.o. female presenting for 7 day history of nausea with vomiting, urinary frequency, watery and bloody diarrhea. 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 Her grandmother was actually just hospitalized for a GI infection but cannot recall the name of the organism. Denies dysuria, hematuria, chest pain, shob. Has had generalized abdominal pain as well. Has a script for Zofran but has not used it.   No current facility-administered medications for this encounter.  Current Outpatient Medications:  .  azelastine (ASTELIN) 0.1 % nasal spray, Place 1 spray into both nostrils 2 (two) times daily., Disp: , Rfl:  .  baclofen (LIORESAL) 10 MG tablet, Take 10 mg by mouth daily., Disp: , Rfl:  .  benzonatate (TESSALON) 100 MG capsule, Take 100 mg by mouth 2 (two) times daily as needed for cough., Disp: , Rfl:  .  busPIRone (BUSPAR) 10 MG tablet, Take 10-15 mg by mouth See admin instructions. Taking 10mg  in the AM and 1 & 1/2 tab (15mg ) at night, Disp: , Rfl:  .  docusate sodium (COLACE) 100 MG capsule, Take 100 mg by mouth daily as needed for mild constipation., Disp: , Rfl:  .  estradiol (VIVELLE-DOT) 0.1 MG/24HR patch, Place 1 patch (0.1 mg total) onto the skin 2 (two) times a week. (Patient not taking: Reported on 08/13/2019), Disp: 24 patch, Rfl: 3 .  famotidine (PEPCID) 40 MG tablet, Take 40 mg by mouth daily., Disp: , Rfl:  .  hydrOXYzine (ATARAX/VISTARIL) 25 MG tablet, Take 20-50 mg by mouth See admin instructions. Taking one tab in the AM (25mg ) and 2 tabs (50mg ) at bedtime, Disp: , Rfl:  .  levocetirizine (XYZAL) 5 MG tablet, Take 5 mg by mouth at bedtime. , Disp: , Rfl:  .  lisinopril (PRINIVIL,ZESTRIL) 10 MG tablet, Take 10 mg by mouth daily., Disp: , Rfl: 2 .  loperamide (IMODIUM) 2 MG capsule, Take 2 mg by mouth as needed for diarrhea or loose stools., Disp: , Rfl:  .  naproxen  sodium (ALEVE) 220 MG tablet, Take 220 mg by mouth daily as needed (pain)., Disp: , Rfl:  .  ondansetron (ZOFRAN-ODT) 4 MG disintegrating tablet, Take 4 mg by mouth every 8 (eight) hours as needed for nausea/vomiting., Disp: , Rfl:  .  phenazopyridine (PYRIDIUM) 95 MG tablet, Take 95 mg by mouth 3 (three) times daily as needed for pain., Disp: , Rfl:  .  rosuvastatin (CRESTOR) 20 MG tablet, Take 20 mg by mouth daily., Disp: , Rfl: 2   Allergies  Allergen Reactions  . Gabapentin Other (See Comments)    Reaction:  Suicidal thoughts   . Hydrocodone Shortness Of Breath  . Methadone Other (See Comments)    Confusion/fainting  . Augmentin [Amoxicillin-Pot Clavulanate] Nausea And Vomiting and Other (See Comments)    Has patient had a PCN reaction causing immediate rash, facial/tongue/throat swelling, SOB or lightheadedness with hypotension: No Has patient had a PCN reaction causing severe rash involving mucus membranes or skin necrosis: No Has patient had a PCN reaction that required hospitalization No Has patient had a PCN reaction occurring within the last 10 years: No If all of the above answers are "NO", then may proceed with Cephalosporin use.  . Azithromycin Other (See Comments)    Pt states that this medication does not work for her.    . Buprenorphine Itching  . Ciprofloxacin Nausea Only  .  Erythromycin Nausea And Vomiting  . Garlic Other (See Comments)    Reaction:  Bladder issues   . Hydrocodone-Acetaminophen Other (See Comments)    Pt states that this medication made her pass out.    . Lurasidone Nausea And Vomiting  . Methenamine Other (See Comments)    Reaction:  Urinary retention   . Morphine And Related Itching  . Onion Nausea And Vomiting  . Prozac [Fluoxetine Hcl] Other (See Comments)    Reaction:  Suicidal thoughts   . Pyridium [Phenazopyridine Hcl] Other (See Comments)    Reaction:  Bladder pain   . Uribel [Meth-Hyo-M Bl-Na Phos-Ph Sal] Other (See Comments)     Reaction:  Bladder issues     Past Medical History:  Diagnosis Date  . Anxiety   . Bipolar 1 disorder (HCC)   . Bipolar 1 disorder (HCC)   . Chronic back pain   . Endometriosis   . Galactorrhea   . GERD (gastroesophageal reflux disease)   . Hypertension   . IBS (irritable bowel syndrome)   . IC (interstitial cystitis)   . Insomnia   . Interstitial cystitis   . Mental disorder   . Migraine   . PTSD (post-traumatic stress disorder)   . Seasonal allergies   . Sinusitis      Past Surgical History:  Procedure Laterality Date  . ABDOMINAL HYSTERECTOMY  10/16/2013  . ABDOMINAL SURGERY    . BLADDER SURGERY     distenstion numerous times  . BOWEL RESECTION  1998   prolapsed bowel  . BUNIONECTOMY    . FOOT SURGERY     bunionectomy bil. pins put in and taken out  . INTERSTIM IMPLANT PLACEMENT    . LAPAROSCOPY Right 06/13/2013   Procedure: LAPAROSCOPY OPERATIVE WITH OVARIAN CYSTECTOMY convert to open 1329;  Surgeon: Purcell Nails, MD;  Location: [facility];  Service: Gynecology;  Laterality: Right;  . LAPAROTOMY Right 06/13/2013   Procedure: EXPLORATORY LAPAROTOMY, ovarian cyst;  Surgeon: Purcell Nails, MD;  Location: [facility];  Service: Gynecology;  Laterality: Right;  . OOPHORECTOMY     BSO  . TUBAL LIGATION      Family History  Problem Relation Age of Onset  . Hypertension Mother   . Esophageal cancer Maternal Grandmother   . Hypertension Maternal Grandmother   . Heart disease Maternal Grandmother   . Stomach cancer Maternal Grandmother   . Breast cancer Maternal Grandmother 20  . Diabetes Maternal Grandfather   . Stroke Maternal Grandfather   . Depression Maternal Grandfather   . Anxiety disorder Maternal Grandfather   . Hypertension Maternal Grandfather   . Heart disease Maternal Grandfather     Social History   Tobacco Use  . Smoking status: Former Smoker    Years: 2.50    Types: Cigars    Quit date: 06/06/2001    Years since quitting: 19.1  . Smokeless  tobacco: Never Used  Vaping Use  . Vaping Use: Never used  Substance Use Topics  . Alcohol use: Not Currently    Comment: occasionally  . Drug use: No    Comment: tramadol prior to surgery on dec 3rd    ROS   Objective:   Vitals: BP (!) 152/86 (BP Location: Left Arm)   Pulse (!) 122   Temp 98.6 F (37 C) (Oral)   Resp 18   LMP 09/27/2013   Physical Exam Constitutional:      General: She is not in acute distress.    Appearance: Normal  appearance. She is well-developed and normal weight. She is not ill-appearing, toxic-appearing or diaphoretic.  HENT:     Head: Normocephalic and atraumatic.     Right Ear: External ear normal.     Left Ear: External ear normal.     Nose: Nose normal.     Mouth/Throat:     Mouth: Mucous membranes are moist.     Pharynx: Oropharynx is clear.  Eyes:     General: No scleral icterus.    Extraocular Movements: Extraocular movements intact.     Pupils: Pupils are equal, round, and reactive to light.  Cardiovascular:     Rate and Rhythm: Normal rate and regular rhythm.     Pulses: Normal pulses.     Heart sounds: Normal heart sounds. No murmur heard. No friction rub. No gallop.   Pulmonary:     Effort: Pulmonary effort is normal. No respiratory distress.     Breath sounds: Normal breath sounds. No stridor. No wheezing, rhonchi or rales.  Abdominal:     General: Bowel sounds are normal. There is no distension.     Palpations: Abdomen is soft. There is no mass.     Tenderness: There is generalized abdominal tenderness. There is no right CVA tenderness, left CVA tenderness, guarding or rebound.  Skin:    General: Skin is warm and dry.     Coloration: Skin is not pale.     Findings: No rash.  Neurological:     General: No focal deficit present.     Mental Status: She is alert and oriented to person, place, and time.  Psychiatric:        Mood and Affect: Mood normal.        Behavior: Behavior normal.        Thought Content: Thought  content normal.        Judgment: Judgment normal.     Results for orders placed or performed during the hospital encounter of 07/14/20 (from the past 24 hour(s))  POC Urinalysis dipstick     Status: Abnormal   Collection Time: 07/14/20  7:56 PM  Result Value Ref Range   Glucose, UA NEGATIVE NEGATIVE mg/dL   Bilirubin Urine NEGATIVE NEGATIVE   Ketones, ur NEGATIVE NEGATIVE mg/dL   Specific Gravity, Urine >=1.030 1.005 - 1.030   Hgb urine dipstick MODERATE (A) NEGATIVE   pH 5.5 5.0 - 8.0   Protein, ur 30 (A) NEGATIVE mg/dL   Urobilinogen, UA 0.2 0.0 - 1.0 mg/dL   Nitrite NEGATIVE NEGATIVE   Leukocytes,Ua NEGATIVE NEGATIVE     Assessment and Plan :   PDMP not reviewed this encounter.  1. Fever, unspecified   2. Generalized abdominal pain   3. Nausea and vomiting, intractability of vomiting not specified, unspecified vomiting type   4. Bloody diarrhea     Will base antibiotic use off of her test results. In the meantime, use supportive care including fluids, rest. Counseled patient on potential for adverse effects with medications prescribed/recommended today, ER and return-to-clinic precautions discussed, patient verbalized understanding.    Jaynee Eagles, [place] 07/15/20 [phone]

## 2020-07-14 NOTE — Discharge Instructions (Addendum)
We will treat based off of your results. Make sure you push fluids drinking mostly water but mix it with Gatorade.  Try to eat light meals including soups, broths and soft foods, fruits.  You may use Zofran for your nausea and vomiting once every 8 hours.  Imodium can help with diarrhea but use this carefully limiting it to 1-2 times per day only if you are having a lot of diarrhea.  Please return to the clinic if symptoms worsen or you start having severe abdominal pain not helped by taking Tylenol or start having bloody stools or blood in the vomit.

## 2020-08-20 ENCOUNTER — Encounter: Payer: Self-pay | Admitting: Obstetrics and Gynecology

## 2020-08-20 ENCOUNTER — Ambulatory Visit (INDEPENDENT_AMBULATORY_CARE_PROVIDER_SITE_OTHER): Payer: Medicare Other | Admitting: Obstetrics and Gynecology

## 2020-08-20 ENCOUNTER — Other Ambulatory Visit: Payer: Self-pay

## 2020-08-20 VITALS — BP 132/88 | HR 84 | Resp 20

## 2020-08-20 DIAGNOSIS — R5383 Other fatigue: Secondary | ICD-10-CM | POA: Diagnosis not present

## 2020-08-20 DIAGNOSIS — N943 Premenstrual tension syndrome: Secondary | ICD-10-CM | POA: Diagnosis not present

## 2020-08-20 DIAGNOSIS — R6883 Chills (without fever): Secondary | ICD-10-CM | POA: Diagnosis not present

## 2020-08-20 DIAGNOSIS — N951 Menopausal and female climacteric states: Secondary | ICD-10-CM

## 2020-08-20 NOTE — Progress Notes (Signed)
Tracie L Mangrum 03/31/1981 [id number]  SUBJECTIVE:  40 y.o. G0P0 female with history of prior TAH and RSO followed by [facility] presents for symptoms reminiscent of premenstrual syndrome, which she has had in the past.  Had transitioned off of hormone therapy last year as she feels that this made her bladder pain from interstitial cystitis worse.  She periodically every few months will get symptoms that are reminiscent of PMS.  Typically they go away within about 5 days in the months that she does get the symptoms. Is recovering from COVID-19 infection 1 month ago, was fully vaccinated and had the booster, still having some shortness of breath and fatigue.  Not feverish at this point. 7-9 days now of mood changes and cramps feeling like when she was on her period in the past, no vaginal bleeding, having also more fatigue and alternative cold then hot sensations intermittently.  Used to have more night sweats which have not been much of a problem since come off of the HRT.  Reports a remote history of thyroid dysregulation in the past.   Current Outpatient Medications  Medication Sig Dispense Refill  . azelastine (ASTELIN) 0.1 % nasal spray Place 1 spray into both nostrils 2 (two) times daily.    . baclofen (LIORESAL) 10 MG tablet Take 10 mg by mouth daily.    . benzonatate (TESSALON) 100 MG capsule Take 100 mg by mouth 2 (two) times daily as needed for cough.    . busPIRone (BUSPAR) 10 MG tablet Take 10-15 mg by mouth See admin instructions. Taking 10mg  in the AM and 1 & 1/2 tab (15mg ) at night    . docusate sodium (COLACE) 100 MG capsule Take 100 mg by mouth daily as needed for mild constipation.    estradiol (VIVELLE-DOT) 0.1 MG/24HR patch Place 1 patch (0.1 mg total) onto the skin 2 (two) times a week. (Patient not taking: Reported on 08/13/2019) 24 patch 3  . famotidine (PEPCID) 40 MG tablet Take 40 mg by mouth daily.    . hydrOXYzine (ATARAX/VISTARIL) 25 MG tablet Take 20-50 mg by mouth See admin  instructions. Taking one tab in the AM (25mg ) and 2 tabs (50mg ) at bedtime    . levocetirizine (XYZAL) 5 MG tablet Take 5 mg by mouth at bedtime.     Marland Kitchen lisinopril (PRINIVIL,ZESTRIL) 10 MG tablet Take 10 mg by mouth daily.  2  . loperamide (IMODIUM) 2 MG capsule Take 1 capsule (2 mg total) by mouth 2 (two) times daily as needed for diarrhea or loose stools. 14 capsule 0  . naproxen sodium (ALEVE) 220 MG tablet Take 220 mg by mouth daily as needed (pain).    . ondansetron (ZOFRAN-ODT) 8 MG disintegrating tablet Take 1 tablet (8 mg total) by mouth every 8 (eight) hours as needed for nausea or vomiting. 20 tablet 0  . phenazopyridine (PYRIDIUM) 95 MG tablet Take 95 mg by mouth 3 (three) times daily as needed for pain.    . rosuvastatin (CRESTOR) 20 MG tablet Take 20 mg by mouth daily.  2   No current facility-administered medications for this visit.   Allergies: Gabapentin, Hydrocodone, Methadone, Augmentin [amoxicillin-pot clavulanate], Azithromycin, Buprenorphine, Ciprofloxacin, Erythromycin, Garlic, Hydrocodone-acetaminophen, Lurasidone, Methenamine, Morphine and related, Onion, Prozac [fluoxetine hcl], Pyridium [phenazopyridine hcl], and Uribel [meth-hyo-m bl-na phos-ph sal]  Patient's last menstrual period was 09/27/2013.  Past medical history,surgical history, problem list, medications, allergies, family history and social history were all reviewed and documented as reviewed in the EPIC chart.  ROS:  See hpi   OBJECTIVE:  BP 132/88 (BP Location: Right Arm, Patient Position: Sitting)   Pulse 84   Resp 20   LMP 09/27/2013  The patient appears well, alert, oriented x 3, proper affect, in no distress. PELVIC EXAM: Deferred   ASSESSMENT:  40 y.o. G0P0 here for symptoms reminiscent of PMS  PLAN:  We discussed without ovaries and recent [facility] testing well into the menopausal range, may not be experiencing effects from PMS at all.  Uncertain with the recent COVID-19 infection and how that  would possibly tie into this.  Reports history of thyroid dysregulation in the past.  Will check basic gynecologic labs today including FSH, LH, estradiol, TSH.  Will check CBC given her recent illness and fatigue symptoms.  If no improvement in her symptoms then she may need to discuss with her primary for further work-up and/or consider going back on HRT to see if that makes any difference for her.  She is agreeable to this plan and will follow up as needed.   Theresia Majors MD 08/20/20

## 2020-08-21 LAB — CBC WITH DIFFERENTIAL/PLATELET
Absolute Monocytes: 609 {cells}/uL (ref 200–950)
Basophils Absolute: 42 {cells}/uL (ref 0–200)
Basophils Relative: 0.6 %
Eosinophils Absolute: 539 {cells}/uL — ABNORMAL HIGH (ref 15–500)
Eosinophils Relative: 7.7 %
HCT: 39.8 % (ref 35.0–45.0)
Hemoglobin: 13.2 g/dL (ref 11.7–15.5)
Lymphs Abs: 2191 {cells}/uL (ref 850–3900)
MCH: 29.3 pg (ref 27.0–33.0)
MCHC: 33.2 g/dL (ref 32.0–36.0)
MCV: 88.2 fL (ref 80.0–100.0)
MPV: 11.1 fL (ref 7.5–12.5)
Monocytes Relative: 8.7 %
Neutro Abs: 3619 {cells}/uL (ref 1500–7800)
Neutrophils Relative %: 51.7 %
Platelets: 259 Thousand/uL (ref 140–400)
RBC: 4.51 Million/uL (ref 3.80–5.10)
RDW: 12.2 % (ref 11.0–15.0)
Total Lymphocyte: 31.3 %
WBC: 7 Thousand/uL (ref 3.8–10.8)

## 2020-08-21 LAB — FOLLICLE STIMULATING HORMONE: FSH: 88.7 m[IU]/mL

## 2020-08-21 LAB — LUTEINIZING HORMONE: LH: 34.1 m[IU]/mL

## 2020-08-21 LAB — ESTRADIOL: Estradiol: <15 pg/mL

## 2020-08-21 LAB — TSH: TSH: 1.58 m[IU]/L

## 2020-09-01 ENCOUNTER — Ambulatory Visit (HOSPITAL_COMMUNITY)
Admission: EM | Admit: 2020-09-01 | Discharge: 2020-09-01 | Disposition: A | Discharge status: Home / Self Care | Payer: Medicare Other

## 2020-09-01 ENCOUNTER — Other Ambulatory Visit: Payer: Self-pay

## 2020-09-01 DIAGNOSIS — Z8719 Personal history of other diseases of the digestive system: Secondary | ICD-10-CM | POA: Diagnosis not present

## 2020-09-01 DIAGNOSIS — Z09 Encounter for follow-up examination after completed treatment for conditions other than malignant neoplasm: Secondary | ICD-10-CM

## 2020-09-01 DIAGNOSIS — K5901 Slow transit constipation: Secondary | ICD-10-CM | POA: Diagnosis not present
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
## 2020-09-01 NOTE — Discharge Instructions (Addendum)
-  Continue using stool softener, 1-2 pills daily. Enemas as needed.  -Drink plenty of water and eat leafy green vegetables -If you stop having normal bowel movements (1 per day), develop abdominal pain, stop passing gas, etc- head straigt to the ED

## 2020-09-01 NOTE — ED Triage Notes (Signed)
Pt reports she thought she had a colon blockage because of ABD pain , she was not passing gas . Pt  Reports she used an enema earlier today but she did not have a stool. Pt reports she has had a blockage . Since Pt has been in waiting room she has gone to bath room and had a BM.

## 2020-11-01 ENCOUNTER — Encounter (HOSPITAL_BASED_OUTPATIENT_CLINIC_OR_DEPARTMENT_OTHER): Payer: Self-pay | Admitting: *Deleted

## 2020-11-01 ENCOUNTER — Emergency Department (HOSPITAL_BASED_OUTPATIENT_CLINIC_OR_DEPARTMENT_OTHER)
Admission: EM | Admit: 2020-11-01 | Discharge: 2020-11-01 | Disposition: A | Discharge status: Home / Self Care | Payer: Medicare Other | Attending: Emergency Medicine | Admitting: Emergency Medicine

## 2020-11-01 ENCOUNTER — Other Ambulatory Visit: Payer: Self-pay

## 2020-11-01 DIAGNOSIS — N3000 Acute cystitis without hematuria: Secondary | ICD-10-CM | POA: Diagnosis not present

## 2020-11-01 DIAGNOSIS — I10 Essential (primary) hypertension: Secondary | ICD-10-CM | POA: Diagnosis not present

## 2020-11-01 DIAGNOSIS — R300 Dysuria: Secondary | ICD-10-CM | POA: Diagnosis present

## 2020-11-01 DIAGNOSIS — Z87891 Personal history of nicotine dependence: Secondary | ICD-10-CM | POA: Diagnosis not present

## 2020-11-01 DIAGNOSIS — Z79899 Other long term (current) drug therapy: Secondary | ICD-10-CM | POA: Diagnosis not present

## 2020-11-01 DIAGNOSIS — R109 Unspecified abdominal pain: Secondary | ICD-10-CM

## 2020-11-01 DIAGNOSIS — R350 Frequency of micturition: Secondary | ICD-10-CM

## 2020-11-01 LAB — URINALYSIS, ROUTINE W REFLEX MICROSCOPIC
Glucose, UA: NEGATIVE mg/dL
Ketones, ur: NEGATIVE mg/dL
Leukocytes,Ua: NEGATIVE
Nitrite: POSITIVE — AB
Protein, ur: NEGATIVE mg/dL
Specific Gravity, Urine: 1.019 (ref 1.005–1.030)
pH: 6 (ref 5.0–8.0)

## 2020-11-01 MED ORDER — CEPHALEXIN 500 MG PO CAPS: 500.0000 mg | ORAL_CAPSULE | Freq: Two times a day (BID) | ORAL | 0 refills | Status: AC

## 2020-11-01 MED ORDER — TRAMADOL HCL 50 MG PO TABS: 50.0000 mg | ORAL_TABLET | Freq: Four times a day (QID) | ORAL | 0 refills | Status: DC | PRN

## 2020-11-01 NOTE — ED Triage Notes (Signed)
Pt has history of interstitial Cystitis. Symptoms started about a week and half ago. Urinary frequency and burning
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
## 2020-11-01 NOTE — Discharge Instructions (Signed)
Your history, exam, and diagnostic results today are consistent with urinary tract infection.  Please take the antibiotics to treat again.  Please take the pain medicine to help with the discomfort from the UTI.  Based on your reassuring evaluation otherwise, we agreed to hold on more extensive work-up today unless you take the antibiotics and pain medicine at home to follow-up with your primary doctor.  If any symptoms change or worsen acutely, please return to the nearest emergency department.  Please rest and stay hydrated.

## 2020-11-01 NOTE — ED Notes (Signed)
Pt has been under severe stress during past 3 weeks and reports a flare ofher IC. Pt reports peeing every 5 minutes,small amounts and has constant feeling of the need to urinate.

## 2020-11-02 LAB — URINE CULTURE: Culture: <10000 — AB

## 2020-11-23 ENCOUNTER — Other Ambulatory Visit: Payer: Self-pay

## 2020-11-23 ENCOUNTER — Encounter (HOSPITAL_BASED_OUTPATIENT_CLINIC_OR_DEPARTMENT_OTHER): Payer: Self-pay | Admitting: Emergency Medicine

## 2020-11-23 ENCOUNTER — Emergency Department (HOSPITAL_BASED_OUTPATIENT_CLINIC_OR_DEPARTMENT_OTHER)
Admission: EM | Admit: 2020-11-23 | Discharge: 2020-11-23 | Disposition: A | Discharge status: Home / Self Care | Payer: Medicare Other | Attending: Emergency Medicine | Admitting: Emergency Medicine

## 2020-11-23 DIAGNOSIS — Z79899 Other long term (current) drug therapy: Secondary | ICD-10-CM | POA: Diagnosis not present

## 2020-11-23 DIAGNOSIS — I10 Essential (primary) hypertension: Secondary | ICD-10-CM | POA: Insufficient documentation

## 2020-11-23 DIAGNOSIS — H00036 Abscess of eyelid left eye, unspecified eyelid: Secondary | ICD-10-CM

## 2020-11-23 DIAGNOSIS — H05012 Cellulitis of left orbit: Secondary | ICD-10-CM | POA: Diagnosis not present

## 2020-11-23 DIAGNOSIS — Z87891 Personal history of nicotine dependence: Secondary | ICD-10-CM | POA: Insufficient documentation

## 2020-11-23 MED ORDER — CIPROFLOXACIN HCL 0.3 % OP OINT: TOPICAL_OINTMENT | OPHTHALMIC | 0 refills | Status: DC
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
## 2020-11-23 NOTE — ED Triage Notes (Signed)
Pt arrives to ED with c/o of pain to her lower eyelid. Pt states she's had a growth/cyst to the eyelid for the past x2 weeks and continues to grow. Pt states the eyelid is red and inflamed.

## 2020-11-23 NOTE — ED Notes (Signed)
Patient verbalizes understanding of discharge instructions. Opportunity for questioning and answers were provided. Armband removed by staff, pt discharged from ED.  

## 2020-11-29 ENCOUNTER — Other Ambulatory Visit: Payer: Self-pay

## 2020-11-29 ENCOUNTER — Emergency Department (HOSPITAL_BASED_OUTPATIENT_CLINIC_OR_DEPARTMENT_OTHER)
Admission: EM | Admit: 2020-11-29 | Discharge: 2020-11-29 | Disposition: A | Discharge status: Home / Self Care | Payer: Medicare Other | Attending: Emergency Medicine | Admitting: Emergency Medicine

## 2020-11-29 ENCOUNTER — Encounter (HOSPITAL_BASED_OUTPATIENT_CLINIC_OR_DEPARTMENT_OTHER): Payer: Self-pay

## 2020-11-29 DIAGNOSIS — I10 Essential (primary) hypertension: Secondary | ICD-10-CM | POA: Diagnosis not present

## 2020-11-29 DIAGNOSIS — Z87891 Personal history of nicotine dependence: Secondary | ICD-10-CM | POA: Diagnosis not present

## 2020-11-29 DIAGNOSIS — Z20822 Contact with and (suspected) exposure to covid-19: Secondary | ICD-10-CM | POA: Diagnosis not present

## 2020-11-29 DIAGNOSIS — Z79899 Other long term (current) drug therapy: Secondary | ICD-10-CM | POA: Diagnosis not present

## 2020-11-29 DIAGNOSIS — R0981 Nasal congestion: Secondary | ICD-10-CM | POA: Diagnosis present

## 2020-11-29 DIAGNOSIS — J029 Acute pharyngitis, unspecified: Secondary | ICD-10-CM | POA: Diagnosis not present

## 2020-11-29 MED ORDER — IBUPROFEN 400 MG PO TABS
400.0000 mg | ORAL_TABLET | Freq: Once | ORAL | Status: AC
  Administered 2020-11-29: 400 mg via ORAL
  Filled 2020-11-29: qty 1

## 2020-11-29 NOTE — ED Triage Notes (Signed)
Pt is present to the ED for a sore throat and runny nose that started this morning. Pt has used OTC nasal spray and throat spray with no relief of sx.

## 2020-11-29 NOTE — ED Notes (Signed)
Pt verbalizes understanding of discharge instructions. Opportunity for questioning and answers were provided. Armand removed by staff, pt discharged from ED to home. Educated to Designer, industrial/product for COVID results.

## 2020-11-30 LAB — SARS CORONAVIRUS 2 (TAT 6-24 HRS): SARS Coronavirus 2: NEGATIVE
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
## 2020-12-10 ENCOUNTER — Emergency Department (HOSPITAL_BASED_OUTPATIENT_CLINIC_OR_DEPARTMENT_OTHER): Payer: Medicare Other | Admitting: Radiology

## 2020-12-10 ENCOUNTER — Other Ambulatory Visit: Payer: Self-pay

## 2020-12-10 ENCOUNTER — Emergency Department (HOSPITAL_BASED_OUTPATIENT_CLINIC_OR_DEPARTMENT_OTHER)
Admission: EM | Admit: 2020-12-10 | Discharge: 2020-12-10 | Disposition: A | Discharge status: Home / Self Care | Payer: Medicare Other | Attending: Emergency Medicine | Admitting: Emergency Medicine

## 2020-12-10 ENCOUNTER — Encounter (HOSPITAL_BASED_OUTPATIENT_CLINIC_OR_DEPARTMENT_OTHER): Payer: Self-pay | Admitting: *Deleted

## 2020-12-10 DIAGNOSIS — Z79899 Other long term (current) drug therapy: Secondary | ICD-10-CM | POA: Insufficient documentation

## 2020-12-10 DIAGNOSIS — Z87891 Personal history of nicotine dependence: Secondary | ICD-10-CM | POA: Insufficient documentation

## 2020-12-10 DIAGNOSIS — R059 Cough, unspecified: Secondary | ICD-10-CM

## 2020-12-10 DIAGNOSIS — I10 Essential (primary) hypertension: Secondary | ICD-10-CM | POA: Diagnosis not present

## 2020-12-10 MED ORDER — PREDNISONE 50 MG PO TABS
60.0000 mg | ORAL_TABLET | Freq: Once | ORAL | Status: AC
  Administered 2020-12-10: 60 mg via ORAL
  Filled 2020-12-10: qty 1

## 2020-12-10 MED ORDER — PREDNISONE 10 MG PO TABS: 60.0000 mg | ORAL_TABLET | Freq: Every day | ORAL | 0 refills | Status: AC

## 2020-12-10 MED ORDER — ALBUTEROL SULFATE HFA 108 (90 BASE) MCG/ACT IN AERS: 1.0000 | INHALATION_SPRAY | Freq: Four times a day (QID) | RESPIRATORY_TRACT | 1 refills | Status: DC | PRN

## 2020-12-10 NOTE — ED Notes (Signed)
Pt updated due to delays
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
## 2020-12-10 NOTE — ED Triage Notes (Signed)
Was seen on May 21st for sore throat.  Came in today for cough and shortness of breath.

## 2021-01-04 ENCOUNTER — Encounter (HOSPITAL_BASED_OUTPATIENT_CLINIC_OR_DEPARTMENT_OTHER): Payer: Self-pay

## 2021-01-04 ENCOUNTER — Emergency Department (HOSPITAL_BASED_OUTPATIENT_CLINIC_OR_DEPARTMENT_OTHER): Payer: Medicare Other

## 2021-01-04 ENCOUNTER — Other Ambulatory Visit: Payer: Self-pay

## 2021-01-04 ENCOUNTER — Emergency Department (HOSPITAL_BASED_OUTPATIENT_CLINIC_OR_DEPARTMENT_OTHER)
Admission: EM | Admit: 2021-01-04 | Discharge: 2021-01-04 | Disposition: A | Discharge status: Home / Self Care | Payer: Medicare Other | Attending: Emergency Medicine | Admitting: Emergency Medicine

## 2021-01-04 DIAGNOSIS — R110 Nausea: Secondary | ICD-10-CM | POA: Diagnosis not present

## 2021-01-04 DIAGNOSIS — I10 Essential (primary) hypertension: Secondary | ICD-10-CM | POA: Diagnosis not present

## 2021-01-04 DIAGNOSIS — R197 Diarrhea, unspecified: Secondary | ICD-10-CM | POA: Diagnosis not present

## 2021-01-04 DIAGNOSIS — Z79899 Other long term (current) drug therapy: Secondary | ICD-10-CM | POA: Diagnosis not present

## 2021-01-04 DIAGNOSIS — Z87891 Personal history of nicotine dependence: Secondary | ICD-10-CM | POA: Insufficient documentation

## 2021-01-04 DIAGNOSIS — R300 Dysuria: Secondary | ICD-10-CM

## 2021-01-04 DIAGNOSIS — R109 Unspecified abdominal pain: Secondary | ICD-10-CM | POA: Insufficient documentation

## 2021-01-04 LAB — CBC WITH DIFFERENTIAL/PLATELET
Abs Immature Granulocytes: 0.01 10*3/uL (ref 0.00–0.07)
Basophils Absolute: 0 10*3/uL (ref 0.0–0.1)
Basophils Relative: 1 %
Eosinophils Absolute: 0.5 10*3/uL (ref 0.0–0.5)
Eosinophils Relative: 12 %
HCT: 41.3 % (ref 36.0–46.0)
Hemoglobin: 13.2 g/dL (ref 12.0–15.0)
Immature Granulocytes: 0 %
Lymphocytes Relative: 37 %
Lymphs Abs: 1.7 10*3/uL (ref 0.7–4.0)
MCH: 28.2 pg (ref 26.0–34.0)
MCHC: 32 g/dL (ref 30.0–36.0)
MCV: 88.2 fL (ref 80.0–100.0)
Monocytes Absolute: 0.3 10*3/uL (ref 0.1–1.0)
Monocytes Relative: 7 %
Neutro Abs: 1.9 10*3/uL (ref 1.7–7.7)
Neutrophils Relative %: 43 %
Platelets: 231 10*3/uL (ref 150–400)
RBC: 4.68 MIL/uL (ref 3.87–5.11)
RDW: 13.5 % (ref 11.5–15.5)
WBC: 4.4 10*3/uL (ref 4.0–10.5)
nRBC: 0 % (ref 0.0–0.2)

## 2021-01-04 LAB — URINALYSIS, ROUTINE W REFLEX MICROSCOPIC
Glucose, UA: NEGATIVE mg/dL
Hgb urine dipstick: NEGATIVE
Ketones, ur: NEGATIVE mg/dL
Leukocytes,Ua: NEGATIVE
Nitrite: POSITIVE — AB
Specific Gravity, Urine: 1.026 (ref 1.005–1.030)
pH: 6 (ref 5.0–8.0)

## 2021-01-04 LAB — WET PREP, GENITAL
Clue Cells Wet Prep HPF POC: NONE SEEN
Sperm: NONE SEEN
Trich, Wet Prep: NONE SEEN
WBC, Wet Prep HPF POC: NONE SEEN
Yeast Wet Prep HPF POC: NONE SEEN

## 2021-01-04 LAB — COMPREHENSIVE METABOLIC PANEL
ALT: 27 U/L (ref 0–44)
AST: 24 U/L (ref 15–41)
Albumin: 4.9 g/dL (ref 3.5–5.0)
Alkaline Phosphatase: 93 U/L (ref 38–126)
Anion gap: 9 (ref 5–15)
BUN: 14 mg/dL (ref 6–20)
CO2: 29 mmol/L (ref 22–32)
Calcium: 9.6 mg/dL (ref 8.9–10.3)
Chloride: 104 mmol/L (ref 98–111)
Creatinine, Ser: 0.91 mg/dL (ref 0.44–1.00)
GFR, Estimated: >60 mL/min (ref >60)
Glucose, Bld: 103 mg/dL — ABNORMAL HIGH (ref 70–99)
Potassium: 3.6 mmol/L (ref 3.5–5.1)
Sodium: 142 mmol/L (ref 135–145)
Total Bilirubin: 0.3 mg/dL (ref 0.3–1.2)
Total Protein: 7 g/dL (ref 6.5–8.1)

## 2021-01-04 MED ORDER — ONDANSETRON HCL 4 MG/2ML IJ SOLN
4.0000 mg | Freq: Once | INTRAMUSCULAR | Status: AC
  Administered 2021-01-04: 4 mg via INTRAVENOUS
  Filled 2021-01-04: qty 2

## 2021-01-04 MED ORDER — SODIUM CHLORIDE 0.9 % IV BOLUS
1000.0000 mL | Freq: Once | INTRAVENOUS | Status: AC
  Administered 2021-01-04: 1000 mL via INTRAVENOUS

## 2021-01-04 MED ORDER — CEPHALEXIN 250 MG PO CAPS
250.0000 mg | ORAL_CAPSULE | Freq: Once | ORAL | Status: AC
  Administered 2021-01-04: 250 mg via ORAL
  Filled 2021-01-04: qty 1

## 2021-01-04 MED ORDER — KETOROLAC TROMETHAMINE 30 MG/ML IJ SOLN
30.0000 mg | Freq: Once | INTRAMUSCULAR | Status: AC
  Administered 2021-01-04: 30 mg via INTRAVENOUS
  Filled 2021-01-04: qty 1

## 2021-01-04 MED ORDER — CEPHALEXIN 500 MG PO CAPS: 500.0000 mg | ORAL_CAPSULE | Freq: Four times a day (QID) | ORAL | 0 refills | Status: DC
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
## 2021-01-04 NOTE — ED Notes (Signed)
ED Provider at bedside. 

## 2021-01-04 NOTE — ED Notes (Signed)
Pelvic Cart at bedside 

## 2021-01-04 NOTE — ED Triage Notes (Signed)
She states she is having dysuria and some mild low back/bilat. Flank area discomfort x 2-3 days. She mentions a recent visit to a mall, where she voided "and the toilet there was nasty". She is ambulatory and in no distress. She denies fever. She also had had some diarrhea 3 days ago, however, that has resolved.

## 2021-01-04 NOTE — ED Notes (Signed)
Patient unable to provide urine sample at this time.  Will continue to monitor.

## 2021-01-05 LAB — GC/CHLAMYDIA PROBE AMP (~~LOC~~) NOT AT ARMC
Chlamydia: NEGATIVE
Comment: NEGATIVE
Comment: NORMAL
Neisseria Gonorrhea: NEGATIVE

## 2021-01-19 ENCOUNTER — Other Ambulatory Visit: Payer: Self-pay

## 2021-01-19 ENCOUNTER — Emergency Department (HOSPITAL_BASED_OUTPATIENT_CLINIC_OR_DEPARTMENT_OTHER)
Admission: EM | Admit: 2021-01-19 | Discharge: 2021-01-20 | Disposition: A | Discharge status: Home / Self Care | Payer: Medicare (Managed Care) | Attending: Emergency Medicine | Admitting: Emergency Medicine

## 2021-01-19 DIAGNOSIS — G43909 Migraine, unspecified, not intractable, without status migrainosus: Secondary | ICD-10-CM | POA: Insufficient documentation

## 2021-01-19 DIAGNOSIS — G43809 Other migraine, not intractable, without status migrainosus: Secondary | ICD-10-CM

## 2021-01-19 DIAGNOSIS — I10 Essential (primary) hypertension: Secondary | ICD-10-CM | POA: Diagnosis not present

## 2021-01-19 DIAGNOSIS — N3090 Cystitis, unspecified without hematuria: Secondary | ICD-10-CM | POA: Diagnosis not present

## 2021-01-19 DIAGNOSIS — Z87891 Personal history of nicotine dependence: Secondary | ICD-10-CM | POA: Insufficient documentation

## 2021-01-19 DIAGNOSIS — Z79899 Other long term (current) drug therapy: Secondary | ICD-10-CM | POA: Insufficient documentation

## 2021-01-19 LAB — URINALYSIS, ROUTINE W REFLEX MICROSCOPIC
Bilirubin Urine: NEGATIVE
Glucose, UA: NEGATIVE mg/dL
Ketones, ur: NEGATIVE mg/dL
Leukocytes,Ua: NEGATIVE
Nitrite: NEGATIVE
Protein, ur: NEGATIVE mg/dL
Specific Gravity, Urine: 1.025 (ref 1.005–1.030)
pH: 5.5 (ref 5.0–8.0)

## 2021-01-19 MED ORDER — DIPHENHYDRAMINE HCL 50 MG/ML IJ SOLN
25.0000 mg | Freq: Once | INTRAMUSCULAR | Status: AC
  Administered 2021-01-20: 25 mg via INTRAVENOUS
  Filled 2021-01-19: qty 1

## 2021-01-19 MED ORDER — KETOROLAC TROMETHAMINE 30 MG/ML IJ SOLN
30.0000 mg | Freq: Once | INTRAMUSCULAR | Status: AC
  Administered 2021-01-20: 30 mg via INTRAVENOUS
  Filled 2021-01-19: qty 1

## 2021-01-19 MED ORDER — DEXAMETHASONE SODIUM PHOSPHATE 10 MG/ML IJ SOLN
10.0000 mg | Freq: Once | INTRAMUSCULAR | Status: AC
  Administered 2021-01-20: 10 mg via INTRAVENOUS
  Filled 2021-01-19: qty 1

## 2021-01-19 MED ORDER — SODIUM CHLORIDE 0.9 % IV BOLUS
1000.0000 mL | Freq: Once | INTRAVENOUS | Status: AC
  Administered 2021-01-20: 1000 mL via INTRAVENOUS

## 2021-01-19 MED ORDER — METOCLOPRAMIDE HCL 5 MG/ML IJ SOLN
10.0000 mg | Freq: Once | INTRAMUSCULAR | Status: AC
  Administered 2021-01-20: 10 mg via INTRAVENOUS
  Filled 2021-01-19: qty 2

## 2021-01-19 NOTE — ED Triage Notes (Signed)
Patient reports she is having an interstitial cystitis flare. Patient states she is also having a migraine.
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
## 2021-01-20 DIAGNOSIS — G43909 Migraine, unspecified, not intractable, without status migrainosus: Secondary | ICD-10-CM | POA: Diagnosis not present

## 2021-01-20 LAB — BASIC METABOLIC PANEL
Anion gap: 14 (ref 5–15)
BUN: 21 mg/dL — ABNORMAL HIGH (ref 6–20)
CO2: 24 mmol/L (ref 22–32)
Calcium: 10.7 mg/dL — ABNORMAL HIGH (ref 8.9–10.3)
Chloride: 100 mmol/L (ref 98–111)
Creatinine, Ser: 0.81 mg/dL (ref 0.44–1.00)
GFR, Estimated: >60 mL/min (ref >60)
Glucose, Bld: 90 mg/dL (ref 70–99)
Potassium: 4 mmol/L (ref 3.5–5.1)
Sodium: 138 mmol/L (ref 135–145)

## 2021-01-20 LAB — CBC WITH DIFFERENTIAL/PLATELET
Abs Immature Granulocytes: 0.01 10*3/uL (ref 0.00–0.07)
Basophils Absolute: 0 10*3/uL (ref 0.0–0.1)
Basophils Relative: 1 %
Eosinophils Absolute: 0.3 10*3/uL (ref 0.0–0.5)
Eosinophils Relative: 4 %
HCT: 39 % (ref 36.0–46.0)
Hemoglobin: 12.8 g/dL (ref 12.0–15.0)
Immature Granulocytes: 0 %
Lymphocytes Relative: 36 %
Lymphs Abs: 2.5 10*3/uL (ref 0.7–4.0)
MCH: 28.5 pg (ref 26.0–34.0)
MCHC: 32.8 g/dL (ref 30.0–36.0)
MCV: 86.9 fL (ref 80.0–100.0)
Monocytes Absolute: 0.5 10*3/uL (ref 0.1–1.0)
Monocytes Relative: 7 %
Neutro Abs: 3.6 10*3/uL (ref 1.7–7.7)
Neutrophils Relative %: 52 %
Platelets: 275 10*3/uL (ref 150–400)
RBC: 4.49 MIL/uL (ref 3.87–5.11)
RDW: 12.8 % (ref 11.5–15.5)
WBC: 6.9 10*3/uL (ref 4.0–10.5)
nRBC: 0 % (ref 0.0–0.2)

## 2021-01-20 NOTE — Discharge Instructions (Addendum)
Continue medications as previously prescribed.  Return to the emergency department for any new and/or concerning symptoms. 

## 2021-01-27 ENCOUNTER — Other Ambulatory Visit: Payer: Self-pay

## 2021-01-27 ENCOUNTER — Emergency Department (HOSPITAL_BASED_OUTPATIENT_CLINIC_OR_DEPARTMENT_OTHER): Payer: Medicare (Managed Care)

## 2021-01-27 ENCOUNTER — Emergency Department (HOSPITAL_BASED_OUTPATIENT_CLINIC_OR_DEPARTMENT_OTHER)
Admission: EM | Admit: 2021-01-27 | Discharge: 2021-01-28 | Disposition: A | Discharge status: Home / Self Care | Payer: Medicare (Managed Care) | Attending: Emergency Medicine | Admitting: Emergency Medicine

## 2021-01-27 ENCOUNTER — Encounter (HOSPITAL_BASED_OUTPATIENT_CLINIC_OR_DEPARTMENT_OTHER): Payer: Self-pay

## 2021-01-27 DIAGNOSIS — M542 Cervicalgia: Secondary | ICD-10-CM | POA: Diagnosis present

## 2021-01-27 DIAGNOSIS — M62838 Other muscle spasm: Secondary | ICD-10-CM | POA: Diagnosis not present

## 2021-01-27 DIAGNOSIS — R072 Precordial pain: Secondary | ICD-10-CM | POA: Insufficient documentation

## 2021-01-27 DIAGNOSIS — I10 Essential (primary) hypertension: Secondary | ICD-10-CM | POA: Insufficient documentation

## 2021-01-27 DIAGNOSIS — R791 Abnormal coagulation profile: Secondary | ICD-10-CM | POA: Diagnosis not present

## 2021-01-27 DIAGNOSIS — Z87891 Personal history of nicotine dependence: Secondary | ICD-10-CM | POA: Diagnosis not present

## 2021-01-27 DIAGNOSIS — R202 Paresthesia of skin: Secondary | ICD-10-CM | POA: Insufficient documentation

## 2021-01-27 LAB — COMPREHENSIVE METABOLIC PANEL
ALT: 31 U/L (ref 0–44)
AST: 23 U/L (ref 15–41)
Albumin: 4.6 g/dL (ref 3.5–5.0)
Alkaline Phosphatase: 92 U/L (ref 38–126)
Anion gap: 10 (ref 5–15)
BUN: 16 mg/dL (ref 6–20)
CO2: 26 mmol/L (ref 22–32)
Calcium: 9.5 mg/dL (ref 8.9–10.3)
Chloride: 105 mmol/L (ref 98–111)
Creatinine, Ser: 0.84 mg/dL (ref 0.44–1.00)
GFR, Estimated: >60 mL/min (ref >60)
Glucose, Bld: 129 mg/dL — ABNORMAL HIGH (ref 70–99)
Potassium: 3.2 mmol/L — ABNORMAL LOW (ref 3.5–5.1)
Sodium: 141 mmol/L (ref 135–145)
Total Bilirubin: 0.4 mg/dL (ref 0.3–1.2)
Total Protein: 7.3 g/dL (ref 6.5–8.1)

## 2021-01-27 LAB — PROTIME-INR
INR: 1 (ref 0.8–1.2)
Prothrombin Time: 12.8 seconds (ref 11.4–15.2)

## 2021-01-27 LAB — DIFFERENTIAL
Abs Immature Granulocytes: 0.03 10*3/uL (ref 0.00–0.07)
Basophils Absolute: 0 10*3/uL (ref 0.0–0.1)
Basophils Relative: 0 %
Eosinophils Absolute: 0.5 10*3/uL (ref 0.0–0.5)
Eosinophils Relative: 6 %
Immature Granulocytes: 0 %
Lymphocytes Relative: 31 %
Lymphs Abs: 2.5 10*3/uL (ref 0.7–4.0)
Monocytes Absolute: 0.6 10*3/uL (ref 0.1–1.0)
Monocytes Relative: 7 %
Neutro Abs: 4.4 10*3/uL (ref 1.7–7.7)
Neutrophils Relative %: 56 %

## 2021-01-27 LAB — CBC
HCT: 39.4 % (ref 36.0–46.0)
Hemoglobin: 12.9 g/dL (ref 12.0–15.0)
MCH: 28.6 pg (ref 26.0–34.0)
MCHC: 32.7 g/dL (ref 30.0–36.0)
MCV: 87.4 fL (ref 80.0–100.0)
Platelets: 288 10*3/uL (ref 150–400)
RBC: 4.51 MIL/uL (ref 3.87–5.11)
RDW: 12.6 % (ref 11.5–15.5)
WBC: 8 10*3/uL (ref 4.0–10.5)
nRBC: 0 % (ref 0.0–0.2)

## 2021-01-27 LAB — CBG MONITORING, ED: Glucose-Capillary: 131 mg/dL — ABNORMAL HIGH (ref 70–99)

## 2021-01-27 LAB — APTT: aPTT: 30 seconds (ref 24–36)

## 2021-01-27 MED ORDER — SODIUM CHLORIDE 0.9% FLUSH
3.0000 mL | Freq: Once | INTRAVENOUS | Status: DC
  Filled 2021-01-27: qty 3

## 2021-01-27 NOTE — ED Triage Notes (Signed)
Patient here POV from Home with Neck Pain, Headache, and Hyperthyroidism.  Patient states she was at PCP earlier for same Symptoms. EKG was completed at PCP.  Ambulatory, GCS 15. No Neurological Deficits noted during Triage. NIH 0.

## 2021-01-27 NOTE — ED Notes (Signed)
MD Horton made aware of Patient and their Symptoms. MD states to place Standing Orders per Protocol.   LSN: 01/14/21.

## 2021-01-28 DIAGNOSIS — M62838 Other muscle spasm: Secondary | ICD-10-CM | POA: Diagnosis not present

## 2021-01-28 LAB — TROPONIN I (HIGH SENSITIVITY): Troponin I (High Sensitivity): 2 ng/L (ref <18)

## 2021-01-28 MED ORDER — METHOCARBAMOL 1000 MG/10ML IJ SOLN
500.0000 mg | Freq: Once | INTRAMUSCULAR | Status: AC
  Administered 2021-01-28: 500 mg via INTRAMUSCULAR
  Filled 2021-01-28: qty 10

## 2021-01-28 MED ORDER — SODIUM CHLORIDE 0.9 % IV BOLUS
500.0000 mL | Freq: Once | INTRAVENOUS | Status: AC
  Administered 2021-01-28: 500 mL via INTRAVENOUS

## 2021-01-28 MED ORDER — METHOCARBAMOL 500 MG PO TABS: 500.0000 mg | ORAL_TABLET | Freq: Three times a day (TID) | ORAL | 0 refills | Status: AC | PRN

## 2021-01-28 MED ORDER — METHOCARBAMOL 500 MG PO TABS
500.0000 mg | ORAL_TABLET | Freq: Three times a day (TID) | ORAL | 0 refills | Status: DC | PRN
Start: 2021-01-28 — End: 2021-01-28
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
## 2021-01-28 NOTE — Discharge Instructions (Addendum)
Please coordinate with your PCP and Psychiatry team regarding your symptoms and referral to the endocrinology team. Return with any new or suddenly worsening symptoms.

## 2021-03-26 ENCOUNTER — Emergency Department (HOSPITAL_COMMUNITY)
Admission: EM | Admit: 2021-03-26 | Discharge: 2021-03-26 | Disposition: A | Discharge status: Home / Self Care | Payer: Medicare (Managed Care) | Attending: Physician Assistant | Admitting: Physician Assistant

## 2021-03-26 ENCOUNTER — Encounter (HOSPITAL_COMMUNITY): Payer: Self-pay

## 2021-03-26 ENCOUNTER — Other Ambulatory Visit: Payer: Self-pay

## 2021-03-26 ENCOUNTER — Ambulatory Visit (HOSPITAL_COMMUNITY): Admission: RE | Admit: 2021-03-26 | Payer: Medicare (Managed Care) | Source: Ambulatory Visit

## 2021-03-26 ENCOUNTER — Ambulatory Visit: Payer: Medicare Other | Admitting: Nurse Practitioner

## 2021-03-26 ENCOUNTER — Emergency Department (HOSPITAL_COMMUNITY): Payer: Medicare (Managed Care)

## 2021-03-26 DIAGNOSIS — R079 Chest pain, unspecified: Secondary | ICD-10-CM | POA: Diagnosis not present

## 2021-03-26 DIAGNOSIS — Z5321 Procedure and treatment not carried out due to patient leaving prior to being seen by health care provider: Secondary | ICD-10-CM | POA: Diagnosis not present

## 2021-03-26 DIAGNOSIS — I10 Essential (primary) hypertension: Secondary | ICD-10-CM | POA: Insufficient documentation

## 2021-03-26 DIAGNOSIS — R202 Paresthesia of skin: Secondary | ICD-10-CM | POA: Diagnosis not present

## 2021-03-26 LAB — COMPREHENSIVE METABOLIC PANEL
ALT: 29 U/L (ref 0–44)
AST: 31 U/L (ref 15–41)
Albumin: 4.9 g/dL (ref 3.5–5.0)
Alkaline Phosphatase: 88 U/L (ref 38–126)
Anion gap: 10 (ref 5–15)
BUN: 16 mg/dL (ref 6–20)
CO2: 26 mmol/L (ref 22–32)
Calcium: 9.9 mg/dL (ref 8.9–10.3)
Chloride: 102 mmol/L (ref 98–111)
Creatinine, Ser: 0.94 mg/dL (ref 0.44–1.00)
GFR, Estimated: >60 mL/min (ref >60)
Glucose, Bld: 99 mg/dL (ref 70–99)
Potassium: 3.7 mmol/L (ref 3.5–5.1)
Sodium: 138 mmol/L (ref 135–145)
Total Bilirubin: 0.6 mg/dL (ref 0.3–1.2)
Total Protein: 7.8 g/dL (ref 6.5–8.1)

## 2021-03-26 LAB — CBC WITH DIFFERENTIAL/PLATELET
Abs Immature Granulocytes: 0.02 10*3/uL (ref 0.00–0.07)
Basophils Absolute: 0 10*3/uL (ref 0.0–0.1)
Basophils Relative: 0 %
Eosinophils Absolute: 0.3 10*3/uL (ref 0.0–0.5)
Eosinophils Relative: 4 %
HCT: 44.1 % (ref 36.0–46.0)
Hemoglobin: 14.4 g/dL (ref 12.0–15.0)
Immature Granulocytes: 0 %
Lymphocytes Relative: 25 %
Lymphs Abs: 1.8 10*3/uL (ref 0.7–4.0)
MCH: 28.7 pg (ref 26.0–34.0)
MCHC: 32.7 g/dL (ref 30.0–36.0)
MCV: 87.8 fL (ref 80.0–100.0)
Monocytes Absolute: 0.5 10*3/uL (ref 0.1–1.0)
Monocytes Relative: 7 %
Neutro Abs: 4.6 10*3/uL (ref 1.7–7.7)
Neutrophils Relative %: 64 %
Platelets: 307 10*3/uL (ref 150–400)
RBC: 5.02 MIL/uL (ref 3.87–5.11)
RDW: 12.4 % (ref 11.5–15.5)
WBC: 7.2 10*3/uL (ref 4.0–10.5)
nRBC: 0 % (ref 0.0–0.2)

## 2021-03-26 LAB — TROPONIN I (HIGH SENSITIVITY): Troponin I (High Sensitivity): <2 ng/L (ref <18)

## 2021-03-26 NOTE — ED Provider Notes (Signed)
Emergency Medicine Provider Triage Evaluation Note  [name] , a 40 y.o. female  was evaluated in triage.  Pt complains of chest pain, she reports that she felt like she had cotton in her ears.  She went to the PCP and told them she had chest pain and with the hypertension.    She stopped taking her amlodipine about a week ago as she feels like it made her throat hurt more.  She feels like the Wellbutrin made everything worse.  She is not currently on any thyroid medications.    She reports feeling very anxious.   Chart review shows her last tsh was normal.   Review of Systems  Positive: Neck pain, chest pain, not taking BP meds,  Negative: Fevers  Physical Exam  BP (!) 150/105 (BP Location: Right Arm)   Pulse 76   Temp 98.7 F (37.1 C) (Oral)   Resp 16   Ht 5\' 2"  (1.575 m)   Wt 70.3 kg   LMP 09/27/2013   SpO2 100%   BMI 28.35 kg/m  Gen:   Awake, patient is very anxious, she is tearful. Resp:  Normal effort  MSK:   Moves extremities without difficulty  Other:  Normal phonation without difficulty.  Medical Decision Making  Medically screening exam initiated at 4:48 PM.  Appropriate orders placed.  [name] was informed that the remainder of the evaluation will be completed by another provider, this initial triage assessment does not replace that evaluation, and the importance of remaining in the ED until their evaluation is complete.  Patient is here for worsening chest pain and neck pain. She has multiple concerns and symptoms.  She feels like this is all related due to her being hyperthyroid, she is not on any thyroid medications, and her last TSH was normal. She took her self off her amlodipine about a week ago, based on this we will obtain EKG, basic labs. And cxr    Daphine Deutscher, PA-C 03/26/21 1658    03/28/21, MD 03/28/21 1147

## 2021-03-26 NOTE — ED Triage Notes (Signed)
Per EMS-history of hyperthyroidism-saw a new PCP today-has been seeing multiple doctors for her symptoms-has not seen an endocrinologist-appointment on the 21 st of this month-complaining of CP with has been diagnosed and noncardiac, tingling in her neck, cotton in her ears-multiple complaints

## 2021-03-30 ENCOUNTER — Encounter (HOSPITAL_BASED_OUTPATIENT_CLINIC_OR_DEPARTMENT_OTHER): Payer: Self-pay | Admitting: Emergency Medicine

## 2021-03-30 ENCOUNTER — Other Ambulatory Visit: Payer: Self-pay

## 2021-03-30 ENCOUNTER — Emergency Department (HOSPITAL_BASED_OUTPATIENT_CLINIC_OR_DEPARTMENT_OTHER)
Admission: EM | Admit: 2021-03-30 | Discharge: 2021-03-31 | Disposition: A | Discharge status: Home / Self Care | Payer: Medicare (Managed Care) | Attending: Emergency Medicine | Admitting: Emergency Medicine

## 2021-03-30 ENCOUNTER — Emergency Department (HOSPITAL_BASED_OUTPATIENT_CLINIC_OR_DEPARTMENT_OTHER): Payer: Medicare (Managed Care) | Admitting: Radiology

## 2021-03-30 DIAGNOSIS — F419 Anxiety disorder, unspecified: Secondary | ICD-10-CM | POA: Insufficient documentation

## 2021-03-30 DIAGNOSIS — Z87891 Personal history of nicotine dependence: Secondary | ICD-10-CM | POA: Insufficient documentation

## 2021-03-30 DIAGNOSIS — I10 Essential (primary) hypertension: Secondary | ICD-10-CM | POA: Insufficient documentation

## 2021-03-30 DIAGNOSIS — Z79899 Other long term (current) drug therapy: Secondary | ICD-10-CM | POA: Diagnosis not present

## 2021-03-30 DIAGNOSIS — M542 Cervicalgia: Secondary | ICD-10-CM | POA: Insufficient documentation

## 2021-03-30 LAB — PREGNANCY, URINE: Preg Test, Ur: NEGATIVE

## 2021-03-30 LAB — CBC
HCT: 42.1 % (ref 36.0–46.0)
Hemoglobin: 14.2 g/dL (ref 12.0–15.0)
MCH: 28.7 pg (ref 26.0–34.0)
MCHC: 33.7 g/dL (ref 30.0–36.0)
MCV: 85.1 fL (ref 80.0–100.0)
Platelets: 299 10*3/uL (ref 150–400)
RBC: 4.95 MIL/uL (ref 3.87–5.11)
RDW: 12.3 % (ref 11.5–15.5)
WBC: 7 10*3/uL (ref 4.0–10.5)
nRBC: 0 % (ref 0.0–0.2)

## 2021-03-30 NOTE — ED Triage Notes (Addendum)
Pt presents to ED POV. Pt c/o HTN. Pt reports that she took herself off bp meds d/t thinking it was hurting her thyroid. Reports restarting meds on thurs. Later c/o CP

## 2021-03-30 NOTE — ED Notes (Signed)
Attempted to stick for blood, unsuccessful

## 2021-03-31 DIAGNOSIS — I10 Essential (primary) hypertension: Secondary | ICD-10-CM | POA: Diagnosis not present

## 2021-03-31 LAB — BASIC METABOLIC PANEL
Anion gap: 13 (ref 5–15)
BUN: 17 mg/dL (ref 6–20)
CO2: 26 mmol/L (ref 22–32)
Calcium: 10.2 mg/dL (ref 8.9–10.3)
Chloride: 102 mmol/L (ref 98–111)
Creatinine, Ser: 0.72 mg/dL (ref 0.44–1.00)
GFR, Estimated: >60 mL/min (ref >60)
Glucose, Bld: 87 mg/dL (ref 70–99)
Potassium: 3.7 mmol/L (ref 3.5–5.1)
Sodium: 141 mmol/L (ref 135–145)

## 2021-03-31 LAB — TROPONIN I (HIGH SENSITIVITY): Troponin I (High Sensitivity): 3 ng/L (ref <18)

## 2021-03-31 MED ORDER — LORAZEPAM 1 MG PO TABS
1.0000 mg | ORAL_TABLET | Freq: Once | ORAL | Status: AC
  Administered 2021-03-31: 1 mg via ORAL
  Filled 2021-03-31: qty 1

## 2021-03-31 NOTE — Discharge Instructions (Addendum)
You were seen today with concerns for high blood pressure.  It is important that you take your blood pressure medications as prescribed.  Your work-up is overall reassuring.  Your heart testing is normal and your EKG is normal.  Follow-up with your primary physician.
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
## 2021-04-02 ENCOUNTER — Other Ambulatory Visit: Payer: Self-pay | Admitting: Family Medicine

## 2021-04-02 ENCOUNTER — Ambulatory Visit
Admission: RE | Admit: 2021-04-02 | Discharge: 2021-04-02 | Disposition: A | Discharge status: Home / Self Care | Payer: Medicare (Managed Care) | Source: Ambulatory Visit | Attending: Family Medicine | Admitting: Family Medicine

## 2021-04-02 DIAGNOSIS — M542 Cervicalgia: Secondary | ICD-10-CM

## 2021-04-03 ENCOUNTER — Other Ambulatory Visit: Payer: Self-pay | Admitting: Family Medicine

## 2021-04-15 ENCOUNTER — Other Ambulatory Visit: Payer: Self-pay

## 2021-04-15 ENCOUNTER — Ambulatory Visit (INDEPENDENT_AMBULATORY_CARE_PROVIDER_SITE_OTHER): Payer: Medicare (Managed Care) | Admitting: Nurse Practitioner

## 2021-04-15 ENCOUNTER — Encounter: Payer: Self-pay | Admitting: Nurse Practitioner

## 2021-04-15 VITALS — BP 128/80 | Ht 62.0 in | Wt 156.0 lb

## 2021-04-15 DIAGNOSIS — Z01419 Encounter for gynecological examination (general) (routine) without abnormal findings: Secondary | ICD-10-CM

## 2021-04-15 DIAGNOSIS — Z780 Asymptomatic menopausal state: Secondary | ICD-10-CM

## 2021-04-15 NOTE — Progress Notes (Signed)
   [name] 03-20-1981 [id number]   History:  40 y.o. G0 presents for breast and pelvic exam. No GYN complaints. Postmenopausal - no HRT. S/P 2015 TAH LSO for benign cyst and suspected endometriosis, subsequent RSO for cyst. Normal pap history. Declines mammograms d/t mother having bad experience. History of HTN, bipolar disorder, migraine, IC. Low TSH in June, sees endocrinology this month.   Gynecologic History Patient's last menstrual period was 09/27/2013.   Contraception/Family planning: status post hysterectomy Sexually active: No  Health Maintenance Last Pap: 09/05/2018. Results were: Normal Last mammogram: Never Last colonoscopy: 2010. Results were: Normal Last Dexa: Not indicated  Past medical history, past surgical history, family history and social history were all reviewed and documented in the EPIC chart. Single. Living with mother. Disabled due to IC.   ROS:  A ROS was performed and pertinent positives and negatives are included.  Exam:  Vitals:   04/15/21 0800  BP: 128/80  Weight: 156 lb (70.8 kg)  Height: 5\' 2"  (1.575 m)   Body mass index is 28.53 kg/m.  General appearance:  Normal Thyroid:  Symmetrical, normal in size, without palpable masses or nodularity. Respiratory  Auscultation:  Clear without wheezing or rhonchi Cardiovascular  Auscultation:  Regular rate, without rubs, murmurs or gallops  Edema/varicosities:  Not grossly evident Abdominal  Soft,nontender, without masses, guarding or rebound.  Liver/spleen:  No organomegaly noted  Hernia:  None appreciated  Skin  Inspection:  Grossly normal Breasts: Examined lying and sitting.   Right: Without masses, retractions, nipple discharge or axillary adenopathy.   Left: Without masses, retractions, nipple discharge or axillary adenopathy. Genitourinary   Inguinal/mons:  Normal without inguinal adenopathy  External genitalia:  Normal appearing vulva with no masses, tenderness, or  lesions  BUS/Urethra/Skene's glands:  Normal  Vagina:  Normal appearing with normal color and discharge, no lesions  Cervix:  Absent  Uterus:  Absent  Anus and perineum: Normal  Digital rectal exam: Normal sphincter tone without palpated masses or tenderness  Patient informed chaperone available to be present for breast and pelvic exam. Patient has requested no chaperone to be present. Patient has been advised what will be completed during breast and pelvic exam.   Assessment/Plan:  40 y.o. G0 for breast and pelvic exam.   Well female exam with routine gynecological exam - Education provided on SBEs, importance of preventative screenings, current guidelines, high calcium diet, regular exercise, and multivitamin daily.  Labs with PCP.   Postmenopausal - no HRT. S/P 2015 TAH LSO for benign cyst and suspected endometriosis, subsequent RSO for cyst. Tried ERT but had IC flares with use that improved when discontinued.   Screening for cervical cancer - Normal Pap history. No longer screening per guidelines.   Screening for breast cancer - Has not had screening mammogram due to mother having bad experience years ago. Discussed importance of preventative screenings and she will consider doing this year. Normal breast exam today.  Screening for colon cancer - 2010 colonoscopy. Will repeat at GI's recommended interval.   Return in 1 year for annual.   2011 DNP, 8:17 AM 04/15/2021

## 2021-04-25 ENCOUNTER — Encounter (HOSPITAL_BASED_OUTPATIENT_CLINIC_OR_DEPARTMENT_OTHER): Payer: Self-pay | Admitting: Emergency Medicine

## 2021-04-25 ENCOUNTER — Emergency Department (HOSPITAL_BASED_OUTPATIENT_CLINIC_OR_DEPARTMENT_OTHER)
Admission: EM | Admit: 2021-04-25 | Discharge: 2021-04-25 | Disposition: A | Discharge status: Home / Self Care | Payer: Medicare (Managed Care) | Attending: Emergency Medicine | Admitting: Emergency Medicine

## 2021-04-25 ENCOUNTER — Other Ambulatory Visit: Payer: Self-pay

## 2021-04-25 DIAGNOSIS — I10 Essential (primary) hypertension: Secondary | ICD-10-CM | POA: Diagnosis not present

## 2021-04-25 DIAGNOSIS — Z79899 Other long term (current) drug therapy: Secondary | ICD-10-CM | POA: Diagnosis not present

## 2021-04-25 DIAGNOSIS — H9203 Otalgia, bilateral: Secondary | ICD-10-CM | POA: Insufficient documentation

## 2021-04-25 DIAGNOSIS — Z87891 Personal history of nicotine dependence: Secondary | ICD-10-CM | POA: Insufficient documentation

## 2021-04-25 MED ORDER — OXYCODONE-ACETAMINOPHEN 5-325 MG PO TABS
1.0000 | ORAL_TABLET | Freq: Once | ORAL | Status: AC
  Administered 2021-04-25: 1 via ORAL
  Filled 2021-04-25: qty 1

## 2021-04-25 NOTE — ED Notes (Signed)
ED Provider at bedside. 

## 2021-04-25 NOTE — Discharge Instructions (Addendum)
Continue with your antibiotics.  Finish your course of steroids.  Follow-up with an ear nose and throat doctor for further evaluation

## 2021-04-25 NOTE — ED Notes (Signed)
Pt discharged to home. Discharge instructions have been discussed with patient and/or family members. Pt verbally acknowledges understanding d/c instructions, and endorses comprehension to checkout at registration before leaving.  °

## 2021-04-25 NOTE — ED Triage Notes (Signed)
Pt arrives pov with c/o bilateral ear pain, worse on Left side. and HA. Pt reports decreased hearing and recent treatment with abx and prednisone. Pt endorses wanting pain medication . 400 mg at 1400 with no relief
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
## 2021-04-26 ENCOUNTER — Other Ambulatory Visit: Payer: Self-pay

## 2021-04-26 ENCOUNTER — Emergency Department (HOSPITAL_COMMUNITY)
Admission: EM | Admit: 2021-04-26 | Discharge: 2021-04-26 | Disposition: A | Discharge status: Home / Self Care | Payer: Medicare (Managed Care) | Attending: Emergency Medicine | Admitting: Emergency Medicine

## 2021-04-26 DIAGNOSIS — Z79899 Other long term (current) drug therapy: Secondary | ICD-10-CM | POA: Insufficient documentation

## 2021-04-26 DIAGNOSIS — Z87891 Personal history of nicotine dependence: Secondary | ICD-10-CM | POA: Diagnosis not present

## 2021-04-26 DIAGNOSIS — H9203 Otalgia, bilateral: Secondary | ICD-10-CM | POA: Insufficient documentation

## 2021-04-26 DIAGNOSIS — I10 Essential (primary) hypertension: Secondary | ICD-10-CM | POA: Diagnosis not present

## 2021-04-26 MED ORDER — LIDOCAINE HCL (PF) 2 % IJ SOLN
10.0000 mL | Freq: Once | INTRAMUSCULAR | Status: AC
  Administered 2021-04-26: 10 mL
  Filled 2021-04-26: qty 10

## 2021-04-26 MED ORDER — LORAZEPAM 1 MG PO TABS
1.0000 mg | ORAL_TABLET | Freq: Once | ORAL | Status: AC
  Administered 2021-04-26: 1 mg via ORAL
  Filled 2021-04-26: qty 1

## 2021-04-26 MED ORDER — LIDOCAINE HCL 2 % IJ SOLN
INTRAMUSCULAR | Status: AC
  Filled 2021-04-26: qty 20

## 2021-04-26 NOTE — ED Triage Notes (Signed)
Pt from home BIB EMS for bilateral ear pain.  Pt has been seen several times in the past few days for same.

## 2021-04-26 NOTE — ED Notes (Signed)
Initial assessment completed. Awaiting further orders. 
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
## 2021-07-29 IMAGING — CT CT NECK W/ CM
4 series · 15 of 33 positions shown, 18 images · IV contrast (omnipaque)
Comparison: None.

CLINICAL DATA: Sore throat

EXAM:
CT NECK WITH CONTRAST
TECHNIQUE: Multidetector CT imaging of the neck was performed using the
standard protocol following the bolus administration of intravenous
contrast.
CONTRAST:  75mL OMNIPAQUE IOHEXOL 300 MG/ML  SOLN

[Series 3: axial neck · axial · 0.44mm/px · z∈[-251,-99]mm · 5 of 116 slices shown, 7 images]
[im 20/116  soft-tissue]
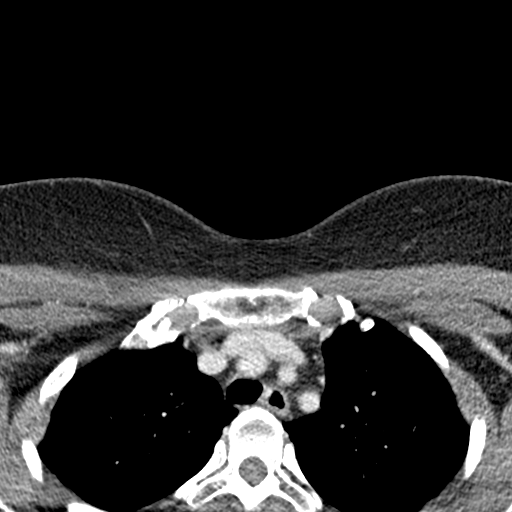
[im 20/116  bone]
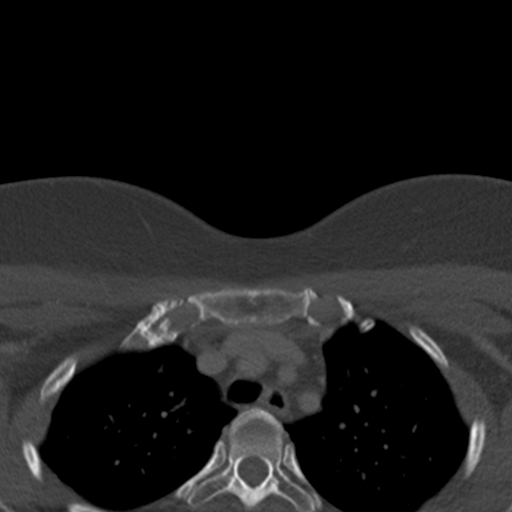
[im 39/116  bone]
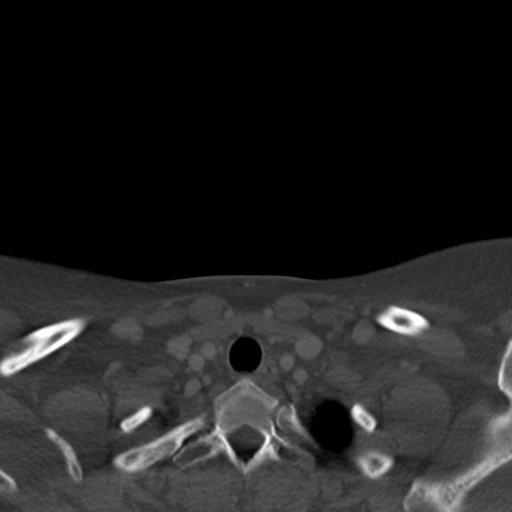
[im 58/116  bone]
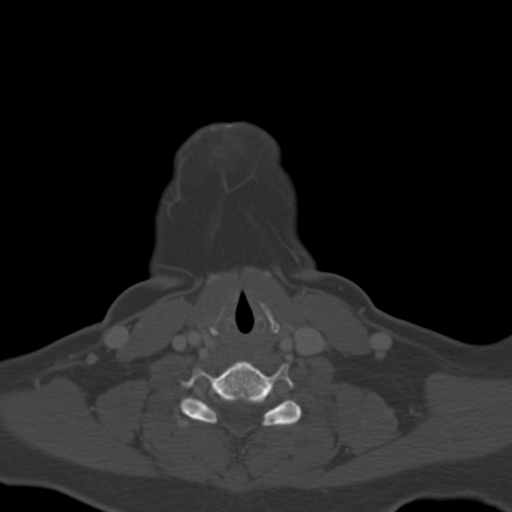
[im 77/116  bone]
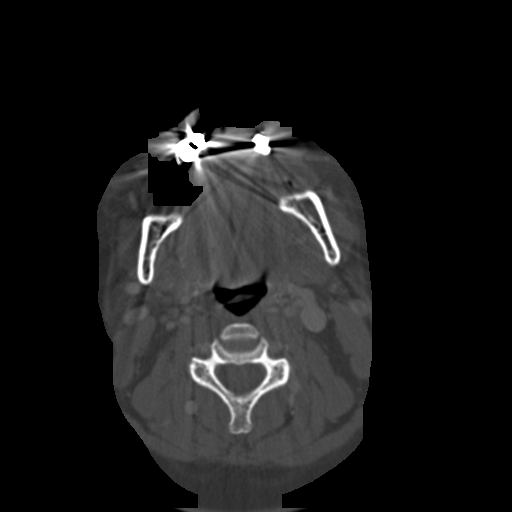
[im 96/116  soft-tissue]
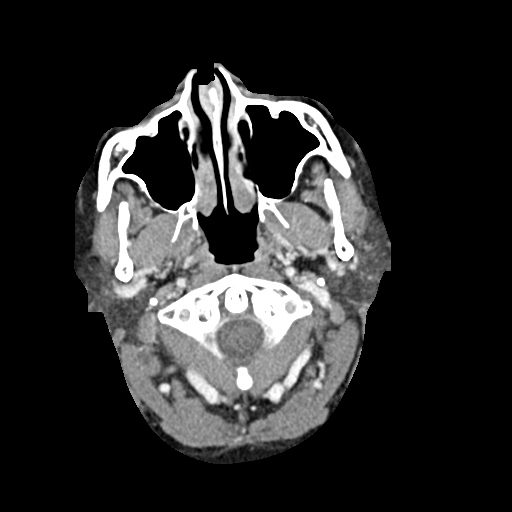
[im 96/116  bone]
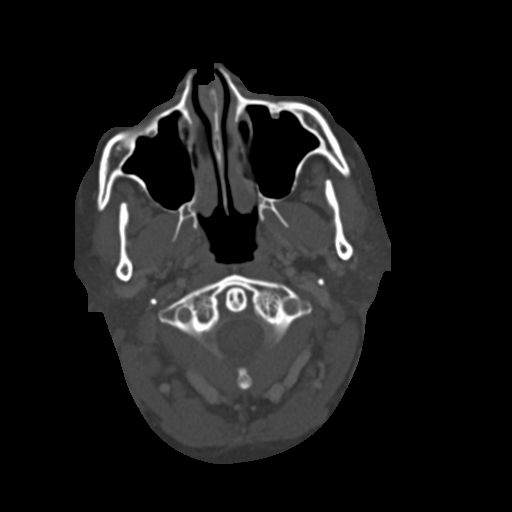

[Series 6: axial · axial · 0.39mm/px · z∈[-251,-213]mm · 2 of 116 slices shown]
[im 20/116  bone]
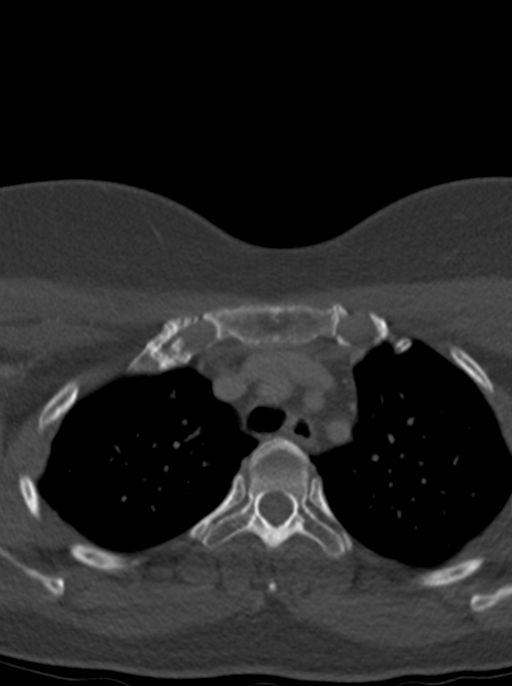
[im 39/116  bone]
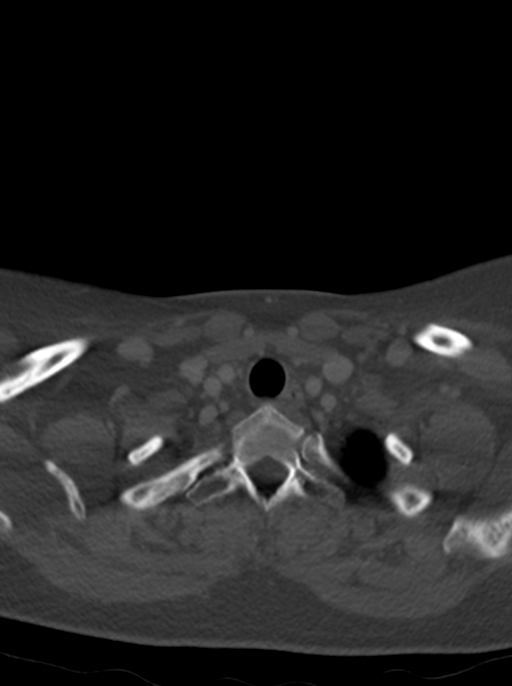

[Series 7: coronal · coronal · 0.46mm/px · 3 of 127 slices shown]
[im 26/127  bone]
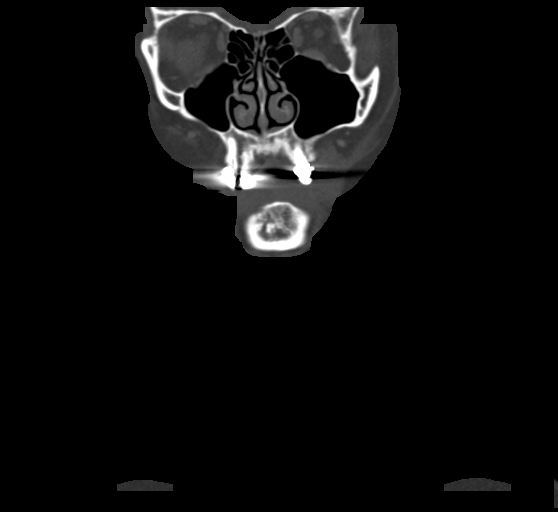
[im 51/127  bone]
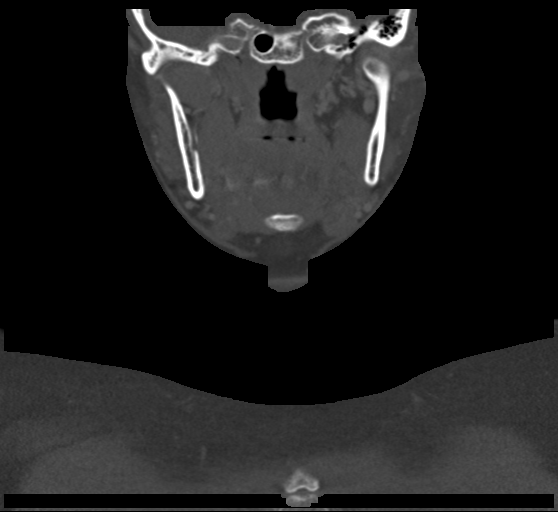
[im 76/127  bone]
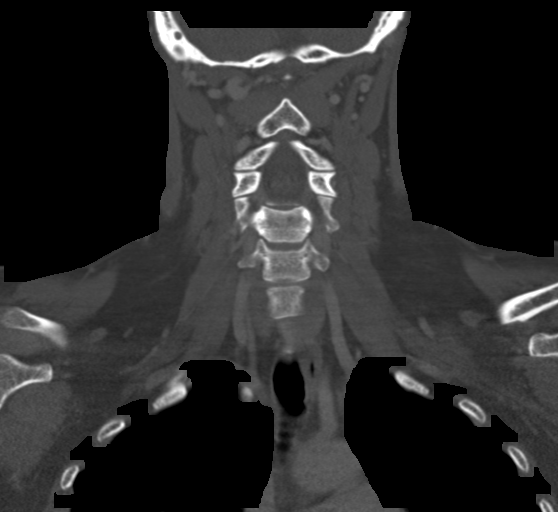

[Series 8: sagittal · sagittal · 0.49mm/px · 5 of 105 slices shown, 6 images]
[im 35/105  bone]
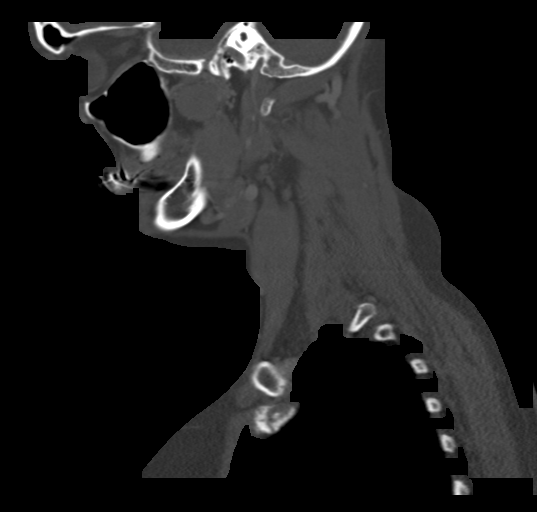
[im 44/105  bone]
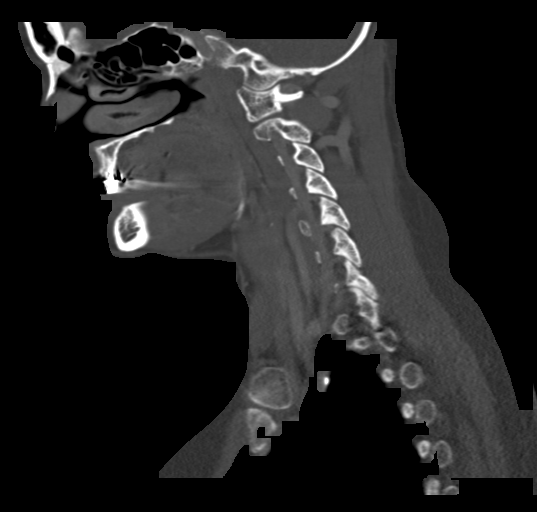
[im 53/105  soft-tissue]
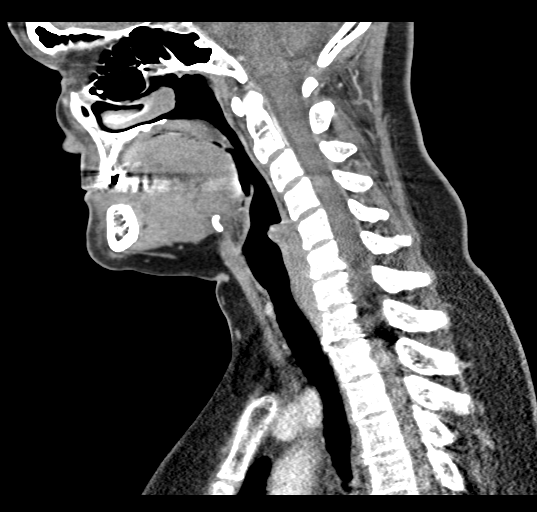
[im 53/105  bone]
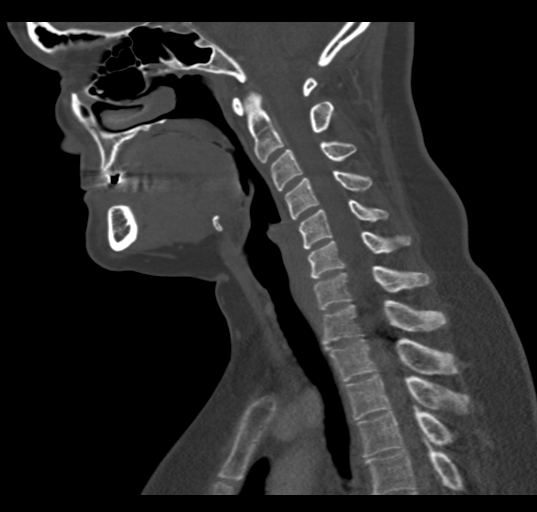
[im 61/105  bone]
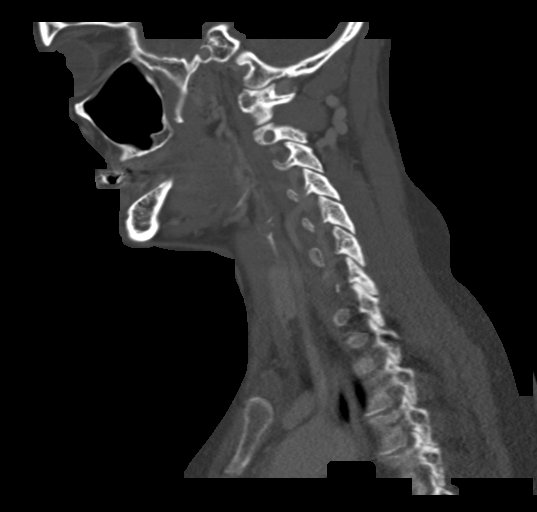
[im 70/105  bone]
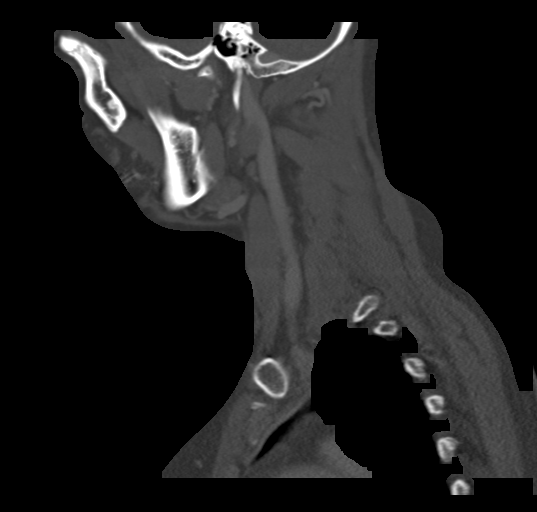

[15 of 33 positions shown; findings below may reference images not displayed]

FINDINGS: Pharynx and larynx: Unremarkable. No mass or swelling. No
parapharyngeal inflammatory changes identified.

Salivary glands: Unremarkable.

Thyroid: Normal.

Lymph nodes: No enlarged or abnormal density nodes identified.

Vascular: Major neck vessels are patent.

Limited intracranial: No abnormal enhancement.

Visualized orbits: Unremarkable.

Mastoids and visualized paranasal sinuses: Minor mucosal thickening.
Mastoid air cells are clear.

Skeleton: Minor degenerative changes of the cervical spine.

Upper chest: Included upper lungs are clear.

Other: None.
IMPRESSION: No significant inflammatory changes.  No mass or adenopathy.

## 2021-10-25 ENCOUNTER — Emergency Department (HOSPITAL_BASED_OUTPATIENT_CLINIC_OR_DEPARTMENT_OTHER)
Admission: EM | Admit: 2021-10-25 | Discharge: 2021-10-25 | Disposition: A | Discharge status: Home / Self Care | Payer: Medicare (Managed Care) | Attending: Emergency Medicine | Admitting: Emergency Medicine

## 2021-10-25 ENCOUNTER — Other Ambulatory Visit: Payer: Self-pay

## 2021-10-25 ENCOUNTER — Encounter (HOSPITAL_BASED_OUTPATIENT_CLINIC_OR_DEPARTMENT_OTHER): Payer: Self-pay | Admitting: Emergency Medicine

## 2021-10-25 DIAGNOSIS — Z5321 Procedure and treatment not carried out due to patient leaving prior to being seen by health care provider: Secondary | ICD-10-CM | POA: Insufficient documentation

## 2021-10-25 DIAGNOSIS — W228XXA Striking against or struck by other objects, initial encounter: Secondary | ICD-10-CM | POA: Diagnosis not present

## 2021-10-25 DIAGNOSIS — M79601 Pain in right arm: Secondary | ICD-10-CM | POA: Insufficient documentation

## 2021-10-25 NOTE — ED Triage Notes (Signed)
Pt c/o right arm pain for few days. Pain started when pt put a pillow on top of closet and hit wrist on part of the closet.  ?

## 2022-01-28 ENCOUNTER — Encounter (HOSPITAL_BASED_OUTPATIENT_CLINIC_OR_DEPARTMENT_OTHER): Payer: Self-pay

## 2022-01-28 ENCOUNTER — Emergency Department (HOSPITAL_BASED_OUTPATIENT_CLINIC_OR_DEPARTMENT_OTHER)
Admission: EM | Admit: 2022-01-28 | Discharge: 2022-01-28 | Discharge status: Against Medical Advice | Payer: Medicare (Managed Care) | Attending: Emergency Medicine | Admitting: Emergency Medicine

## 2022-01-28 DIAGNOSIS — Z5321 Procedure and treatment not carried out due to patient leaving prior to being seen by health care provider: Secondary | ICD-10-CM | POA: Diagnosis not present

## 2022-01-28 DIAGNOSIS — R079 Chest pain, unspecified: Secondary | ICD-10-CM | POA: Diagnosis present

## 2022-01-28 NOTE — ED Triage Notes (Signed)
Pt injured wrist 2 1/2 months ago Dx with thrombophlebitis 01/21/22 PCP requests and [place] of rt upper extremity Also c/o CP hx of GERD & anxiety

## 2022-11-24 ENCOUNTER — Ambulatory Visit (INDEPENDENT_AMBULATORY_CARE_PROVIDER_SITE_OTHER): Payer: Medicare (Managed Care) | Admitting: Nurse Practitioner

## 2022-11-24 VITALS — BP 102/74

## 2022-11-24 DIAGNOSIS — K6289 Other specified diseases of anus and rectum: Secondary | ICD-10-CM | POA: Diagnosis not present

## 2022-11-24 NOTE — Progress Notes (Signed)
   Acute Office Visit  Subjective:    Patient ID: [name], female    DOB: [birth year], 42 y.o.   MRN: [id number]   HPI 42 y.o. G0 presents today for possible cyst. Noticed it a few weeks ago, non-tender, unsure if it has changed in size, no drainage or redness. H/O cysts requiring incision and drainage in the past and is worried she needs that again.    Patient's last menstrual period was 09/27/2013.    Review of Systems  Constitutional: Negative.   Skin:        Cyst-like structure near anus       Objective:    Physical Exam Constitutional:      Appearance: Normal appearance.  Genitourinary:      BP 102/74 (BP Location: Left Arm, Patient Position: Sitting, Cuff Size: Normal)   LMP 09/27/2013  Wt Readings from Last 3 Encounters:  01/28/22 149 lb (67.6 kg)  10/25/21 153 lb (69.4 kg)  04/25/21 155 lb (70.3 kg)        Patient informed chaperone available to be present for breast and/or pelvic exam. Patient has requested no chaperone to be present. Patient has been advised what will be completed during breast and pelvic exam.   Assessment & Plan:   Problem List Items Addressed This Visit   None Visit Diagnoses     Perianal cyst    -  Primary      Plan: Possible cyst only felt with very deep palpation. Non-infectious. Reassurance provided. Will monitor. Warm compresses as needed.      Olivia Mackie DNP, 2:24 PM 11/24/2022
